# Patient Record
Sex: Male | Born: 1940
Health system: Southern US, Community
[De-identification: ages and names within clinical notes are randomized; demographics above are authoritative.]

## PROBLEM LIST (undated history)

## (undated) DIAGNOSIS — E11621 Type 2 diabetes mellitus with foot ulcer: Secondary | ICD-10-CM

## (undated) DIAGNOSIS — D509 Iron deficiency anemia, unspecified: Secondary | ICD-10-CM

## (undated) DIAGNOSIS — L97509 Non-pressure chronic ulcer of other part of unspecified foot with unspecified severity: Secondary | ICD-10-CM

## (undated) DIAGNOSIS — I10 Essential (primary) hypertension: Secondary | ICD-10-CM

## (undated) DIAGNOSIS — E119 Type 2 diabetes mellitus without complications: Secondary | ICD-10-CM

## (undated) DIAGNOSIS — K635 Polyp of colon: Secondary | ICD-10-CM

## (undated) DIAGNOSIS — D519 Vitamin B12 deficiency anemia, unspecified: Secondary | ICD-10-CM

## (undated) DIAGNOSIS — E785 Hyperlipidemia, unspecified: Secondary | ICD-10-CM

## (undated) DIAGNOSIS — K5790 Diverticulosis of intestine, part unspecified, without perforation or abscess without bleeding: Secondary | ICD-10-CM

## (undated) HISTORY — DX: Vitamin B12 deficiency anemia, unspecified: D51.9

## (undated) HISTORY — DX: Hyperlipidemia, unspecified: E78.5

## (undated) HISTORY — DX: Polyp of colon: K63.5

## (undated) HISTORY — DX: Type 2 diabetes mellitus with foot ulcer: L97.509

## (undated) HISTORY — DX: Essential (primary) hypertension: I10

## (undated) HISTORY — PX: COLONOSCOPY: SHX174

## (undated) HISTORY — DX: Iron deficiency anemia, unspecified: D50.9

## (undated) HISTORY — PX: TONSILLECTOMY: SUR1361

## (undated) HISTORY — DX: Diverticulosis of intestine, part unspecified, without perforation or abscess without bleeding: K57.90

## (undated) HISTORY — DX: Type 2 diabetes mellitus with foot ulcer: E11.621

## (undated) HISTORY — DX: Type 2 diabetes mellitus without complications: E11.9

---

## 2011-12-09 DIAGNOSIS — D485 Neoplasm of uncertain behavior of skin: Secondary | ICD-10-CM | POA: Diagnosis not present

## 2012-02-20 DIAGNOSIS — E785 Hyperlipidemia, unspecified: Secondary | ICD-10-CM | POA: Diagnosis not present

## 2012-02-20 DIAGNOSIS — I10 Essential (primary) hypertension: Secondary | ICD-10-CM | POA: Diagnosis not present

## 2012-02-20 DIAGNOSIS — Z6829 Body mass index (BMI) 29.0-29.9, adult: Secondary | ICD-10-CM | POA: Diagnosis not present

## 2012-02-20 DIAGNOSIS — Z79899 Other long term (current) drug therapy: Secondary | ICD-10-CM | POA: Diagnosis not present

## 2012-02-20 DIAGNOSIS — N529 Male erectile dysfunction, unspecified: Secondary | ICD-10-CM | POA: Diagnosis not present

## 2012-05-23 DIAGNOSIS — I10 Essential (primary) hypertension: Secondary | ICD-10-CM | POA: Diagnosis not present

## 2012-05-23 DIAGNOSIS — E785 Hyperlipidemia, unspecified: Secondary | ICD-10-CM | POA: Diagnosis not present

## 2012-05-23 DIAGNOSIS — Z6829 Body mass index (BMI) 29.0-29.9, adult: Secondary | ICD-10-CM | POA: Diagnosis not present

## 2012-05-24 DIAGNOSIS — L570 Actinic keratosis: Secondary | ICD-10-CM | POA: Diagnosis not present

## 2012-08-16 DIAGNOSIS — Z23 Encounter for immunization: Secondary | ICD-10-CM | POA: Diagnosis not present

## 2012-09-11 DIAGNOSIS — I10 Essential (primary) hypertension: Secondary | ICD-10-CM | POA: Diagnosis not present

## 2012-09-11 DIAGNOSIS — E785 Hyperlipidemia, unspecified: Secondary | ICD-10-CM | POA: Diagnosis not present

## 2012-09-11 DIAGNOSIS — Z6829 Body mass index (BMI) 29.0-29.9, adult: Secondary | ICD-10-CM | POA: Diagnosis not present

## 2012-09-18 DIAGNOSIS — Z6829 Body mass index (BMI) 29.0-29.9, adult: Secondary | ICD-10-CM | POA: Diagnosis not present

## 2013-01-07 DIAGNOSIS — Z9181 History of falling: Secondary | ICD-10-CM | POA: Diagnosis not present

## 2013-01-07 DIAGNOSIS — E785 Hyperlipidemia, unspecified: Secondary | ICD-10-CM | POA: Diagnosis not present

## 2013-01-07 DIAGNOSIS — I10 Essential (primary) hypertension: Secondary | ICD-10-CM | POA: Diagnosis not present

## 2013-01-07 DIAGNOSIS — Z1331 Encounter for screening for depression: Secondary | ICD-10-CM | POA: Diagnosis not present

## 2013-02-19 DIAGNOSIS — D235 Other benign neoplasm of skin of trunk: Secondary | ICD-10-CM | POA: Diagnosis not present

## 2013-02-19 DIAGNOSIS — L821 Other seborrheic keratosis: Secondary | ICD-10-CM | POA: Diagnosis not present

## 2013-02-19 DIAGNOSIS — L570 Actinic keratosis: Secondary | ICD-10-CM | POA: Diagnosis not present

## 2013-03-25 DIAGNOSIS — Z86010 Personal history of colonic polyps: Secondary | ICD-10-CM | POA: Diagnosis not present

## 2013-04-25 DIAGNOSIS — Z79899 Other long term (current) drug therapy: Secondary | ICD-10-CM | POA: Diagnosis not present

## 2013-04-25 DIAGNOSIS — I10 Essential (primary) hypertension: Secondary | ICD-10-CM | POA: Diagnosis not present

## 2013-04-25 DIAGNOSIS — E7800 Pure hypercholesterolemia, unspecified: Secondary | ICD-10-CM | POA: Diagnosis not present

## 2013-04-25 DIAGNOSIS — Z86010 Personal history of colonic polyps: Secondary | ICD-10-CM | POA: Diagnosis not present

## 2013-04-25 DIAGNOSIS — E119 Type 2 diabetes mellitus without complications: Secondary | ICD-10-CM | POA: Diagnosis not present

## 2013-04-25 DIAGNOSIS — Z7982 Long term (current) use of aspirin: Secondary | ICD-10-CM | POA: Diagnosis not present

## 2013-04-25 DIAGNOSIS — Z1211 Encounter for screening for malignant neoplasm of colon: Secondary | ICD-10-CM | POA: Diagnosis not present

## 2013-04-30 DIAGNOSIS — E785 Hyperlipidemia, unspecified: Secondary | ICD-10-CM | POA: Diagnosis not present

## 2013-04-30 DIAGNOSIS — Z1331 Encounter for screening for depression: Secondary | ICD-10-CM | POA: Diagnosis not present

## 2013-04-30 DIAGNOSIS — I10 Essential (primary) hypertension: Secondary | ICD-10-CM | POA: Diagnosis not present

## 2013-04-30 DIAGNOSIS — Z9181 History of falling: Secondary | ICD-10-CM | POA: Diagnosis not present

## 2013-08-08 DIAGNOSIS — I10 Essential (primary) hypertension: Secondary | ICD-10-CM | POA: Diagnosis not present

## 2013-08-08 DIAGNOSIS — Z6828 Body mass index (BMI) 28.0-28.9, adult: Secondary | ICD-10-CM | POA: Diagnosis not present

## 2013-08-08 DIAGNOSIS — E785 Hyperlipidemia, unspecified: Secondary | ICD-10-CM | POA: Diagnosis not present

## 2013-09-11 DIAGNOSIS — Z23 Encounter for immunization: Secondary | ICD-10-CM | POA: Diagnosis not present

## 2013-11-11 DIAGNOSIS — Z125 Encounter for screening for malignant neoplasm of prostate: Secondary | ICD-10-CM | POA: Diagnosis not present

## 2013-11-11 DIAGNOSIS — E785 Hyperlipidemia, unspecified: Secondary | ICD-10-CM | POA: Diagnosis not present

## 2013-11-11 DIAGNOSIS — I10 Essential (primary) hypertension: Secondary | ICD-10-CM | POA: Diagnosis not present

## 2014-02-10 DIAGNOSIS — IMO0001 Reserved for inherently not codable concepts without codable children: Secondary | ICD-10-CM | POA: Diagnosis not present

## 2014-02-10 DIAGNOSIS — I10 Essential (primary) hypertension: Secondary | ICD-10-CM | POA: Diagnosis not present

## 2014-02-10 DIAGNOSIS — E785 Hyperlipidemia, unspecified: Secondary | ICD-10-CM | POA: Diagnosis not present

## 2014-02-13 DIAGNOSIS — IMO0001 Reserved for inherently not codable concepts without codable children: Secondary | ICD-10-CM | POA: Diagnosis not present

## 2014-02-13 DIAGNOSIS — E785 Hyperlipidemia, unspecified: Secondary | ICD-10-CM | POA: Diagnosis not present

## 2014-02-13 DIAGNOSIS — I10 Essential (primary) hypertension: Secondary | ICD-10-CM | POA: Diagnosis not present

## 2014-02-13 DIAGNOSIS — Z6828 Body mass index (BMI) 28.0-28.9, adult: Secondary | ICD-10-CM | POA: Diagnosis not present

## 2014-02-27 DIAGNOSIS — I10 Essential (primary) hypertension: Secondary | ICD-10-CM | POA: Diagnosis not present

## 2014-02-27 DIAGNOSIS — IMO0001 Reserved for inherently not codable concepts without codable children: Secondary | ICD-10-CM | POA: Diagnosis not present

## 2014-02-27 DIAGNOSIS — Z6828 Body mass index (BMI) 28.0-28.9, adult: Secondary | ICD-10-CM | POA: Diagnosis not present

## 2014-02-27 DIAGNOSIS — E785 Hyperlipidemia, unspecified: Secondary | ICD-10-CM | POA: Diagnosis not present

## 2014-02-28 DIAGNOSIS — L821 Other seborrheic keratosis: Secondary | ICD-10-CM | POA: Diagnosis not present

## 2014-02-28 DIAGNOSIS — L570 Actinic keratosis: Secondary | ICD-10-CM | POA: Diagnosis not present

## 2014-05-21 DIAGNOSIS — E1149 Type 2 diabetes mellitus with other diabetic neurological complication: Secondary | ICD-10-CM | POA: Diagnosis not present

## 2014-05-21 DIAGNOSIS — E785 Hyperlipidemia, unspecified: Secondary | ICD-10-CM | POA: Diagnosis not present

## 2014-05-26 DIAGNOSIS — Z6828 Body mass index (BMI) 28.0-28.9, adult: Secondary | ICD-10-CM | POA: Diagnosis not present

## 2014-05-26 DIAGNOSIS — E785 Hyperlipidemia, unspecified: Secondary | ICD-10-CM | POA: Diagnosis not present

## 2014-05-26 DIAGNOSIS — Z9181 History of falling: Secondary | ICD-10-CM | POA: Diagnosis not present

## 2014-05-26 DIAGNOSIS — I10 Essential (primary) hypertension: Secondary | ICD-10-CM | POA: Diagnosis not present

## 2014-05-26 DIAGNOSIS — E1149 Type 2 diabetes mellitus with other diabetic neurological complication: Secondary | ICD-10-CM | POA: Diagnosis not present

## 2014-05-26 DIAGNOSIS — Z1331 Encounter for screening for depression: Secondary | ICD-10-CM | POA: Diagnosis not present

## 2014-08-28 DIAGNOSIS — E1165 Type 2 diabetes mellitus with hyperglycemia: Secondary | ICD-10-CM | POA: Diagnosis not present

## 2014-08-28 DIAGNOSIS — E119 Type 2 diabetes mellitus without complications: Secondary | ICD-10-CM | POA: Diagnosis not present

## 2014-08-28 DIAGNOSIS — E785 Hyperlipidemia, unspecified: Secondary | ICD-10-CM | POA: Diagnosis not present

## 2014-09-01 DIAGNOSIS — Z23 Encounter for immunization: Secondary | ICD-10-CM | POA: Diagnosis not present

## 2014-09-01 DIAGNOSIS — E1140 Type 2 diabetes mellitus with diabetic neuropathy, unspecified: Secondary | ICD-10-CM | POA: Diagnosis not present

## 2014-09-01 DIAGNOSIS — E785 Hyperlipidemia, unspecified: Secondary | ICD-10-CM | POA: Diagnosis not present

## 2014-09-01 DIAGNOSIS — Z6827 Body mass index (BMI) 27.0-27.9, adult: Secondary | ICD-10-CM | POA: Diagnosis not present

## 2014-09-01 DIAGNOSIS — I10 Essential (primary) hypertension: Secondary | ICD-10-CM | POA: Diagnosis not present

## 2014-12-09 DIAGNOSIS — E1140 Type 2 diabetes mellitus with diabetic neuropathy, unspecified: Secondary | ICD-10-CM | POA: Diagnosis not present

## 2014-12-09 DIAGNOSIS — Z125 Encounter for screening for malignant neoplasm of prostate: Secondary | ICD-10-CM | POA: Diagnosis not present

## 2014-12-09 DIAGNOSIS — E785 Hyperlipidemia, unspecified: Secondary | ICD-10-CM | POA: Diagnosis not present

## 2014-12-11 DIAGNOSIS — Z23 Encounter for immunization: Secondary | ICD-10-CM | POA: Diagnosis not present

## 2014-12-11 DIAGNOSIS — E785 Hyperlipidemia, unspecified: Secondary | ICD-10-CM | POA: Diagnosis not present

## 2014-12-11 DIAGNOSIS — Z760 Encounter for issue of repeat prescription: Secondary | ICD-10-CM | POA: Diagnosis not present

## 2014-12-11 DIAGNOSIS — E1140 Type 2 diabetes mellitus with diabetic neuropathy, unspecified: Secondary | ICD-10-CM | POA: Diagnosis not present

## 2014-12-11 DIAGNOSIS — Z6828 Body mass index (BMI) 28.0-28.9, adult: Secondary | ICD-10-CM | POA: Diagnosis not present

## 2014-12-11 DIAGNOSIS — I10 Essential (primary) hypertension: Secondary | ICD-10-CM | POA: Diagnosis not present

## 2015-03-03 DIAGNOSIS — L570 Actinic keratosis: Secondary | ICD-10-CM | POA: Diagnosis not present

## 2015-03-16 DIAGNOSIS — E785 Hyperlipidemia, unspecified: Secondary | ICD-10-CM | POA: Diagnosis not present

## 2015-03-16 DIAGNOSIS — E1140 Type 2 diabetes mellitus with diabetic neuropathy, unspecified: Secondary | ICD-10-CM | POA: Diagnosis not present

## 2015-03-18 DIAGNOSIS — E785 Hyperlipidemia, unspecified: Secondary | ICD-10-CM | POA: Diagnosis not present

## 2015-03-18 DIAGNOSIS — Z6828 Body mass index (BMI) 28.0-28.9, adult: Secondary | ICD-10-CM | POA: Diagnosis not present

## 2015-03-18 DIAGNOSIS — E1140 Type 2 diabetes mellitus with diabetic neuropathy, unspecified: Secondary | ICD-10-CM | POA: Diagnosis not present

## 2015-03-18 DIAGNOSIS — I10 Essential (primary) hypertension: Secondary | ICD-10-CM | POA: Diagnosis not present

## 2015-03-18 DIAGNOSIS — Z1389 Encounter for screening for other disorder: Secondary | ICD-10-CM | POA: Diagnosis not present

## 2015-06-23 DIAGNOSIS — E785 Hyperlipidemia, unspecified: Secondary | ICD-10-CM | POA: Diagnosis not present

## 2015-06-23 DIAGNOSIS — E1140 Type 2 diabetes mellitus with diabetic neuropathy, unspecified: Secondary | ICD-10-CM | POA: Diagnosis not present

## 2015-06-24 DIAGNOSIS — Z1389 Encounter for screening for other disorder: Secondary | ICD-10-CM | POA: Diagnosis not present

## 2015-06-24 DIAGNOSIS — E1140 Type 2 diabetes mellitus with diabetic neuropathy, unspecified: Secondary | ICD-10-CM | POA: Diagnosis not present

## 2015-06-24 DIAGNOSIS — I10 Essential (primary) hypertension: Secondary | ICD-10-CM | POA: Diagnosis not present

## 2015-06-24 DIAGNOSIS — Z9181 History of falling: Secondary | ICD-10-CM | POA: Diagnosis not present

## 2015-06-24 DIAGNOSIS — Z6828 Body mass index (BMI) 28.0-28.9, adult: Secondary | ICD-10-CM | POA: Diagnosis not present

## 2015-06-24 DIAGNOSIS — E785 Hyperlipidemia, unspecified: Secondary | ICD-10-CM | POA: Diagnosis not present

## 2015-09-28 DIAGNOSIS — E785 Hyperlipidemia, unspecified: Secondary | ICD-10-CM | POA: Diagnosis not present

## 2015-09-28 DIAGNOSIS — E1140 Type 2 diabetes mellitus with diabetic neuropathy, unspecified: Secondary | ICD-10-CM | POA: Diagnosis not present

## 2015-09-30 DIAGNOSIS — E785 Hyperlipidemia, unspecified: Secondary | ICD-10-CM | POA: Diagnosis not present

## 2015-09-30 DIAGNOSIS — E1140 Type 2 diabetes mellitus with diabetic neuropathy, unspecified: Secondary | ICD-10-CM | POA: Diagnosis not present

## 2015-09-30 DIAGNOSIS — Z6828 Body mass index (BMI) 28.0-28.9, adult: Secondary | ICD-10-CM | POA: Diagnosis not present

## 2015-09-30 DIAGNOSIS — Z23 Encounter for immunization: Secondary | ICD-10-CM | POA: Diagnosis not present

## 2015-09-30 DIAGNOSIS — I10 Essential (primary) hypertension: Secondary | ICD-10-CM | POA: Diagnosis not present

## 2016-01-05 DIAGNOSIS — E785 Hyperlipidemia, unspecified: Secondary | ICD-10-CM | POA: Diagnosis not present

## 2016-01-05 DIAGNOSIS — E1140 Type 2 diabetes mellitus with diabetic neuropathy, unspecified: Secondary | ICD-10-CM | POA: Diagnosis not present

## 2016-01-05 DIAGNOSIS — Z125 Encounter for screening for malignant neoplasm of prostate: Secondary | ICD-10-CM | POA: Diagnosis not present

## 2016-01-07 DIAGNOSIS — Z6828 Body mass index (BMI) 28.0-28.9, adult: Secondary | ICD-10-CM | POA: Diagnosis not present

## 2016-01-07 DIAGNOSIS — E663 Overweight: Secondary | ICD-10-CM | POA: Diagnosis not present

## 2016-01-07 DIAGNOSIS — I10 Essential (primary) hypertension: Secondary | ICD-10-CM | POA: Diagnosis not present

## 2016-01-07 DIAGNOSIS — E1140 Type 2 diabetes mellitus with diabetic neuropathy, unspecified: Secondary | ICD-10-CM | POA: Diagnosis not present

## 2016-01-07 DIAGNOSIS — E785 Hyperlipidemia, unspecified: Secondary | ICD-10-CM | POA: Diagnosis not present

## 2016-04-12 DIAGNOSIS — L570 Actinic keratosis: Secondary | ICD-10-CM | POA: Diagnosis not present

## 2016-04-12 DIAGNOSIS — E785 Hyperlipidemia, unspecified: Secondary | ICD-10-CM | POA: Diagnosis not present

## 2016-04-12 DIAGNOSIS — E1140 Type 2 diabetes mellitus with diabetic neuropathy, unspecified: Secondary | ICD-10-CM | POA: Diagnosis not present

## 2016-04-12 DIAGNOSIS — L821 Other seborrheic keratosis: Secondary | ICD-10-CM | POA: Diagnosis not present

## 2016-04-14 DIAGNOSIS — E785 Hyperlipidemia, unspecified: Secondary | ICD-10-CM | POA: Diagnosis not present

## 2016-04-14 DIAGNOSIS — E1140 Type 2 diabetes mellitus with diabetic neuropathy, unspecified: Secondary | ICD-10-CM | POA: Diagnosis not present

## 2016-04-14 DIAGNOSIS — I10 Essential (primary) hypertension: Secondary | ICD-10-CM | POA: Diagnosis not present

## 2016-04-14 DIAGNOSIS — Z6828 Body mass index (BMI) 28.0-28.9, adult: Secondary | ICD-10-CM | POA: Diagnosis not present

## 2016-07-19 DIAGNOSIS — E785 Hyperlipidemia, unspecified: Secondary | ICD-10-CM | POA: Diagnosis not present

## 2016-07-19 DIAGNOSIS — E1140 Type 2 diabetes mellitus with diabetic neuropathy, unspecified: Secondary | ICD-10-CM | POA: Diagnosis not present

## 2016-07-21 DIAGNOSIS — I10 Essential (primary) hypertension: Secondary | ICD-10-CM | POA: Diagnosis not present

## 2016-07-21 DIAGNOSIS — E785 Hyperlipidemia, unspecified: Secondary | ICD-10-CM | POA: Diagnosis not present

## 2016-07-21 DIAGNOSIS — Z9181 History of falling: Secondary | ICD-10-CM | POA: Diagnosis not present

## 2016-07-21 DIAGNOSIS — E1140 Type 2 diabetes mellitus with diabetic neuropathy, unspecified: Secondary | ICD-10-CM | POA: Diagnosis not present

## 2016-07-21 DIAGNOSIS — Z1389 Encounter for screening for other disorder: Secondary | ICD-10-CM | POA: Diagnosis not present

## 2016-07-25 DIAGNOSIS — Z23 Encounter for immunization: Secondary | ICD-10-CM | POA: Diagnosis not present

## 2016-10-24 DIAGNOSIS — E785 Hyperlipidemia, unspecified: Secondary | ICD-10-CM | POA: Diagnosis not present

## 2016-10-24 DIAGNOSIS — E1140 Type 2 diabetes mellitus with diabetic neuropathy, unspecified: Secondary | ICD-10-CM | POA: Diagnosis not present

## 2016-10-27 DIAGNOSIS — I10 Essential (primary) hypertension: Secondary | ICD-10-CM | POA: Diagnosis not present

## 2016-10-27 DIAGNOSIS — E1140 Type 2 diabetes mellitus with diabetic neuropathy, unspecified: Secondary | ICD-10-CM | POA: Diagnosis not present

## 2016-10-27 DIAGNOSIS — E785 Hyperlipidemia, unspecified: Secondary | ICD-10-CM | POA: Diagnosis not present

## 2016-12-12 DIAGNOSIS — Z125 Encounter for screening for malignant neoplasm of prostate: Secondary | ICD-10-CM | POA: Diagnosis not present

## 2016-12-12 DIAGNOSIS — Z136 Encounter for screening for cardiovascular disorders: Secondary | ICD-10-CM | POA: Diagnosis not present

## 2016-12-12 DIAGNOSIS — Z0000 Encounter for general adult medical examination without abnormal findings: Secondary | ICD-10-CM | POA: Diagnosis not present

## 2016-12-12 DIAGNOSIS — Z1211 Encounter for screening for malignant neoplasm of colon: Secondary | ICD-10-CM | POA: Diagnosis not present

## 2016-12-12 DIAGNOSIS — Z1389 Encounter for screening for other disorder: Secondary | ICD-10-CM | POA: Diagnosis not present

## 2016-12-12 DIAGNOSIS — Z9181 History of falling: Secondary | ICD-10-CM | POA: Diagnosis not present

## 2017-01-30 DIAGNOSIS — Z125 Encounter for screening for malignant neoplasm of prostate: Secondary | ICD-10-CM | POA: Diagnosis not present

## 2017-01-30 DIAGNOSIS — E1140 Type 2 diabetes mellitus with diabetic neuropathy, unspecified: Secondary | ICD-10-CM | POA: Diagnosis not present

## 2017-01-30 DIAGNOSIS — E785 Hyperlipidemia, unspecified: Secondary | ICD-10-CM | POA: Diagnosis not present

## 2017-02-02 DIAGNOSIS — I10 Essential (primary) hypertension: Secondary | ICD-10-CM | POA: Diagnosis not present

## 2017-02-02 DIAGNOSIS — Z6828 Body mass index (BMI) 28.0-28.9, adult: Secondary | ICD-10-CM | POA: Diagnosis not present

## 2017-02-02 DIAGNOSIS — E1140 Type 2 diabetes mellitus with diabetic neuropathy, unspecified: Secondary | ICD-10-CM | POA: Diagnosis not present

## 2017-02-02 DIAGNOSIS — E663 Overweight: Secondary | ICD-10-CM | POA: Diagnosis not present

## 2017-02-02 DIAGNOSIS — E785 Hyperlipidemia, unspecified: Secondary | ICD-10-CM | POA: Diagnosis not present

## 2017-04-12 DIAGNOSIS — L821 Other seborrheic keratosis: Secondary | ICD-10-CM | POA: Diagnosis not present

## 2017-04-12 DIAGNOSIS — L570 Actinic keratosis: Secondary | ICD-10-CM | POA: Diagnosis not present

## 2017-05-09 DIAGNOSIS — E1140 Type 2 diabetes mellitus with diabetic neuropathy, unspecified: Secondary | ICD-10-CM | POA: Diagnosis not present

## 2017-05-09 DIAGNOSIS — E785 Hyperlipidemia, unspecified: Secondary | ICD-10-CM | POA: Diagnosis not present

## 2017-05-11 DIAGNOSIS — E785 Hyperlipidemia, unspecified: Secondary | ICD-10-CM | POA: Diagnosis not present

## 2017-05-11 DIAGNOSIS — I10 Essential (primary) hypertension: Secondary | ICD-10-CM | POA: Diagnosis not present

## 2017-05-11 DIAGNOSIS — E1140 Type 2 diabetes mellitus with diabetic neuropathy, unspecified: Secondary | ICD-10-CM | POA: Diagnosis not present

## 2017-05-11 DIAGNOSIS — Z6828 Body mass index (BMI) 28.0-28.9, adult: Secondary | ICD-10-CM | POA: Diagnosis not present

## 2017-08-11 DIAGNOSIS — E1140 Type 2 diabetes mellitus with diabetic neuropathy, unspecified: Secondary | ICD-10-CM | POA: Diagnosis not present

## 2017-08-11 DIAGNOSIS — E785 Hyperlipidemia, unspecified: Secondary | ICD-10-CM | POA: Diagnosis not present

## 2017-08-15 DIAGNOSIS — Z9181 History of falling: Secondary | ICD-10-CM | POA: Diagnosis not present

## 2017-08-15 DIAGNOSIS — Z6827 Body mass index (BMI) 27.0-27.9, adult: Secondary | ICD-10-CM | POA: Diagnosis not present

## 2017-08-15 DIAGNOSIS — E785 Hyperlipidemia, unspecified: Secondary | ICD-10-CM | POA: Diagnosis not present

## 2017-08-15 DIAGNOSIS — E1140 Type 2 diabetes mellitus with diabetic neuropathy, unspecified: Secondary | ICD-10-CM | POA: Diagnosis not present

## 2017-08-15 DIAGNOSIS — I10 Essential (primary) hypertension: Secondary | ICD-10-CM | POA: Diagnosis not present

## 2017-08-17 DIAGNOSIS — Z23 Encounter for immunization: Secondary | ICD-10-CM | POA: Diagnosis not present

## 2017-11-15 DIAGNOSIS — E785 Hyperlipidemia, unspecified: Secondary | ICD-10-CM | POA: Diagnosis not present

## 2017-11-15 DIAGNOSIS — E1140 Type 2 diabetes mellitus with diabetic neuropathy, unspecified: Secondary | ICD-10-CM | POA: Diagnosis not present

## 2017-11-17 DIAGNOSIS — E1140 Type 2 diabetes mellitus with diabetic neuropathy, unspecified: Secondary | ICD-10-CM | POA: Diagnosis not present

## 2017-11-17 DIAGNOSIS — Z6828 Body mass index (BMI) 28.0-28.9, adult: Secondary | ICD-10-CM | POA: Diagnosis not present

## 2017-11-17 DIAGNOSIS — E785 Hyperlipidemia, unspecified: Secondary | ICD-10-CM | POA: Diagnosis not present

## 2017-11-17 DIAGNOSIS — I10 Essential (primary) hypertension: Secondary | ICD-10-CM | POA: Diagnosis not present

## 2018-02-14 DIAGNOSIS — E1140 Type 2 diabetes mellitus with diabetic neuropathy, unspecified: Secondary | ICD-10-CM | POA: Diagnosis not present

## 2018-02-14 DIAGNOSIS — E785 Hyperlipidemia, unspecified: Secondary | ICD-10-CM | POA: Diagnosis not present

## 2018-02-14 DIAGNOSIS — Z125 Encounter for screening for malignant neoplasm of prostate: Secondary | ICD-10-CM | POA: Diagnosis not present

## 2018-02-16 DIAGNOSIS — Z9181 History of falling: Secondary | ICD-10-CM | POA: Diagnosis not present

## 2018-02-16 DIAGNOSIS — E785 Hyperlipidemia, unspecified: Secondary | ICD-10-CM | POA: Diagnosis not present

## 2018-02-16 DIAGNOSIS — Z1331 Encounter for screening for depression: Secondary | ICD-10-CM | POA: Diagnosis not present

## 2018-02-16 DIAGNOSIS — E1140 Type 2 diabetes mellitus with diabetic neuropathy, unspecified: Secondary | ICD-10-CM | POA: Diagnosis not present

## 2018-02-16 DIAGNOSIS — Z1389 Encounter for screening for other disorder: Secondary | ICD-10-CM | POA: Diagnosis not present

## 2018-02-16 DIAGNOSIS — I10 Essential (primary) hypertension: Secondary | ICD-10-CM | POA: Diagnosis not present

## 2018-04-10 DIAGNOSIS — L570 Actinic keratosis: Secondary | ICD-10-CM | POA: Diagnosis not present

## 2018-05-09 DIAGNOSIS — Z86010 Personal history of colonic polyps: Secondary | ICD-10-CM | POA: Diagnosis not present

## 2018-05-09 DIAGNOSIS — E119 Type 2 diabetes mellitus without complications: Secondary | ICD-10-CM | POA: Diagnosis not present

## 2018-05-09 DIAGNOSIS — Z01818 Encounter for other preprocedural examination: Secondary | ICD-10-CM | POA: Diagnosis not present

## 2018-05-22 DIAGNOSIS — E1140 Type 2 diabetes mellitus with diabetic neuropathy, unspecified: Secondary | ICD-10-CM | POA: Diagnosis not present

## 2018-05-22 DIAGNOSIS — E785 Hyperlipidemia, unspecified: Secondary | ICD-10-CM | POA: Diagnosis not present

## 2018-05-24 DIAGNOSIS — E1140 Type 2 diabetes mellitus with diabetic neuropathy, unspecified: Secondary | ICD-10-CM | POA: Diagnosis not present

## 2018-05-24 DIAGNOSIS — I10 Essential (primary) hypertension: Secondary | ICD-10-CM | POA: Diagnosis not present

## 2018-05-24 DIAGNOSIS — E785 Hyperlipidemia, unspecified: Secondary | ICD-10-CM | POA: Diagnosis not present

## 2018-05-31 DIAGNOSIS — Z794 Long term (current) use of insulin: Secondary | ICD-10-CM | POA: Diagnosis not present

## 2018-05-31 DIAGNOSIS — K648 Other hemorrhoids: Secondary | ICD-10-CM | POA: Diagnosis not present

## 2018-05-31 DIAGNOSIS — Z85828 Personal history of other malignant neoplasm of skin: Secondary | ICD-10-CM | POA: Diagnosis not present

## 2018-05-31 DIAGNOSIS — E7800 Pure hypercholesterolemia, unspecified: Secondary | ICD-10-CM | POA: Diagnosis not present

## 2018-05-31 DIAGNOSIS — Z09 Encounter for follow-up examination after completed treatment for conditions other than malignant neoplasm: Secondary | ICD-10-CM | POA: Diagnosis not present

## 2018-05-31 DIAGNOSIS — N4289 Other specified disorders of prostate: Secondary | ICD-10-CM | POA: Diagnosis not present

## 2018-05-31 DIAGNOSIS — Z79899 Other long term (current) drug therapy: Secondary | ICD-10-CM | POA: Diagnosis not present

## 2018-05-31 DIAGNOSIS — Z1211 Encounter for screening for malignant neoplasm of colon: Secondary | ICD-10-CM | POA: Diagnosis not present

## 2018-05-31 DIAGNOSIS — K5730 Diverticulosis of large intestine without perforation or abscess without bleeding: Secondary | ICD-10-CM | POA: Diagnosis not present

## 2018-05-31 DIAGNOSIS — I10 Essential (primary) hypertension: Secondary | ICD-10-CM | POA: Diagnosis not present

## 2018-05-31 DIAGNOSIS — E119 Type 2 diabetes mellitus without complications: Secondary | ICD-10-CM | POA: Diagnosis not present

## 2018-05-31 DIAGNOSIS — K644 Residual hemorrhoidal skin tags: Secondary | ICD-10-CM | POA: Diagnosis not present

## 2018-05-31 DIAGNOSIS — Z7982 Long term (current) use of aspirin: Secondary | ICD-10-CM | POA: Diagnosis not present

## 2018-05-31 DIAGNOSIS — Z86010 Personal history of colonic polyps: Secondary | ICD-10-CM | POA: Diagnosis not present

## 2018-08-28 DIAGNOSIS — E785 Hyperlipidemia, unspecified: Secondary | ICD-10-CM | POA: Diagnosis not present

## 2018-08-28 DIAGNOSIS — E1140 Type 2 diabetes mellitus with diabetic neuropathy, unspecified: Secondary | ICD-10-CM | POA: Diagnosis not present

## 2018-08-30 DIAGNOSIS — I10 Essential (primary) hypertension: Secondary | ICD-10-CM | POA: Diagnosis not present

## 2018-08-30 DIAGNOSIS — E785 Hyperlipidemia, unspecified: Secondary | ICD-10-CM | POA: Diagnosis not present

## 2018-08-30 DIAGNOSIS — Z23 Encounter for immunization: Secondary | ICD-10-CM | POA: Diagnosis not present

## 2018-08-30 DIAGNOSIS — E1140 Type 2 diabetes mellitus with diabetic neuropathy, unspecified: Secondary | ICD-10-CM | POA: Diagnosis not present

## 2018-10-09 DIAGNOSIS — J208 Acute bronchitis due to other specified organisms: Secondary | ICD-10-CM | POA: Diagnosis not present

## 2018-10-16 DIAGNOSIS — R634 Abnormal weight loss: Secondary | ICD-10-CM | POA: Diagnosis not present

## 2018-10-16 DIAGNOSIS — R05 Cough: Secondary | ICD-10-CM | POA: Diagnosis not present

## 2018-10-16 DIAGNOSIS — J208 Acute bronchitis due to other specified organisms: Secondary | ICD-10-CM | POA: Diagnosis not present

## 2018-10-16 DIAGNOSIS — R0602 Shortness of breath: Secondary | ICD-10-CM | POA: Diagnosis not present

## 2018-11-28 DIAGNOSIS — E785 Hyperlipidemia, unspecified: Secondary | ICD-10-CM | POA: Diagnosis not present

## 2018-11-28 DIAGNOSIS — E1140 Type 2 diabetes mellitus with diabetic neuropathy, unspecified: Secondary | ICD-10-CM | POA: Diagnosis not present

## 2018-11-30 DIAGNOSIS — Z6828 Body mass index (BMI) 28.0-28.9, adult: Secondary | ICD-10-CM | POA: Diagnosis not present

## 2018-11-30 DIAGNOSIS — I10 Essential (primary) hypertension: Secondary | ICD-10-CM | POA: Diagnosis not present

## 2018-11-30 DIAGNOSIS — E1140 Type 2 diabetes mellitus with diabetic neuropathy, unspecified: Secondary | ICD-10-CM | POA: Diagnosis not present

## 2018-11-30 DIAGNOSIS — E785 Hyperlipidemia, unspecified: Secondary | ICD-10-CM | POA: Diagnosis not present

## 2019-04-11 DIAGNOSIS — L821 Other seborrheic keratosis: Secondary | ICD-10-CM | POA: Diagnosis not present

## 2019-04-11 DIAGNOSIS — L570 Actinic keratosis: Secondary | ICD-10-CM | POA: Diagnosis not present

## 2019-04-11 DIAGNOSIS — D225 Melanocytic nevi of trunk: Secondary | ICD-10-CM | POA: Diagnosis not present

## 2019-04-29 ENCOUNTER — Ambulatory Visit (INDEPENDENT_AMBULATORY_CARE_PROVIDER_SITE_OTHER): Payer: Medicare Other | Admitting: Podiatry

## 2019-04-29 ENCOUNTER — Encounter: Payer: Self-pay | Admitting: Podiatry

## 2019-04-29 ENCOUNTER — Ambulatory Visit (INDEPENDENT_AMBULATORY_CARE_PROVIDER_SITE_OTHER): Payer: Medicare Other

## 2019-04-29 ENCOUNTER — Other Ambulatory Visit: Payer: Self-pay | Admitting: Podiatry

## 2019-04-29 ENCOUNTER — Other Ambulatory Visit: Payer: Self-pay

## 2019-04-29 VITALS — Temp 98.1°F

## 2019-04-29 DIAGNOSIS — Z7982 Long term (current) use of aspirin: Secondary | ICD-10-CM | POA: Diagnosis not present

## 2019-04-29 DIAGNOSIS — L97529 Non-pressure chronic ulcer of other part of left foot with unspecified severity: Secondary | ICD-10-CM | POA: Diagnosis present

## 2019-04-29 DIAGNOSIS — E11628 Type 2 diabetes mellitus with other skin complications: Secondary | ICD-10-CM

## 2019-04-29 DIAGNOSIS — Z794 Long term (current) use of insulin: Secondary | ICD-10-CM | POA: Diagnosis not present

## 2019-04-29 DIAGNOSIS — L02612 Cutaneous abscess of left foot: Secondary | ICD-10-CM | POA: Diagnosis not present

## 2019-04-29 DIAGNOSIS — E782 Mixed hyperlipidemia: Secondary | ICD-10-CM | POA: Diagnosis present

## 2019-04-29 DIAGNOSIS — L03032 Cellulitis of left toe: Secondary | ICD-10-CM | POA: Diagnosis not present

## 2019-04-29 DIAGNOSIS — B964 Proteus (mirabilis) (morganii) as the cause of diseases classified elsewhere: Secondary | ICD-10-CM | POA: Diagnosis present

## 2019-04-29 DIAGNOSIS — M868X7 Other osteomyelitis, ankle and foot: Secondary | ICD-10-CM | POA: Diagnosis not present

## 2019-04-29 DIAGNOSIS — L97522 Non-pressure chronic ulcer of other part of left foot with fat layer exposed: Secondary | ICD-10-CM

## 2019-04-29 DIAGNOSIS — L03116 Cellulitis of left lower limb: Secondary | ICD-10-CM | POA: Diagnosis present

## 2019-04-29 DIAGNOSIS — E119 Type 2 diabetes mellitus without complications: Secondary | ICD-10-CM | POA: Diagnosis not present

## 2019-04-29 DIAGNOSIS — I159 Secondary hypertension, unspecified: Secondary | ICD-10-CM

## 2019-04-29 DIAGNOSIS — E1151 Type 2 diabetes mellitus with diabetic peripheral angiopathy without gangrene: Secondary | ICD-10-CM | POA: Diagnosis not present

## 2019-04-29 DIAGNOSIS — I10 Essential (primary) hypertension: Secondary | ICD-10-CM | POA: Diagnosis not present

## 2019-04-29 DIAGNOSIS — E11621 Type 2 diabetes mellitus with foot ulcer: Secondary | ICD-10-CM | POA: Diagnosis not present

## 2019-04-29 DIAGNOSIS — E1142 Type 2 diabetes mellitus with diabetic polyneuropathy: Secondary | ICD-10-CM | POA: Diagnosis not present

## 2019-04-29 DIAGNOSIS — B9561 Methicillin susceptible Staphylococcus aureus infection as the cause of diseases classified elsewhere: Secondary | ICD-10-CM | POA: Diagnosis present

## 2019-04-29 DIAGNOSIS — E1169 Type 2 diabetes mellitus with other specified complication: Secondary | ICD-10-CM | POA: Diagnosis not present

## 2019-04-29 DIAGNOSIS — Z79899 Other long term (current) drug therapy: Secondary | ICD-10-CM | POA: Diagnosis not present

## 2019-04-29 DIAGNOSIS — Z7984 Long term (current) use of oral hypoglycemic drugs: Secondary | ICD-10-CM | POA: Diagnosis not present

## 2019-04-29 DIAGNOSIS — L84 Corns and callosities: Secondary | ICD-10-CM | POA: Diagnosis not present

## 2019-04-29 DIAGNOSIS — M869 Osteomyelitis, unspecified: Secondary | ICD-10-CM | POA: Diagnosis not present

## 2019-04-29 DIAGNOSIS — L089 Local infection of the skin and subcutaneous tissue, unspecified: Secondary | ICD-10-CM

## 2019-04-29 DIAGNOSIS — B950 Streptococcus, group A, as the cause of diseases classified elsewhere: Secondary | ICD-10-CM | POA: Diagnosis present

## 2019-04-29 DIAGNOSIS — K219 Gastro-esophageal reflux disease without esophagitis: Secondary | ICD-10-CM | POA: Diagnosis present

## 2019-04-29 DIAGNOSIS — E785 Hyperlipidemia, unspecified: Secondary | ICD-10-CM | POA: Diagnosis not present

## 2019-04-29 DIAGNOSIS — S91302A Unspecified open wound, left foot, initial encounter: Secondary | ICD-10-CM | POA: Diagnosis not present

## 2019-04-29 DIAGNOSIS — B9689 Other specified bacterial agents as the cause of diseases classified elsewhere: Secondary | ICD-10-CM | POA: Diagnosis not present

## 2019-04-29 NOTE — Progress Notes (Signed)
Quiet  Subjective:  Patient ID: James Norton, male    DOB: [birth year],  MRN: [id number]  Chief Complaint  Patient presents with   Foot Ulcer    Diabetic with callus left sub met 1 that looks to have ulcerated   Toe Pain    red swollen drainage     78 y.o. male presents for evaluation of redness to his left foot.  States he has a callus in this area is been present for quite a while however it started to get red last week and he grew concerned.  Does not have much pain has a lot of numbness to his feet.  States that there has been a little bit of drainage coming from the foot.  Review of Systems: Negative except as noted in the HPI. Denies N/V/F/Ch.  No past medical history on file.  Current Outpatient Medications:    atorvastatin (LIPITOR) 80 MG tablet, , Disp: , Rfl:    LEVEMIR FLEXTOUCH 100 UNIT/ML Pen, , Disp: , Rfl:    lisinopril (ZESTRIL) 10 MG tablet, , Disp: , Rfl:    metFORMIN (GLUCOPHAGE-XR) 500 MG 24 hr tablet, , Disp: , Rfl:   Social History   Tobacco Use  Smoking Status Never Smoker  Smokeless Tobacco Never Used    No Known Allergies Objective:   Vitals:   04/29/19 1504  Temp: 98.1 F (36.7 C)  BP 200/94 Pulse 84  There is no height or weight on file to calculate BMI. Constitutional Well developed. Well nourished.  Vascular Dorsalis pedis pulses palpable bilaterally. Posterior tibial pulses palpable bilaterally. Capillary refill normal to all digits.  No cyanosis or clubbing noted. Pedal hair growth normal.  Neurologic Normal speech. Oriented to person, place, and time. Protective sensation absent  Dermatologic  2 x 2 centimeter ulceration noted about the plantar to the first MPJ with overlying hyperkeratosis with deep probing upon debridement.  The wound probed 5 cm from the plantar aspect of first metatarsal to the dorsal aspect of the foot.  Purulence was expressible through the wound.  Erythema extending to the dorsal first second third  toes.  No ascending lymphangitis.  No soft tissue crepitus; however fluctuance was appreciated.  Orthopedic: No pain to palpation either foot.   Radiographs: Taken and reviewed no osseous erosions noted no subcutaneous emphysema. No acute fractures or dislocations. Assessment:   1. Cellulitis and abscess of toe of left foot   2. Pre-ulcerative calluses   3. Type 2 diabetes mellitus with diabetic foot infection ([place])   4. Ulcer of left foot, with fat layer exposed ([place])   5. Secondary hypertension    Plan:  Patient was evaluated and treated and all questions answered.  Ulcer Left 1st MPJ with Cellulitis, Abscess -Debridement as below. -Dressed with iodoform packing, DSD. -CAM boot dispensed. -Culture taken. -Attempted to direct admit patient to the hospital however he had elevated blood pressure and thus the hospitalist felt he would better benefit from presents to the ED.  Discussed starting patient on empiric IV antibiotics and obtaining an MRI.  Pending results of MRI he will likely benefit from further incision and drainage of the foot.  We will follow patient when he presents to the hospital.  Procedure: incision and drainage of abscess, complex Anesthesia: Lidocaine 1% plain; 78m Instrumentation: 15 blade Technique: Following sterile skin prep with Betadine, two separate fluctuant areas were incised with a 15 blade.  The areas were explored with a hemostat.  Purulence was expressed from each.  The wound located about the first MPJ plantarly was debrided with a 15 blade.  The wound probed from the plantar first metatarsal to the dorsal aspect of the foot. The open areas were then packed with iodoform packing intracellular sterile dressing was applied. Dressing: Dry, sterile, compression dressing. Disposition: Patient tolerated procedure well. Patient to return in 1 week for follow-up.  No follow-ups on file.

## 2019-04-30 ENCOUNTER — Encounter: Payer: Self-pay | Admitting: Podiatry

## 2019-04-30 DIAGNOSIS — L03116 Cellulitis of left lower limb: Secondary | ICD-10-CM | POA: Diagnosis not present

## 2019-05-01 LAB — WOUND CULTURE

## 2019-05-02 ENCOUNTER — Other Ambulatory Visit: Payer: Self-pay | Admitting: Podiatry

## 2019-05-02 DIAGNOSIS — L02612 Cutaneous abscess of left foot: Secondary | ICD-10-CM

## 2019-05-02 DIAGNOSIS — L84 Corns and callosities: Secondary | ICD-10-CM

## 2019-05-02 DIAGNOSIS — L03032 Cellulitis of left toe: Secondary | ICD-10-CM

## 2019-05-02 LAB — WOUND CULTURE

## 2019-05-03 DIAGNOSIS — M79673 Pain in unspecified foot: Secondary | ICD-10-CM

## 2019-05-05 DIAGNOSIS — M79673 Pain in unspecified foot: Secondary | ICD-10-CM

## 2019-05-06 DIAGNOSIS — Z7982 Long term (current) use of aspirin: Secondary | ICD-10-CM | POA: Diagnosis not present

## 2019-05-06 DIAGNOSIS — Z79899 Other long term (current) drug therapy: Secondary | ICD-10-CM | POA: Diagnosis not present

## 2019-05-06 DIAGNOSIS — M868X7 Other osteomyelitis, ankle and foot: Secondary | ICD-10-CM | POA: Diagnosis not present

## 2019-05-06 DIAGNOSIS — E11621 Type 2 diabetes mellitus with foot ulcer: Secondary | ICD-10-CM | POA: Diagnosis not present

## 2019-05-06 DIAGNOSIS — L02612 Cutaneous abscess of left foot: Secondary | ICD-10-CM | POA: Diagnosis not present

## 2019-05-06 DIAGNOSIS — Z7984 Long term (current) use of oral hypoglycemic drugs: Secondary | ICD-10-CM | POA: Diagnosis not present

## 2019-05-06 DIAGNOSIS — E782 Mixed hyperlipidemia: Secondary | ICD-10-CM | POA: Diagnosis not present

## 2019-05-06 DIAGNOSIS — Z794 Long term (current) use of insulin: Secondary | ICD-10-CM | POA: Diagnosis not present

## 2019-05-06 DIAGNOSIS — I10 Essential (primary) hypertension: Secondary | ICD-10-CM | POA: Diagnosis not present

## 2019-05-06 DIAGNOSIS — B9561 Methicillin susceptible Staphylococcus aureus infection as the cause of diseases classified elsewhere: Secondary | ICD-10-CM | POA: Diagnosis not present

## 2019-05-06 DIAGNOSIS — L97529 Non-pressure chronic ulcer of other part of left foot with unspecified severity: Secondary | ICD-10-CM | POA: Diagnosis not present

## 2019-05-06 DIAGNOSIS — L03116 Cellulitis of left lower limb: Secondary | ICD-10-CM | POA: Diagnosis not present

## 2019-05-07 ENCOUNTER — Encounter: Payer: Self-pay | Admitting: Podiatry

## 2019-05-07 ENCOUNTER — Ambulatory Visit (INDEPENDENT_AMBULATORY_CARE_PROVIDER_SITE_OTHER): Payer: Medicare Other | Admitting: Podiatry

## 2019-05-07 ENCOUNTER — Other Ambulatory Visit: Payer: Self-pay

## 2019-05-07 ENCOUNTER — Other Ambulatory Visit: Payer: Self-pay | Admitting: Podiatry

## 2019-05-07 VITALS — BP 140/85 | HR 87 | Temp 98.7°F | Resp 16

## 2019-05-07 DIAGNOSIS — Z9889 Other specified postprocedural states: Secondary | ICD-10-CM

## 2019-05-07 DIAGNOSIS — M79672 Pain in left foot: Secondary | ICD-10-CM

## 2019-05-07 DIAGNOSIS — L02612 Cutaneous abscess of left foot: Secondary | ICD-10-CM

## 2019-05-07 DIAGNOSIS — L03032 Cellulitis of left toe: Secondary | ICD-10-CM

## 2019-05-07 NOTE — Progress Notes (Signed)
Quiet  Subjective:  Patient ID: James Norton., male    DOB: Jan 28, 1941,  MRN: [id number]  Chief Complaint  Patient presents with  . Routine Post Op    F/U from hospital Pt.s tates," it's been doing alright. The only pain I have is form neuropathy (very little), but pain from sx none." Tx: abx, cam boot, elevation, dressinag and packing from [facility] -pt denie sN/V/F?Ch     78 y.o. male presents for f/u after Incision and drainage 05/01/2019. Doing ok pain controlled. Taking abx as prescribed. On augmentin and levaquin per cultures.  Review of Systems: Negative except as noted in the HPI. Denies N/V/F/Ch.  No past medical history on file.  Current Outpatient Medications:  .  amLODipine (NORVASC) 5 MG tablet, , Disp: , Rfl:  .  amoxicillin-clavulanate (AUGMENTIN) 875-125 MG tablet, , Disp: , Rfl:  .  atorvastatin (LIPITOR) 80 MG tablet, , Disp: , Rfl:  .  LEVEMIR FLEXTOUCH 100 UNIT/ML Pen, , Disp: , Rfl:  .  levofloxacin (LEVAQUIN) 750 MG tablet, , Disp: , Rfl:  .  lisinopril (ZESTRIL) 10 MG tablet, , Disp: , Rfl:  .  lisinopril (ZESTRIL) 20 MG tablet, , Disp: , Rfl:  .  metFORMIN (GLUCOPHAGE-XR) 500 MG 24 hr tablet, , Disp: , Rfl:   Social History   Tobacco Use  Smoking Status Never Smoker  Smokeless Tobacco Never Used    No Known Allergies Objective:   Vitals:   05/07/19 0933  BP: 140/85  Pulse: 87  Resp: 16  Temp: 98.7 F (37.1 C)  BP 200/94 Pulse 84  There is no height or weight on file to calculate BMI. Constitutional Well developed. Well nourished.  Vascular Dorsalis pedis pulses palpable bilaterally. Posterior tibial pulses palpable bilaterally. Capillary refill normal to all digits.  No cyanosis or clubbing noted. Pedal hair growth normal.  Neurologic Normal speech. Oriented to person, place, and time. Protective sensation absent  Dermatologic Incision at L 1st interspace coapted with intact suture and staple. Open areas dorsal and plantar with serous  drainage no purulence no erythema no excessive warmth no signs of acute infeciton.  Orthopedic: No pain to palpation either foot.   Radiographs: Taken and reviewed no osseous erosions noted no subcutaneous emphysema. No acute fractures or dislocations. Assessment:   1. Cellulitis and abscess of toe of left foot   2. Post-operative state    Plan:  Patient was evaluated and treated and all questions answered.  S/p L 1st Interspace I&D -Healing well. No signs of acute infection -Continue Abx. Will plan to refill at next appt. -Continue packing with [facility]. Iodosorb. Patient's wife was able to demonstrate competency doing so as they were told [place] could only come once weekly but will see if they can come twice weekly. Pack both dorsal and plantar with 1/4 in iodorform packing daily. Patient's wife to do all other days.  No follow-ups on file.

## 2019-05-09 DIAGNOSIS — L03116 Cellulitis of left lower limb: Secondary | ICD-10-CM | POA: Diagnosis not present

## 2019-05-09 DIAGNOSIS — M868X7 Other osteomyelitis, ankle and foot: Secondary | ICD-10-CM | POA: Diagnosis not present

## 2019-05-09 DIAGNOSIS — E11621 Type 2 diabetes mellitus with foot ulcer: Secondary | ICD-10-CM | POA: Diagnosis not present

## 2019-05-09 DIAGNOSIS — Z125 Encounter for screening for malignant neoplasm of prostate: Secondary | ICD-10-CM | POA: Diagnosis not present

## 2019-05-09 DIAGNOSIS — I10 Essential (primary) hypertension: Secondary | ICD-10-CM | POA: Diagnosis not present

## 2019-05-09 DIAGNOSIS — E785 Hyperlipidemia, unspecified: Secondary | ICD-10-CM | POA: Diagnosis not present

## 2019-05-09 DIAGNOSIS — L97529 Non-pressure chronic ulcer of other part of left foot with unspecified severity: Secondary | ICD-10-CM | POA: Diagnosis not present

## 2019-05-09 DIAGNOSIS — E1140 Type 2 diabetes mellitus with diabetic neuropathy, unspecified: Secondary | ICD-10-CM | POA: Diagnosis not present

## 2019-05-09 DIAGNOSIS — L02612 Cutaneous abscess of left foot: Secondary | ICD-10-CM | POA: Diagnosis not present

## 2019-05-10 DIAGNOSIS — E785 Hyperlipidemia, unspecified: Secondary | ICD-10-CM | POA: Diagnosis not present

## 2019-05-10 DIAGNOSIS — L03116 Cellulitis of left lower limb: Secondary | ICD-10-CM | POA: Diagnosis not present

## 2019-05-10 DIAGNOSIS — E1140 Type 2 diabetes mellitus with diabetic neuropathy, unspecified: Secondary | ICD-10-CM | POA: Diagnosis not present

## 2019-05-10 DIAGNOSIS — D519 Vitamin B12 deficiency anemia, unspecified: Secondary | ICD-10-CM | POA: Diagnosis not present

## 2019-05-10 DIAGNOSIS — Z9181 History of falling: Secondary | ICD-10-CM | POA: Diagnosis not present

## 2019-05-10 DIAGNOSIS — Z1331 Encounter for screening for depression: Secondary | ICD-10-CM | POA: Diagnosis not present

## 2019-05-10 DIAGNOSIS — D509 Iron deficiency anemia, unspecified: Secondary | ICD-10-CM | POA: Diagnosis not present

## 2019-05-10 DIAGNOSIS — E1165 Type 2 diabetes mellitus with hyperglycemia: Secondary | ICD-10-CM | POA: Diagnosis not present

## 2019-05-13 ENCOUNTER — Other Ambulatory Visit: Payer: Self-pay

## 2019-05-13 ENCOUNTER — Ambulatory Visit (INDEPENDENT_AMBULATORY_CARE_PROVIDER_SITE_OTHER): Payer: Medicare Other

## 2019-05-13 ENCOUNTER — Other Ambulatory Visit: Payer: Self-pay | Admitting: Podiatry

## 2019-05-13 ENCOUNTER — Encounter: Payer: Self-pay | Admitting: Podiatry

## 2019-05-13 ENCOUNTER — Ambulatory Visit (INDEPENDENT_AMBULATORY_CARE_PROVIDER_SITE_OTHER): Payer: Medicare Other | Admitting: Podiatry

## 2019-05-13 VITALS — Temp 98.7°F | Resp 16

## 2019-05-13 DIAGNOSIS — L03119 Cellulitis of unspecified part of limb: Secondary | ICD-10-CM

## 2019-05-13 DIAGNOSIS — L02612 Cutaneous abscess of left foot: Secondary | ICD-10-CM

## 2019-05-13 DIAGNOSIS — D519 Vitamin B12 deficiency anemia, unspecified: Secondary | ICD-10-CM | POA: Diagnosis not present

## 2019-05-13 DIAGNOSIS — Z9889 Other specified postprocedural states: Secondary | ICD-10-CM

## 2019-05-13 DIAGNOSIS — L03032 Cellulitis of left toe: Secondary | ICD-10-CM | POA: Diagnosis not present

## 2019-05-13 MED ORDER — AMOXICILLIN-POT CLAVULANATE 875-125 MG PO TABS: 1.0000 | ORAL_TABLET | Freq: Two times a day (BID) | ORAL | 0 refills | Status: DC

## 2019-05-13 MED ORDER — LEVOFLOXACIN 750 MG PO TABS: 750.0000 mg | ORAL_TABLET | Freq: Every day | ORAL | 0 refills | Status: DC

## 2019-05-13 NOTE — Progress Notes (Signed)
Quiet  Subjective:  Patient ID: James Wohl., male    DOB: March 04, 1941,  MRN: [id number]  Chief Complaint  Patient presents with  . Wound Check    F/U Lt wound check Pt. states," it's been doing fine, they said it looked good." -pt states he really doesn't have any pain -pt states draiange and redness are better -pt denies swelling Tx: abx, packing and dressing change  . Diabetes    FBS: 68 A1C: 8    78 y.o. male presents for post-op follow up. History as above. Doing well. Nursing and wife packing wound every other day.  Review of Systems: Negative except as noted in the HPI. Denies N/V/F/Ch.  No past medical history on file.  Current Outpatient Medications:  .  amLODipine (NORVASC) 5 MG tablet, , Disp: , Rfl:  .  amoxicillin-clavulanate (AUGMENTIN) 875-125 MG tablet, Take 1 tablet by mouth 2 (two) times daily., Disp: 14 tablet, Rfl: 0 .  atorvastatin (LIPITOR) 80 MG tablet, , Disp: , Rfl:  .  LEVEMIR FLEXTOUCH 100 UNIT/ML Pen, , Disp: , Rfl:  .  levofloxacin (LEVAQUIN) 750 MG tablet, Take 1 tablet (750 mg total) by mouth daily., Disp: 7 tablet, Rfl: 0 .  lisinopril (ZESTRIL) 10 MG tablet, , Disp: , Rfl:  .  lisinopril (ZESTRIL) 20 MG tablet, , Disp: , Rfl:  .  metFORMIN (GLUCOPHAGE-XR) 500 MG 24 hr tablet, , Disp: , Rfl:   Social History   Tobacco Use  Smoking Status Never Smoker  Smokeless Tobacco Never Used    No Known Allergies Objective:   Vitals:   05/13/19 0945  Resp: 16  Temp: 98.7 F (37.1 C)  BP 200/94 Pulse 84  There is no height or weight on file to calculate BMI. Constitutional Well developed. Well nourished.  Vascular Dorsalis pedis pulses palpable bilaterally. Posterior tibial pulses palpable bilaterally. Capillary refill normal to all digits.  No cyanosis or clubbing noted. Pedal hair growth normal.  Neurologic Normal speech. Oriented to person, place, and time. Protective sensation absent  Dermatologic Incision at L 1st interspace  healing well. Slight residual erythema but without excessive warmth. No purulence. Serous drainage only   Orthopedic: No pain to palpation either foot.   Radiographs: Taken and reviewed possible erosion of medial proximal phalanx base. Assessment:   1. Cellulitis and abscess of toe of left foot   2. Post-operative state    Plan:  Patient was evaluated and treated and all questions answered.  S/p L 1st Interspace I&D -Healing well. No signs of acute infection -Continue Abx. Refilled for an additional week -Advised to pack wound daily -New culture today. -Continued slight erythema. XR taken today to eval for possible underlying osteo. There does appear to be a possible slight irregularity of hte medial aspect of the 2nd proximal phalanx. Will monitor. Check new XR nxt visit. -Will check on blood work patient had done. Will look into getting CBC, CRP, ESR if not done already  Return in about 1 week (around 05/20/2019) for Post-op, Wound Care, XRs. Rpeat XR next visit.

## 2019-05-13 NOTE — Addendum Note (Signed)
Addended by: Hardie Pulley on: 05/13/2019 05:27 PM   Modules accepted: Orders

## 2019-05-14 ENCOUNTER — Other Ambulatory Visit: Payer: Self-pay | Admitting: Podiatry

## 2019-05-14 DIAGNOSIS — L02612 Cutaneous abscess of left foot: Secondary | ICD-10-CM

## 2019-05-14 DIAGNOSIS — L03032 Cellulitis of left toe: Secondary | ICD-10-CM

## 2019-05-15 DIAGNOSIS — L02612 Cutaneous abscess of left foot: Secondary | ICD-10-CM | POA: Diagnosis not present

## 2019-05-15 DIAGNOSIS — L97529 Non-pressure chronic ulcer of other part of left foot with unspecified severity: Secondary | ICD-10-CM | POA: Diagnosis not present

## 2019-05-15 DIAGNOSIS — L03116 Cellulitis of left lower limb: Secondary | ICD-10-CM | POA: Diagnosis not present

## 2019-05-15 DIAGNOSIS — M868X7 Other osteomyelitis, ankle and foot: Secondary | ICD-10-CM | POA: Diagnosis not present

## 2019-05-15 DIAGNOSIS — I10 Essential (primary) hypertension: Secondary | ICD-10-CM | POA: Diagnosis not present

## 2019-05-15 DIAGNOSIS — E11621 Type 2 diabetes mellitus with foot ulcer: Secondary | ICD-10-CM | POA: Diagnosis not present

## 2019-05-16 LAB — WOUND CULTURE: Organism ID, Bacteria: NONE SEEN

## 2019-05-20 ENCOUNTER — Other Ambulatory Visit: Payer: Self-pay

## 2019-05-20 ENCOUNTER — Encounter: Payer: Self-pay | Admitting: Podiatry

## 2019-05-20 ENCOUNTER — Ambulatory Visit (INDEPENDENT_AMBULATORY_CARE_PROVIDER_SITE_OTHER): Payer: Medicare Other | Admitting: Podiatry

## 2019-05-20 ENCOUNTER — Telehealth: Payer: Self-pay | Admitting: *Deleted

## 2019-05-20 ENCOUNTER — Other Ambulatory Visit: Payer: Self-pay | Admitting: Podiatry

## 2019-05-20 ENCOUNTER — Ambulatory Visit (INDEPENDENT_AMBULATORY_CARE_PROVIDER_SITE_OTHER): Payer: Medicare Other

## 2019-05-20 VITALS — Temp 99.9°F | Resp 16

## 2019-05-20 DIAGNOSIS — L02612 Cutaneous abscess of left foot: Secondary | ICD-10-CM

## 2019-05-20 DIAGNOSIS — D519 Vitamin B12 deficiency anemia, unspecified: Secondary | ICD-10-CM | POA: Diagnosis not present

## 2019-05-20 DIAGNOSIS — Z9889 Other specified postprocedural states: Secondary | ICD-10-CM

## 2019-05-20 DIAGNOSIS — L03032 Cellulitis of left toe: Secondary | ICD-10-CM

## 2019-05-20 MED ORDER — AMOXICILLIN-POT CLAVULANATE 875-125 MG PO TABS: 1.0000 | ORAL_TABLET | Freq: Two times a day (BID) | ORAL | 0 refills | Status: DC

## 2019-05-20 MED ORDER — LEVOFLOXACIN 750 MG PO TABS: 750.0000 mg | ORAL_TABLET | Freq: Every day | ORAL | 0 refills | Status: DC

## 2019-05-20 NOTE — Progress Notes (Signed)
Quiet  Subjective:  Patient ID: James Walthall., male    DOB: [birth year],  MRN: [id number]  Chief Complaint  Patient presents with  . Wound Check    F/U Lt wound check Pt. states," it's been doing alright, I can tell it looks better, no pain." Tx: abx, boot, packing/dressing -w/ less drainage and redness -pt denies swelling    78 y.o. male presents for post-op follow up. History as above. Doing well. Hx as above. Thinks the wound is doing better.  Review of Systems: Negative except as noted in the HPI. Denies N/V/F/Ch.  No past medical history on file.  Current Outpatient Medications:  .  amLODipine (NORVASC) 5 MG tablet, , Disp: , Rfl:  .  amoxicillin-clavulanate (AUGMENTIN) 875-125 MG tablet, Take 1 tablet by mouth 2 (two) times daily., Disp: 14 tablet, Rfl: 0 .  atorvastatin (LIPITOR) 80 MG tablet, , Disp: , Rfl:  .  LEVEMIR FLEXTOUCH 100 UNIT/ML Pen, , Disp: , Rfl:  .  levofloxacin (LEVAQUIN) 750 MG tablet, Take 1 tablet (750 mg total) by mouth daily., Disp: 7 tablet, Rfl: 0 .  lisinopril (ZESTRIL) 10 MG tablet, , Disp: , Rfl:  .  lisinopril (ZESTRIL) 20 MG tablet, , Disp: , Rfl:  .  metFORMIN (GLUCOPHAGE-XR) 500 MG 24 hr tablet, , Disp: , Rfl:   Social History   Tobacco Use  Smoking Status Never Smoker  Smokeless Tobacco Never Used    No Known Allergies Objective:   Vitals:   05/20/19 0926  Resp: 16  Temp: 99.9 F (37.7 C)  BP 200/94 Pulse 84  There is no height or weight on file to calculate BMI. Constitutional Well developed. Well nourished.  Vascular Dorsalis pedis pulses palpable bilaterally. Posterior tibial pulses palpable bilaterally. Capillary refill normal to all digits.  No cyanosis or clubbing noted. Pedal hair growth normal.  Neurologic Normal speech. Oriented to person, place, and time. Protective sensation absent  Dermatologic Incision at L 1st interspace healing well. No active erythema. Serous drainage. Dorsal wound probes about 0.5 cm.  Plantar probes 1.5 cm.   Orthopedic: No pain to palpation either foot.   Radiographs: Taken and reviewed no interval changes compared to prior. No definite erosions. Assessment:   1. Post-operative state    Plan:  Patient was evaluated and treated and all questions answered.  S/p L 1st Interspace I&D -Healing well. Wound appears amenable to closure dorsally. Will plan for operative debridement and partial delayed closure. To be performed tomorrow if able.  -Continue Abx. Refilled for one final week. Will obtain new intra-op cultures at time of surgery. -Culture reviewed, no growth.  No follow-ups on file.

## 2019-05-20 NOTE — Patient Instructions (Signed)
Pre-Operative Instructions  Congratulations, you have decided to take an important step towards improving your quality of life.  You can be assured that the doctors and staff at [facility] will be with you every step of the way.  Here are some important things you should know:  1. Plan to be at the surgery center/hospital at least 1 (one) hour prior to your scheduled time, unless otherwise directed by the surgical center/hospital staff.  You must have a responsible adult accompany you, remain during the surgery and drive you home.  Make sure you have directions to the surgical center/hospital to ensure you arrive on time. 2. If you are having surgery at [facility] or [facility] hospitals, you will need a copy of your medical history and physical form from your family physician within one month prior to the date of surgery. We will give you a form for your primary physician to complete.  3. We make every effort to accommodate the date you request for surgery.  However, there are times where surgery dates or times have to be moved.  We will contact you as soon as possible if a change in schedule is required.   4. No aspirin/ibuprofen for one week before surgery.  If you are on aspirin, any non-steroidal anti-inflammatory medications (Mobic, Aleve, Ibuprofen) should not be taken seven (7) days prior to your surgery.  You make take Tylenol for pain prior to surgery.  5. Medications - If you are taking daily heart and blood pressure medications, seizure, reflux, allergy, asthma, anxiety, pain or diabetes medications, make sure you notify the surgery center/hospital before the day of surgery so they can tell you which medications you should take or avoid the day of surgery. 6. No food or drink after midnight the night before surgery unless directed otherwise by surgical center/hospital staff. 7. No alcoholic beverages 24-hours prior to surgery.  No smoking 24-hours prior or 24-hours after  surgery. 8. Wear loose pants or shorts. They should be loose enough to fit over bandages, boots, and casts. 9. Don't wear slip-on shoes. Sneakers are preferred. 10. Bring your boot with you to the surgery center/hospital.  Also bring crutches or a walker if your physician has prescribed it for you.  If you do not have this equipment, it will be provided for you after surgery. 11. If you have not been contacted by the surgery center/hospital by the day before your surgery, call to confirm the date and time of your surgery. 12. Leave-time from work may vary depending on the type of surgery you have.  Appropriate arrangements should be made prior to surgery with your employer. 13. Prescriptions will be provided immediately following surgery by your doctor.  Fill these as soon as possible after surgery and take the medication as directed. Pain medications will not be refilled on weekends and must be approved by the doctor. 14. Remove nail polish on the operative foot and avoid getting pedicures prior to surgery. 15. Wash the night before surgery.  The night before surgery wash the foot and leg well with water and the antibacterial soap provided. Be sure to pay special attention to beneath the toenails and in between the toes.  Wash for at least three (3) minutes. Rinse thoroughly with water and dry well with a towel.  Perform this wash unless told not to do so by your physician.  Enclosed: 1 Ice pack (please put in freezer the night before surgery)   1 Hibiclens skin cleaner     Pre-op instructions  If you have any questions regarding the instructions, please do not hesitate to call our office.  [place]: [address], [place], [place] [address] -- [phone]  [place]: [address] [place], [place] [address] -- [phone]  [place]: [address]  [place], [place] [address] -- [phone]  [place]: [address], [address], [place], [place] [address] -- [phone]  Website:  [email or web address] 

## 2019-05-20 NOTE — Telephone Encounter (Signed)
"  I'm supposed to call you about having surgery tomorrow at [facility].  Call me back on this number."  I am returning your call.  How can I help you?  "I was told to call you about setting up my surgery but everything has already been taken care of."  Yes sir, I got everything scheduled.  "Thank you so much."

## 2019-05-21 ENCOUNTER — Other Ambulatory Visit: Payer: Self-pay | Admitting: Podiatry

## 2019-05-21 DIAGNOSIS — E119 Type 2 diabetes mellitus without complications: Secondary | ICD-10-CM | POA: Diagnosis not present

## 2019-05-21 DIAGNOSIS — L02612 Cutaneous abscess of left foot: Secondary | ICD-10-CM

## 2019-05-21 DIAGNOSIS — E785 Hyperlipidemia, unspecified: Secondary | ICD-10-CM | POA: Diagnosis not present

## 2019-05-21 DIAGNOSIS — E11621 Type 2 diabetes mellitus with foot ulcer: Secondary | ICD-10-CM | POA: Diagnosis not present

## 2019-05-21 DIAGNOSIS — Z79899 Other long term (current) drug therapy: Secondary | ICD-10-CM | POA: Diagnosis not present

## 2019-05-21 DIAGNOSIS — L03032 Cellulitis of left toe: Secondary | ICD-10-CM

## 2019-05-21 DIAGNOSIS — Z481 Encounter for planned postprocedural wound closure: Secondary | ICD-10-CM | POA: Diagnosis not present

## 2019-05-21 DIAGNOSIS — Z7984 Long term (current) use of oral hypoglycemic drugs: Secondary | ICD-10-CM | POA: Diagnosis not present

## 2019-05-21 DIAGNOSIS — Z792 Long term (current) use of antibiotics: Secondary | ICD-10-CM | POA: Diagnosis not present

## 2019-05-21 DIAGNOSIS — L97519 Non-pressure chronic ulcer of other part of right foot with unspecified severity: Secondary | ICD-10-CM | POA: Diagnosis not present

## 2019-05-21 DIAGNOSIS — I10 Essential (primary) hypertension: Secondary | ICD-10-CM | POA: Diagnosis not present

## 2019-05-21 DIAGNOSIS — Z9889 Other specified postprocedural states: Secondary | ICD-10-CM

## 2019-05-22 DIAGNOSIS — I10 Essential (primary) hypertension: Secondary | ICD-10-CM | POA: Diagnosis not present

## 2019-05-22 DIAGNOSIS — E11621 Type 2 diabetes mellitus with foot ulcer: Secondary | ICD-10-CM | POA: Diagnosis not present

## 2019-05-22 DIAGNOSIS — L03116 Cellulitis of left lower limb: Secondary | ICD-10-CM | POA: Diagnosis not present

## 2019-05-22 DIAGNOSIS — M868X7 Other osteomyelitis, ankle and foot: Secondary | ICD-10-CM | POA: Diagnosis not present

## 2019-05-22 DIAGNOSIS — L02612 Cutaneous abscess of left foot: Secondary | ICD-10-CM | POA: Diagnosis not present

## 2019-05-22 DIAGNOSIS — L97529 Non-pressure chronic ulcer of other part of left foot with unspecified severity: Secondary | ICD-10-CM | POA: Diagnosis not present

## 2019-05-27 ENCOUNTER — Other Ambulatory Visit: Payer: Self-pay | Admitting: Podiatry

## 2019-05-27 ENCOUNTER — Encounter: Payer: Self-pay | Admitting: Podiatry

## 2019-05-27 ENCOUNTER — Ambulatory Visit (INDEPENDENT_AMBULATORY_CARE_PROVIDER_SITE_OTHER): Payer: Medicare Other | Admitting: Podiatry

## 2019-05-27 ENCOUNTER — Other Ambulatory Visit: Payer: Self-pay

## 2019-05-27 VITALS — Temp 98.4°F | Resp 16

## 2019-05-27 DIAGNOSIS — D519 Vitamin B12 deficiency anemia, unspecified: Secondary | ICD-10-CM | POA: Diagnosis not present

## 2019-05-27 DIAGNOSIS — L02612 Cutaneous abscess of left foot: Secondary | ICD-10-CM

## 2019-05-27 DIAGNOSIS — Z9889 Other specified postprocedural states: Secondary | ICD-10-CM

## 2019-05-27 DIAGNOSIS — L03032 Cellulitis of left toe: Secondary | ICD-10-CM

## 2019-05-27 MED ORDER — AMOXICILLIN-POT CLAVULANATE 875-125 MG PO TABS: 1.0000 | ORAL_TABLET | Freq: Two times a day (BID) | ORAL | 0 refills | Status: DC

## 2019-05-27 NOTE — Progress Notes (Signed)
Quiet  Subjective:  Patient ID: James Norton., male    DOB: 1941/10/27,  MRN: [id number]  Chief Complaint  Patient presents with  . Routine Post Op    POV 1 wound closure Pt. states," it's doingalright, wife and nurse says it's looking better each time." -pt denies pain/V/F/?Ch Tx: boot and abx -w/ little bit of bloody draiange/and less draiange     78 y.o. male presents for post-op follow up. Underwent repeat debridement and wound closure last week. Doing well thinks the wound is doing better.  Review of Systems: Negative except as noted in the HPI. Denies N/V/F/Ch.  No past medical history on file.  Current Outpatient Medications:  .  amLODipine (NORVASC) 5 MG tablet, , Disp: , Rfl:  .  amoxicillin-clavulanate (AUGMENTIN) 875-125 MG tablet, Take 1 tablet by mouth 2 (two) times daily., Disp: 14 tablet, Rfl: 0 .  atorvastatin (LIPITOR) 80 MG tablet, , Disp: , Rfl:  .  LEVEMIR FLEXTOUCH 100 UNIT/ML Pen, , Disp: , Rfl:  .  lisinopril (ZESTRIL) 10 MG tablet, , Disp: , Rfl:  .  lisinopril (ZESTRIL) 20 MG tablet, , Disp: , Rfl:  .  metFORMIN (GLUCOPHAGE-XR) 500 MG 24 hr tablet, , Disp: , Rfl:   Social History   Tobacco Use  Smoking Status Never Smoker  Smokeless Tobacco Never Used    No Known Allergies Objective:   Vitals:   05/27/19 1023  Resp: 16  Temp: 98.4 F (36.9 C)  BP 200/94 Pulse 84  There is no height or weight on file to calculate BMI. Constitutional Well developed. Well nourished.  Vascular Dorsalis pedis pulses palpable bilaterally. Posterior tibial pulses palpable bilaterally. Capillary refill normal to all digits.  No cyanosis or clubbing noted. Pedal hair growth normal.  Neurologic Normal speech. Oriented to person, place, and time. Protective sensation absent  Dermatologic Incision at L 1st interspace healing well with intact suture and staple material, open area dorsally and plantarly. Serous drainage no warmth erythema signs of acute infection.   Orthopedic: No pain to palpation either foot.   Radiographs: None Assessment:   1. Post-operative state   2. Cellulitis and abscess of toe of left foot    Plan:  Patient was evaluated and treated and all questions answered.  S/p L 1st Interspace debridement and closure -Operative culture reviewe.d Few colonies of mixed skin fluora, will continue abx for additional week. -Wound repacked. Continue packing with [facility]. -F/u 1 week for recheck.  No follow-ups on file.

## 2019-05-30 DIAGNOSIS — I10 Essential (primary) hypertension: Secondary | ICD-10-CM | POA: Diagnosis not present

## 2019-05-30 DIAGNOSIS — L97529 Non-pressure chronic ulcer of other part of left foot with unspecified severity: Secondary | ICD-10-CM | POA: Diagnosis not present

## 2019-05-30 DIAGNOSIS — M868X7 Other osteomyelitis, ankle and foot: Secondary | ICD-10-CM | POA: Diagnosis not present

## 2019-05-30 DIAGNOSIS — L03116 Cellulitis of left lower limb: Secondary | ICD-10-CM | POA: Diagnosis not present

## 2019-05-30 DIAGNOSIS — E11621 Type 2 diabetes mellitus with foot ulcer: Secondary | ICD-10-CM | POA: Diagnosis not present

## 2019-05-30 DIAGNOSIS — L02612 Cutaneous abscess of left foot: Secondary | ICD-10-CM | POA: Diagnosis not present

## 2019-06-03 ENCOUNTER — Encounter: Payer: Medicare Other | Admitting: Podiatry

## 2019-06-03 DIAGNOSIS — D519 Vitamin B12 deficiency anemia, unspecified: Secondary | ICD-10-CM | POA: Diagnosis not present

## 2019-06-05 ENCOUNTER — Encounter: Payer: Self-pay | Admitting: Sports Medicine

## 2019-06-05 ENCOUNTER — Other Ambulatory Visit: Payer: Self-pay

## 2019-06-05 ENCOUNTER — Ambulatory Visit (INDEPENDENT_AMBULATORY_CARE_PROVIDER_SITE_OTHER): Payer: Self-pay | Admitting: Sports Medicine

## 2019-06-05 VITALS — Temp 98.4°F | Resp 16

## 2019-06-05 DIAGNOSIS — Z7984 Long term (current) use of oral hypoglycemic drugs: Secondary | ICD-10-CM | POA: Diagnosis not present

## 2019-06-05 DIAGNOSIS — E11628 Type 2 diabetes mellitus with other skin complications: Secondary | ICD-10-CM

## 2019-06-05 DIAGNOSIS — I10 Essential (primary) hypertension: Secondary | ICD-10-CM | POA: Diagnosis not present

## 2019-06-05 DIAGNOSIS — Z7982 Long term (current) use of aspirin: Secondary | ICD-10-CM | POA: Diagnosis not present

## 2019-06-05 DIAGNOSIS — Z79899 Other long term (current) drug therapy: Secondary | ICD-10-CM | POA: Diagnosis not present

## 2019-06-05 DIAGNOSIS — E782 Mixed hyperlipidemia: Secondary | ICD-10-CM | POA: Diagnosis not present

## 2019-06-05 DIAGNOSIS — L03032 Cellulitis of left toe: Secondary | ICD-10-CM

## 2019-06-05 DIAGNOSIS — Z794 Long term (current) use of insulin: Secondary | ICD-10-CM | POA: Diagnosis not present

## 2019-06-05 DIAGNOSIS — B9561 Methicillin susceptible Staphylococcus aureus infection as the cause of diseases classified elsewhere: Secondary | ICD-10-CM | POA: Diagnosis not present

## 2019-06-05 DIAGNOSIS — L03116 Cellulitis of left lower limb: Secondary | ICD-10-CM | POA: Diagnosis not present

## 2019-06-05 DIAGNOSIS — Z792 Long term (current) use of antibiotics: Secondary | ICD-10-CM | POA: Diagnosis not present

## 2019-06-05 DIAGNOSIS — L02612 Cutaneous abscess of left foot: Secondary | ICD-10-CM

## 2019-06-05 DIAGNOSIS — M868X7 Other osteomyelitis, ankle and foot: Secondary | ICD-10-CM | POA: Diagnosis not present

## 2019-06-05 DIAGNOSIS — E11621 Type 2 diabetes mellitus with foot ulcer: Secondary | ICD-10-CM | POA: Diagnosis not present

## 2019-06-05 DIAGNOSIS — Z9889 Other specified postprocedural states: Secondary | ICD-10-CM

## 2019-06-05 DIAGNOSIS — L97529 Non-pressure chronic ulcer of other part of left foot with unspecified severity: Secondary | ICD-10-CM | POA: Diagnosis not present

## 2019-06-05 DIAGNOSIS — L089 Local infection of the skin and subcutaneous tissue, unspecified: Secondary | ICD-10-CM

## 2019-06-05 NOTE — Progress Notes (Signed)
Subjective: James Norton. is a 78 y.o. male patient seen today in office for POV #3 (DOS 05/21/2019), S/P I&D with debridement and closure performed by Dr. March Rummage.  Patient denies pain at surgical site, admits that the home nurse is coming to help with packing the area and the areas are healing very well.  Denies calf pain, denies headache, chest pain, shortness of breath, nausea, vomiting, fever, or chills.  Patient reports that he will be finished with his antibiotic.  No other issues noted.   There are no active problems to display for this patient.   Current Outpatient Medications on File Prior to Visit  Medication Sig Dispense Refill  . amLODipine (NORVASC) 5 MG tablet     . amoxicillin-clavulanate (AUGMENTIN) 875-125 MG tablet Take 1 tablet by mouth 2 (two) times daily. 14 tablet 0  . atorvastatin (LIPITOR) 80 MG tablet     . LEVEMIR FLEXTOUCH 100 UNIT/ML Pen     . lisinopril (ZESTRIL) 10 MG tablet     . lisinopril (ZESTRIL) 20 MG tablet     . metFORMIN (GLUCOPHAGE-XR) 500 MG 24 hr tablet      No current facility-administered medications on file prior to visit.     No Known Allergies  Objective: There were no vitals filed for this visit.  General: No acute distress, AAOx3  Left foot: Staples intact with no gapping or dehiscence at surgical site, at the dorsal proximal and plantar proximal aspect of the I&D site the wound is healing by secondary intention with the wound base is granular all open wounds at the ends of the incision site are less than 0.5 cm and tunnels less than 1 cm deep with a granular base appears to be improving and slowly filling in at the first interspace with no redness warmth drainage, pedal pulses are palpable no pain to palpation to left foot or calf.  Assessment and Plan:  Problem List Items Addressed This Visit    None    Visit Diagnoses    Post-operative state    -  Primary   Cellulitis and abscess of toe of left foot       Type 2 diabetes  mellitus with diabetic foot infection ([place])           -Patient seen and evaluated -Applied packing and dry sterile dressing to surgical site left foot secured with ACE wrap and stockinet  -Advised patient to to keep dressing clean dry and intact and allow nursing to assist with dressing changes weekly -Continue with oral antibiotic until completed -Advised patient to continue with limiting activity and cam boot -Will plan for wound check with Dr. March Rummage at next office visit. In the meantime, patient to call office if any issues or problems arise.   Landis Martins, DPM

## 2019-06-06 DIAGNOSIS — I10 Essential (primary) hypertension: Secondary | ICD-10-CM | POA: Diagnosis not present

## 2019-06-06 DIAGNOSIS — L03116 Cellulitis of left lower limb: Secondary | ICD-10-CM | POA: Diagnosis not present

## 2019-06-06 DIAGNOSIS — M868X7 Other osteomyelitis, ankle and foot: Secondary | ICD-10-CM | POA: Diagnosis not present

## 2019-06-06 DIAGNOSIS — L02612 Cutaneous abscess of left foot: Secondary | ICD-10-CM | POA: Diagnosis not present

## 2019-06-06 DIAGNOSIS — E11621 Type 2 diabetes mellitus with foot ulcer: Secondary | ICD-10-CM | POA: Diagnosis not present

## 2019-06-06 DIAGNOSIS — L97529 Non-pressure chronic ulcer of other part of left foot with unspecified severity: Secondary | ICD-10-CM | POA: Diagnosis not present

## 2019-06-10 ENCOUNTER — Encounter: Payer: Self-pay | Admitting: Podiatry

## 2019-06-10 ENCOUNTER — Ambulatory Visit (INDEPENDENT_AMBULATORY_CARE_PROVIDER_SITE_OTHER): Payer: Medicare Other | Admitting: Podiatry

## 2019-06-10 ENCOUNTER — Other Ambulatory Visit: Payer: Self-pay

## 2019-06-10 VITALS — Temp 98.4°F | Resp 16

## 2019-06-10 DIAGNOSIS — Z9889 Other specified postprocedural states: Secondary | ICD-10-CM

## 2019-06-10 NOTE — Progress Notes (Signed)
Quiet  Subjective:  Patient ID: James Norton., male    DOB: September 05, 1941,  MRN: [id number]  Chief Complaint  Patient presents with  . Routine Post Op    DOS 05/21/2019 WOUND DEBRIDEMENT W/ WOUND CLOSURE -Pt. states," it's getting better, hole is smaller.": -pt denies pain/swelling -w/ less drainage and redness Tx: packing/dressing change and cam boot - pt completed abx  . Diabetes    FBS: 98 A1C: 7.9    78 y.o. male presents for post-op follow up. Hx as above. Doing well thinks the wounds are getting smaller.  Review of Systems: Negative except as noted in the HPI. Denies N/V/F/Ch.  No past medical history on file.  Current Outpatient Medications:  .  amLODipine (NORVASC) 5 MG tablet, , Disp: , Rfl:  .  amoxicillin-clavulanate (AUGMENTIN) 875-125 MG tablet, Take 1 tablet by mouth 2 (two) times daily., Disp: 14 tablet, Rfl: 0 .  atorvastatin (LIPITOR) 80 MG tablet, , Disp: , Rfl:  .  LEVEMIR FLEXTOUCH 100 UNIT/ML Pen, , Disp: , Rfl:  .  lisinopril (ZESTRIL) 10 MG tablet, , Disp: , Rfl:  .  lisinopril (ZESTRIL) 20 MG tablet, , Disp: , Rfl:  .  metFORMIN (GLUCOPHAGE-XR) 500 MG 24 hr tablet, , Disp: , Rfl:   Social History   Tobacco Use  Smoking Status Never Smoker  Smokeless Tobacco Never Used    No Known Allergies Objective:   Vitals:   06/10/19 0830  Resp: 16  Temp: 98.4 F (36.9 C)  BP 200/94 Pulse 84  There is no height or weight on file to calculate BMI. Constitutional Well developed. Well nourished.  Vascular Dorsalis pedis pulses palpable bilaterally. Posterior tibial pulses palpable bilaterally. Capillary refill normal to all digits.  No cyanosis or clubbing noted. Pedal hair growth normal.  Neurologic Normal speech. Oriented to person, place, and time. Protective sensation absent  Dermatologic Incision at L 1st interspace healing well with minimal depth dorsally but tunneling 6 o clock at the plantar wound. No warmth erythema signs of acute infection   Orthopedic: No pain to palpation either foot.   Radiographs: None Assessment:   1. Post-operative state    Plan:  Patient was evaluated and treated and all questions answered.  S/p L 1st Interspace debridement and closure -Wound thoroughly cleansed and flushed. Packed today. Plantar wound debrided of excess hyperkeratotic rim. Covered under global. -Continue wound packing daily.  -No further abx required.  Return in about 2 weeks (around 06/24/2019) for Wound Care, Right.

## 2019-06-12 ENCOUNTER — Ambulatory Visit (INDEPENDENT_AMBULATORY_CARE_PROVIDER_SITE_OTHER): Payer: Medicare Other | Admitting: Sports Medicine

## 2019-06-12 ENCOUNTER — Encounter: Payer: Self-pay | Admitting: Sports Medicine

## 2019-06-12 ENCOUNTER — Other Ambulatory Visit: Payer: Self-pay

## 2019-06-12 VITALS — Temp 99.1°F | Resp 16

## 2019-06-12 DIAGNOSIS — L089 Local infection of the skin and subcutaneous tissue, unspecified: Secondary | ICD-10-CM

## 2019-06-12 DIAGNOSIS — L02612 Cutaneous abscess of left foot: Secondary | ICD-10-CM

## 2019-06-12 DIAGNOSIS — Z9889 Other specified postprocedural states: Secondary | ICD-10-CM

## 2019-06-12 DIAGNOSIS — L97522 Non-pressure chronic ulcer of other part of left foot with fat layer exposed: Secondary | ICD-10-CM

## 2019-06-12 DIAGNOSIS — L03032 Cellulitis of left toe: Secondary | ICD-10-CM

## 2019-06-12 DIAGNOSIS — E11628 Type 2 diabetes mellitus with other skin complications: Secondary | ICD-10-CM

## 2019-06-12 MED ORDER — AMOXICILLIN-POT CLAVULANATE 875-125 MG PO TABS: 1.0000 | ORAL_TABLET | Freq: Two times a day (BID) | ORAL | 0 refills | Status: DC

## 2019-06-12 NOTE — Progress Notes (Signed)
Subjective: James Norton. is a 78 y.o. male patient seen today in office for POV #5 (DOS 05/21/2019), S/P I&D with debridement and closure performed by Dr. March Rummage.  Patient denies pain at surgical site, admits that the home nurse is coming to help with packing the area and the areas are healing very well but on today when he took the dressing off noticed that his foot was red and decided to come in to have it checked.  Denies calf pain, denies headache, chest pain, shortness of breath, nausea, vomiting, fever, or chills.  Patient reports that he will be finished with his antibiotic and is unsure if he needs more.  No other issues noted.   There are no active problems to display for this patient.   Current Outpatient Medications on File Prior to Visit  Medication Sig Dispense Refill  . amLODipine (NORVASC) 5 MG tablet     . atorvastatin (LIPITOR) 80 MG tablet     . LEVEMIR FLEXTOUCH 100 UNIT/ML Pen     . lisinopril (ZESTRIL) 10 MG tablet     . lisinopril (ZESTRIL) 20 MG tablet     . metFORMIN (GLUCOPHAGE-XR) 500 MG 24 hr tablet      No current facility-administered medications on file prior to visit.     No Known Allergies  Objective: There were no vitals filed for this visit.  General: No acute distress, AAOx3  Left foot: Staples intact with no gapping or dehiscence at surgical site, at the dorsal proximal and plantar proximal aspect of the I&D site the wound is healing by secondary intention with the wound base is granular all open wounds at the ends of the incision site are less than 0.5 cm and tunnels less than 0.5 cm deep with a granular base appears to be improving and slowly filling in at the first interspace with blanchable redness warmth drainage, pedal pulses are palpable no pain to palpation to left foot or calf.  Assessment and Plan:  Problem List Items Addressed This Visit    None    Visit Diagnoses    Post-operative state    -  Primary   Cellulitis and abscess of toe  of left foot       Type 2 diabetes mellitus with diabetic foot infection ([place])       Ulcer of left foot, with fat layer exposed ([place])          -Patient seen and evaluated -Applied packing and dry sterile dressing to surgical site left foot secured with ACE wrap and stockinet  -Advised patient to to keep dressing clean dry and intact and allow nursing to assist with dressing changes weekly -Refill Augmentin to take as instructed and advised patient that likely blanchable erythema can be from him ambulating too much and keeping his foot or leg down it is very important to continue with rest and elevation to assist with edema and erythema control -Advised patient to continue with limiting activity and cam boot -Will plan for wound check with Dr. March Rummage at next office visit. In the meantime, patient to call office if any issues or problems arise.   Landis Martins, DPM

## 2019-06-13 DIAGNOSIS — L03116 Cellulitis of left lower limb: Secondary | ICD-10-CM | POA: Diagnosis not present

## 2019-06-13 DIAGNOSIS — M868X7 Other osteomyelitis, ankle and foot: Secondary | ICD-10-CM | POA: Diagnosis not present

## 2019-06-13 DIAGNOSIS — L02612 Cutaneous abscess of left foot: Secondary | ICD-10-CM | POA: Diagnosis not present

## 2019-06-13 DIAGNOSIS — I10 Essential (primary) hypertension: Secondary | ICD-10-CM | POA: Diagnosis not present

## 2019-06-13 DIAGNOSIS — L97529 Non-pressure chronic ulcer of other part of left foot with unspecified severity: Secondary | ICD-10-CM | POA: Diagnosis not present

## 2019-06-13 DIAGNOSIS — E11621 Type 2 diabetes mellitus with foot ulcer: Secondary | ICD-10-CM | POA: Diagnosis not present

## 2019-06-20 DIAGNOSIS — I10 Essential (primary) hypertension: Secondary | ICD-10-CM | POA: Diagnosis not present

## 2019-06-20 DIAGNOSIS — L97529 Non-pressure chronic ulcer of other part of left foot with unspecified severity: Secondary | ICD-10-CM | POA: Diagnosis not present

## 2019-06-20 DIAGNOSIS — E11621 Type 2 diabetes mellitus with foot ulcer: Secondary | ICD-10-CM | POA: Diagnosis not present

## 2019-06-20 DIAGNOSIS — L03116 Cellulitis of left lower limb: Secondary | ICD-10-CM | POA: Diagnosis not present

## 2019-06-20 DIAGNOSIS — L02612 Cutaneous abscess of left foot: Secondary | ICD-10-CM | POA: Diagnosis not present

## 2019-06-20 DIAGNOSIS — M868X7 Other osteomyelitis, ankle and foot: Secondary | ICD-10-CM | POA: Diagnosis not present

## 2019-06-24 ENCOUNTER — Telehealth: Payer: Self-pay | Admitting: *Deleted

## 2019-06-24 ENCOUNTER — Ambulatory Visit (INDEPENDENT_AMBULATORY_CARE_PROVIDER_SITE_OTHER): Payer: Medicare Other | Admitting: Podiatry

## 2019-06-24 ENCOUNTER — Other Ambulatory Visit: Payer: Self-pay

## 2019-06-24 ENCOUNTER — Encounter: Payer: Self-pay | Admitting: Podiatry

## 2019-06-24 VITALS — Temp 98.4°F | Resp 16

## 2019-06-24 DIAGNOSIS — I159 Secondary hypertension, unspecified: Secondary | ICD-10-CM

## 2019-06-24 DIAGNOSIS — E1142 Type 2 diabetes mellitus with diabetic polyneuropathy: Secondary | ICD-10-CM

## 2019-06-24 DIAGNOSIS — L97522 Non-pressure chronic ulcer of other part of left foot with fat layer exposed: Secondary | ICD-10-CM | POA: Diagnosis not present

## 2019-06-24 NOTE — Patient Instructions (Signed)
Pre-Operative Instructions  Congratulations, you have decided to take an important step towards improving your quality of life.  You can be assured that the doctors and staff at [facility] will be with you every step of the way.  Here are some important things you should know:  1. Plan to be at the surgery center/hospital at least 1 (one) hour prior to your scheduled time, unless otherwise directed by the surgical center/hospital staff.  You must have a responsible adult accompany you, remain during the surgery and drive you home.  Make sure you have directions to the surgical center/hospital to ensure you arrive on time. 2. If you are having surgery at [facility] or [facility] hospitals, you will need a copy of your medical history and physical form from your family physician within one month prior to the date of surgery. We will give you a form for your primary physician to complete.  3. We make every effort to accommodate the date you request for surgery.  However, there are times where surgery dates or times have to be moved.  We will contact you as soon as possible if a change in schedule is required.   4. No aspirin/ibuprofen for one week before surgery.  If you are on aspirin, any non-steroidal anti-inflammatory medications (Mobic, Aleve, Ibuprofen) should not be taken seven (7) days prior to your surgery.  You make take Tylenol for pain prior to surgery.  5. Medications - If you are taking daily heart and blood pressure medications, seizure, reflux, allergy, asthma, anxiety, pain or diabetes medications, make sure you notify the surgery center/hospital before the day of surgery so they can tell you which medications you should take or avoid the day of surgery. 6. No food or drink after midnight the night before surgery unless directed otherwise by surgical center/hospital staff. 7. No alcoholic beverages 24-hours prior to surgery.  No smoking 24-hours prior or 24-hours after  surgery. 8. Wear loose pants or shorts. They should be loose enough to fit over bandages, boots, and casts. 9. Don't wear slip-on shoes. Sneakers are preferred. 10. Bring your boot with you to the surgery center/hospital.  Also bring crutches or a walker if your physician has prescribed it for you.  If you do not have this equipment, it will be provided for you after surgery. 11. If you have not been contacted by the surgery center/hospital by the day before your surgery, call to confirm the date and time of your surgery. 12. Leave-time from work may vary depending on the type of surgery you have.  Appropriate arrangements should be made prior to surgery with your employer. 13. Prescriptions will be provided immediately following surgery by your doctor.  Fill these as soon as possible after surgery and take the medication as directed. Pain medications will not be refilled on weekends and must be approved by the doctor. 14. Remove nail polish on the operative foot and avoid getting pedicures prior to surgery. 15. Wash the night before surgery.  The night before surgery wash the foot and leg well with water and the antibacterial soap provided. Be sure to pay special attention to beneath the toenails and in between the toes.  Wash for at least three (3) minutes. Rinse thoroughly with water and dry well with a towel.  Perform this wash unless told not to do so by your physician.  Enclosed: 1 Ice pack (please put in freezer the night before surgery)   1 Hibiclens skin cleaner     Pre-op instructions  If you have any questions regarding the instructions, please do not hesitate to call our office.  [place]: [address], [place], [place] [address] -- [phone]  [place]: [address] [place], [place] [address] -- [phone]  [place]: [address]  [place], [place] [address] -- [phone]  [place]: [address], [address], [place],  [address] -- [phone]  Website:  [email or web address] 

## 2019-06-24 NOTE — Progress Notes (Signed)
Quiet  Subjective:  Patient ID: James Palos., male    DOB: Dec 08, 1940,  MRN: [id number]  Chief Complaint  Patient presents with  . Routine Post Op    PV #6 Pt. states," been doing really good. Wound on the tope completely closed up; 1-2/10 neuropathy pain." Tx: boot, elevation and dressing change   . Diabetes    FBS: 99 A1C: unkown    78 y.o. male presents for post-op follow up. Hx as above.   Review of Systems: Negative except as noted in the HPI. Denies N/V/F/Ch.  No past medical history on file.  Current Outpatient Medications:  .  amLODipine (NORVASC) 5 MG tablet, , Disp: , Rfl:  .  amoxicillin-clavulanate (AUGMENTIN) 875-125 MG tablet, Take 1 tablet by mouth 2 (two) times daily., Disp: 14 tablet, Rfl: 0 .  atorvastatin (LIPITOR) 80 MG tablet, , Disp: , Rfl:  .  LEVEMIR FLEXTOUCH 100 UNIT/ML Pen, , Disp: , Rfl:  .  lisinopril (ZESTRIL) 10 MG tablet, , Disp: , Rfl:  .  lisinopril (ZESTRIL) 20 MG tablet, , Disp: , Rfl:  .  metFORMIN (GLUCOPHAGE-XR) 500 MG 24 hr tablet, , Disp: , Rfl:   Social History   Tobacco Use  Smoking Status Never Smoker  Smokeless Tobacco Never Used    No Known Allergies Objective:   Vitals:   06/24/19 0852  Resp: 16  Temp: 98.4 F (36.9 C)  BP 200/94 Pulse 84  There is no height or weight on file to calculate BMI. Constitutional Well developed. Well nourished.  Vascular Dorsalis pedis pulses palpable bilaterally. Posterior tibial pulses palpable bilaterally. Capillary refill normal to all digits.  No cyanosis or clubbing noted. Pedal hair growth normal.  Neurologic Normal speech. Oriented to person, place, and time. Protective sensation absent  Dermatologic Incision at L 1st interspace well healed with only small area of residual granulation tissue.  Plantar 1st MPJ ulcer with tunneling. Wound measures 1x0.5x1   Orthopedic: No pain to palpation either foot.   Radiographs: None Assessment:   1. Ulcer of left foot, with  fat layer exposed ([place])   2. DM type 2 with diabetic peripheral neuropathy ([place])   3. Secondary hypertension    Plan:  Patient was evaluated and treated and all questions answered.  S/p L 1st Interspace debridement and closure -Healing well, sutures and staples removed, one small area of granulation tissue closed with silver nitrate.  Plantar L 1st Metatarsal Wound -Wound improving, still with some tunneling. Wound edges debrided, repacked today with iodoform packing. Patient to continue. -Discussed with patient that the wound appears amenable to closure. Will plan for operative debridement and closure. He will need a complex closure due to the slight amount of tunneling. Consent forms reviewed and signed. Will plan for procedure next week. -Will get PCP clearance prior to procedure.  No follow-ups on file.

## 2019-06-24 NOTE — Telephone Encounter (Signed)
"  I am supposed to call you about you making an appointment for me for Dr. March Rummage to do my surgery the first part of next week.  When you get this message call me back on this cell number.  That's what you done before.  I'll talk to you later on in the day."

## 2019-06-25 NOTE — Telephone Encounter (Signed)
"  I am calling again.  I never got an appointment with Dr. March Rummage.  Let me know."

## 2019-06-26 NOTE — Telephone Encounter (Signed)
I am returning your call.  Can we do your surgery on July 02, 2019?  "Yes, the 18th will be fine."  Have you had your history and physical form completed by your primary care doctor?  "Yes, I picked it back up from my doctor this morning and took it over to your office.  What time will I need to be there?"  You will get a call from pre-admissions, they will give you your arrival time.  Your surgery will start at 12N but you will probably need to be there about two hours prior to that time.

## 2019-06-27 DIAGNOSIS — L03116 Cellulitis of left lower limb: Secondary | ICD-10-CM | POA: Diagnosis not present

## 2019-06-27 DIAGNOSIS — I10 Essential (primary) hypertension: Secondary | ICD-10-CM | POA: Diagnosis not present

## 2019-06-27 DIAGNOSIS — M868X7 Other osteomyelitis, ankle and foot: Secondary | ICD-10-CM | POA: Diagnosis not present

## 2019-06-27 DIAGNOSIS — L97529 Non-pressure chronic ulcer of other part of left foot with unspecified severity: Secondary | ICD-10-CM | POA: Diagnosis not present

## 2019-06-27 DIAGNOSIS — E11621 Type 2 diabetes mellitus with foot ulcer: Secondary | ICD-10-CM | POA: Diagnosis not present

## 2019-06-27 DIAGNOSIS — L02612 Cutaneous abscess of left foot: Secondary | ICD-10-CM | POA: Diagnosis not present

## 2019-07-02 ENCOUNTER — Other Ambulatory Visit: Payer: Self-pay | Admitting: Podiatry

## 2019-07-02 DIAGNOSIS — I10 Essential (primary) hypertension: Secondary | ICD-10-CM | POA: Diagnosis not present

## 2019-07-02 DIAGNOSIS — L97522 Non-pressure chronic ulcer of other part of left foot with fat layer exposed: Secondary | ICD-10-CM | POA: Diagnosis not present

## 2019-07-02 DIAGNOSIS — E11621 Type 2 diabetes mellitus with foot ulcer: Secondary | ICD-10-CM | POA: Diagnosis not present

## 2019-07-02 DIAGNOSIS — L97526 Non-pressure chronic ulcer of other part of left foot with bone involvement without evidence of necrosis: Secondary | ICD-10-CM | POA: Diagnosis not present

## 2019-07-02 DIAGNOSIS — Z8719 Personal history of other diseases of the digestive system: Secondary | ICD-10-CM | POA: Diagnosis not present

## 2019-07-02 DIAGNOSIS — E1142 Type 2 diabetes mellitus with diabetic polyneuropathy: Secondary | ICD-10-CM | POA: Diagnosis not present

## 2019-07-02 DIAGNOSIS — E785 Hyperlipidemia, unspecified: Secondary | ICD-10-CM | POA: Diagnosis not present

## 2019-07-02 MED ORDER — CEPHALEXIN 500 MG PO CAPS: 500.0000 mg | ORAL_CAPSULE | Freq: Two times a day (BID) | ORAL | 0 refills | Status: DC

## 2019-07-02 NOTE — Progress Notes (Signed)
Rx keflex sent to pharmacy for outpatient surgery

## 2019-07-02 NOTE — Progress Notes (Signed)
Patient's boot was in state of disrepair and was no longer safe for him to use. A new boot was dispensed to the patient to provide for safe off-loading of the ulceration and for post-operative use. The boot is medically necessary.  Evelina Bucy 07/02/19

## 2019-07-04 ENCOUNTER — Telehealth: Payer: Self-pay

## 2019-07-04 DIAGNOSIS — L03116 Cellulitis of left lower limb: Secondary | ICD-10-CM | POA: Diagnosis not present

## 2019-07-04 DIAGNOSIS — E11621 Type 2 diabetes mellitus with foot ulcer: Secondary | ICD-10-CM | POA: Diagnosis not present

## 2019-07-04 DIAGNOSIS — L02612 Cutaneous abscess of left foot: Secondary | ICD-10-CM | POA: Diagnosis not present

## 2019-07-04 DIAGNOSIS — L97529 Non-pressure chronic ulcer of other part of left foot with unspecified severity: Secondary | ICD-10-CM | POA: Diagnosis not present

## 2019-07-04 DIAGNOSIS — I10 Essential (primary) hypertension: Secondary | ICD-10-CM | POA: Diagnosis not present

## 2019-07-04 DIAGNOSIS — D519 Vitamin B12 deficiency anemia, unspecified: Secondary | ICD-10-CM | POA: Diagnosis not present

## 2019-07-04 DIAGNOSIS — M868X7 Other osteomyelitis, ankle and foot: Secondary | ICD-10-CM | POA: Diagnosis not present

## 2019-07-04 NOTE — Telephone Encounter (Signed)
Called Sharyn Lull back and advised her of the Dr.'s order.

## 2019-07-04 NOTE — Telephone Encounter (Signed)
Change bandage this week and apply betadine wet to dry

## 2019-07-04 NOTE — Telephone Encounter (Signed)
Sharyn Lull from [facility] called needing orders for Mr. Corvin. Please advise

## 2019-07-05 DIAGNOSIS — Z48817 Encounter for surgical aftercare following surgery on the skin and subcutaneous tissue: Secondary | ICD-10-CM | POA: Diagnosis not present

## 2019-07-05 DIAGNOSIS — Z7984 Long term (current) use of oral hypoglycemic drugs: Secondary | ICD-10-CM | POA: Diagnosis not present

## 2019-07-05 DIAGNOSIS — Z79899 Other long term (current) drug therapy: Secondary | ICD-10-CM | POA: Diagnosis not present

## 2019-07-05 DIAGNOSIS — E782 Mixed hyperlipidemia: Secondary | ICD-10-CM | POA: Diagnosis not present

## 2019-07-05 DIAGNOSIS — E11621 Type 2 diabetes mellitus with foot ulcer: Secondary | ICD-10-CM | POA: Diagnosis not present

## 2019-07-05 DIAGNOSIS — Z794 Long term (current) use of insulin: Secondary | ICD-10-CM | POA: Diagnosis not present

## 2019-07-05 DIAGNOSIS — Z792 Long term (current) use of antibiotics: Secondary | ICD-10-CM | POA: Diagnosis not present

## 2019-07-05 DIAGNOSIS — L02612 Cutaneous abscess of left foot: Secondary | ICD-10-CM | POA: Diagnosis not present

## 2019-07-05 DIAGNOSIS — Z7982 Long term (current) use of aspirin: Secondary | ICD-10-CM | POA: Diagnosis not present

## 2019-07-05 DIAGNOSIS — I10 Essential (primary) hypertension: Secondary | ICD-10-CM | POA: Diagnosis not present

## 2019-07-05 DIAGNOSIS — M868X7 Other osteomyelitis, ankle and foot: Secondary | ICD-10-CM | POA: Diagnosis not present

## 2019-07-05 DIAGNOSIS — L97529 Non-pressure chronic ulcer of other part of left foot with unspecified severity: Secondary | ICD-10-CM | POA: Diagnosis not present

## 2019-07-05 DIAGNOSIS — B9561 Methicillin susceptible Staphylococcus aureus infection as the cause of diseases classified elsewhere: Secondary | ICD-10-CM | POA: Diagnosis not present

## 2019-07-05 DIAGNOSIS — L03116 Cellulitis of left lower limb: Secondary | ICD-10-CM | POA: Diagnosis not present

## 2019-07-09 ENCOUNTER — Encounter: Payer: Self-pay | Admitting: Podiatry

## 2019-07-09 ENCOUNTER — Ambulatory Visit (INDEPENDENT_AMBULATORY_CARE_PROVIDER_SITE_OTHER): Payer: Medicare Other | Admitting: Podiatry

## 2019-07-09 ENCOUNTER — Other Ambulatory Visit: Payer: Self-pay

## 2019-07-09 VITALS — Temp 100.1°F | Resp 16

## 2019-07-09 DIAGNOSIS — Z9889 Other specified postprocedural states: Secondary | ICD-10-CM

## 2019-07-11 DIAGNOSIS — Z48817 Encounter for surgical aftercare following surgery on the skin and subcutaneous tissue: Secondary | ICD-10-CM | POA: Diagnosis not present

## 2019-07-11 DIAGNOSIS — M868X7 Other osteomyelitis, ankle and foot: Secondary | ICD-10-CM | POA: Diagnosis not present

## 2019-07-11 DIAGNOSIS — E11621 Type 2 diabetes mellitus with foot ulcer: Secondary | ICD-10-CM | POA: Diagnosis not present

## 2019-07-11 DIAGNOSIS — L97529 Non-pressure chronic ulcer of other part of left foot with unspecified severity: Secondary | ICD-10-CM | POA: Diagnosis not present

## 2019-07-11 DIAGNOSIS — L02612 Cutaneous abscess of left foot: Secondary | ICD-10-CM | POA: Diagnosis not present

## 2019-07-11 DIAGNOSIS — L03116 Cellulitis of left lower limb: Secondary | ICD-10-CM | POA: Diagnosis not present

## 2019-07-15 ENCOUNTER — Other Ambulatory Visit: Payer: Self-pay

## 2019-07-15 ENCOUNTER — Encounter: Payer: Self-pay | Admitting: Podiatry

## 2019-07-15 ENCOUNTER — Ambulatory Visit (INDEPENDENT_AMBULATORY_CARE_PROVIDER_SITE_OTHER): Payer: Medicare Other | Admitting: Podiatry

## 2019-07-15 VITALS — Temp 98.6°F | Resp 16

## 2019-07-15 DIAGNOSIS — Z9889 Other specified postprocedural states: Secondary | ICD-10-CM

## 2019-07-15 MED ORDER — SILVER SULFADIAZINE 1 % EX CREA: TOPICAL_CREAM | CUTANEOUS | 0 refills | Status: DC

## 2019-07-15 NOTE — Progress Notes (Signed)
  Subjective:  Patient ID: James Solo., male    DOB: 02/17/1941,  MRN: [id number]  Chief Complaint  Patient presents with  . Routine Post Op    POV#2 DOS 07/02/2019 REPAIR COMPLEX WOUND LT -pt states everything is healing up well and he has noticed less drainage w/ no swelling  Tx: boot, IBU   . Diabetes    FBS: 26    78 y.o. male returns for post-op check. Hx as above.  Review of Systems: Negative except as noted in the HPI. Denies N/V/F/Ch.  No past medical history on file.  Current Outpatient Medications:  .  amLODipine (NORVASC) 5 MG tablet, , Disp: , Rfl:  .  atorvastatin (LIPITOR) 80 MG tablet, , Disp: , Rfl:  .  cephALEXin (KEFLEX) 500 MG capsule, Take 1 capsule (500 mg total) by mouth 2 (two) times daily., Disp: 14 capsule, Rfl: 0 .  LEVEMIR FLEXTOUCH 100 UNIT/ML Pen, , Disp: , Rfl:  .  lisinopril (ZESTRIL) 10 MG tablet, , Disp: , Rfl:  .  lisinopril (ZESTRIL) 20 MG tablet, , Disp: , Rfl:  .  metFORMIN (GLUCOPHAGE-XR) 500 MG 24 hr tablet, , Disp: , Rfl:  .  silver sulfADIAZINE (SILVADENE) 1 % cream, Apply pea-sized amount to wound daily., Disp: 50 g, Rfl: 0  Social History   Tobacco Use  Smoking Status Never Smoker  Smokeless Tobacco Never Used    No Known Allergies Objective:   Vitals:   07/15/19 1340  Resp: 16  Temp: 98.6 F (37 C)   There is no height or weight on file to calculate BMI. Constitutional Well developed. Well nourished.  Vascular Foot warm and well perfused. Capillary refill normal to all digits.   Neurologic Normal speech. Oriented to person, place, and time. Epicritic sensation to light touch grossly present bilaterally.  Dermatologic Skin healing well plantarly. Sutures intact, skin well coapted. No signs of acute infection. Skin healed dorsally.  Orthopedic: Tenderness to palpation noted about the surgical site.   Radiographs: None Assessment:   1. Post-operative state    Plan:  Patient was evaluated and treated and all  questions answered.  S/p foot surgery right -Progressing as expected post-operatively. -XR: None -WB Status: WBAT in surgical shoe -Sutures: intact. -Medications: none -Orders for [facility] to remove sutures on 9/9. Apply abx ointment thereafter -F/u in 3 weeks for recheck. -Foot redressed.  No follow-ups on file.

## 2019-07-18 DIAGNOSIS — L97529 Non-pressure chronic ulcer of other part of left foot with unspecified severity: Secondary | ICD-10-CM | POA: Diagnosis not present

## 2019-07-18 DIAGNOSIS — E11621 Type 2 diabetes mellitus with foot ulcer: Secondary | ICD-10-CM | POA: Diagnosis not present

## 2019-07-18 DIAGNOSIS — Z48817 Encounter for surgical aftercare following surgery on the skin and subcutaneous tissue: Secondary | ICD-10-CM | POA: Diagnosis not present

## 2019-07-18 DIAGNOSIS — L02612 Cutaneous abscess of left foot: Secondary | ICD-10-CM | POA: Diagnosis not present

## 2019-07-18 DIAGNOSIS — L03116 Cellulitis of left lower limb: Secondary | ICD-10-CM | POA: Diagnosis not present

## 2019-07-18 DIAGNOSIS — M868X7 Other osteomyelitis, ankle and foot: Secondary | ICD-10-CM | POA: Diagnosis not present

## 2019-07-25 DIAGNOSIS — E11621 Type 2 diabetes mellitus with foot ulcer: Secondary | ICD-10-CM | POA: Diagnosis not present

## 2019-07-25 DIAGNOSIS — Z48817 Encounter for surgical aftercare following surgery on the skin and subcutaneous tissue: Secondary | ICD-10-CM | POA: Diagnosis not present

## 2019-07-25 DIAGNOSIS — M868X7 Other osteomyelitis, ankle and foot: Secondary | ICD-10-CM | POA: Diagnosis not present

## 2019-07-25 DIAGNOSIS — L03116 Cellulitis of left lower limb: Secondary | ICD-10-CM | POA: Diagnosis not present

## 2019-07-25 DIAGNOSIS — L97529 Non-pressure chronic ulcer of other part of left foot with unspecified severity: Secondary | ICD-10-CM | POA: Diagnosis not present

## 2019-07-25 DIAGNOSIS — L02612 Cutaneous abscess of left foot: Secondary | ICD-10-CM | POA: Diagnosis not present

## 2019-07-31 DIAGNOSIS — M868X7 Other osteomyelitis, ankle and foot: Secondary | ICD-10-CM | POA: Diagnosis not present

## 2019-07-31 DIAGNOSIS — Z48817 Encounter for surgical aftercare following surgery on the skin and subcutaneous tissue: Secondary | ICD-10-CM | POA: Diagnosis not present

## 2019-07-31 DIAGNOSIS — L97529 Non-pressure chronic ulcer of other part of left foot with unspecified severity: Secondary | ICD-10-CM | POA: Diagnosis not present

## 2019-07-31 DIAGNOSIS — L03116 Cellulitis of left lower limb: Secondary | ICD-10-CM | POA: Diagnosis not present

## 2019-07-31 DIAGNOSIS — L02612 Cutaneous abscess of left foot: Secondary | ICD-10-CM | POA: Diagnosis not present

## 2019-07-31 DIAGNOSIS — E11621 Type 2 diabetes mellitus with foot ulcer: Secondary | ICD-10-CM | POA: Diagnosis not present

## 2019-08-04 DIAGNOSIS — L02612 Cutaneous abscess of left foot: Secondary | ICD-10-CM | POA: Diagnosis not present

## 2019-08-04 DIAGNOSIS — B9561 Methicillin susceptible Staphylococcus aureus infection as the cause of diseases classified elsewhere: Secondary | ICD-10-CM | POA: Diagnosis not present

## 2019-08-04 DIAGNOSIS — M868X7 Other osteomyelitis, ankle and foot: Secondary | ICD-10-CM | POA: Diagnosis not present

## 2019-08-04 DIAGNOSIS — I10 Essential (primary) hypertension: Secondary | ICD-10-CM | POA: Diagnosis not present

## 2019-08-04 DIAGNOSIS — Z48817 Encounter for surgical aftercare following surgery on the skin and subcutaneous tissue: Secondary | ICD-10-CM | POA: Diagnosis not present

## 2019-08-04 DIAGNOSIS — Z7984 Long term (current) use of oral hypoglycemic drugs: Secondary | ICD-10-CM | POA: Diagnosis not present

## 2019-08-04 DIAGNOSIS — E782 Mixed hyperlipidemia: Secondary | ICD-10-CM | POA: Diagnosis not present

## 2019-08-04 DIAGNOSIS — E11621 Type 2 diabetes mellitus with foot ulcer: Secondary | ICD-10-CM | POA: Diagnosis not present

## 2019-08-04 DIAGNOSIS — L97529 Non-pressure chronic ulcer of other part of left foot with unspecified severity: Secondary | ICD-10-CM | POA: Diagnosis not present

## 2019-08-04 DIAGNOSIS — Z794 Long term (current) use of insulin: Secondary | ICD-10-CM | POA: Diagnosis not present

## 2019-08-04 DIAGNOSIS — L03116 Cellulitis of left lower limb: Secondary | ICD-10-CM | POA: Diagnosis not present

## 2019-08-04 DIAGNOSIS — Z792 Long term (current) use of antibiotics: Secondary | ICD-10-CM | POA: Diagnosis not present

## 2019-08-04 DIAGNOSIS — Z79899 Other long term (current) drug therapy: Secondary | ICD-10-CM | POA: Diagnosis not present

## 2019-08-04 DIAGNOSIS — Z7982 Long term (current) use of aspirin: Secondary | ICD-10-CM | POA: Diagnosis not present

## 2019-08-05 DIAGNOSIS — D519 Vitamin B12 deficiency anemia, unspecified: Secondary | ICD-10-CM | POA: Diagnosis not present

## 2019-08-05 DIAGNOSIS — E785 Hyperlipidemia, unspecified: Secondary | ICD-10-CM | POA: Diagnosis not present

## 2019-08-05 DIAGNOSIS — D509 Iron deficiency anemia, unspecified: Secondary | ICD-10-CM | POA: Diagnosis not present

## 2019-08-05 DIAGNOSIS — E1140 Type 2 diabetes mellitus with diabetic neuropathy, unspecified: Secondary | ICD-10-CM | POA: Diagnosis not present

## 2019-08-06 ENCOUNTER — Other Ambulatory Visit: Payer: Self-pay

## 2019-08-06 ENCOUNTER — Ambulatory Visit (INDEPENDENT_AMBULATORY_CARE_PROVIDER_SITE_OTHER): Payer: Self-pay | Admitting: Podiatry

## 2019-08-06 DIAGNOSIS — Z9889 Other specified postprocedural states: Secondary | ICD-10-CM

## 2019-08-06 NOTE — Progress Notes (Signed)
  Subjective:  Patient ID: James Norton., male    DOB: 04-Aug-1941,  MRN: [id number]  Chief Complaint  Patient presents with  . Wound Check    F?U Lt wound check Pt. states," been doing good, no problem at all, getting around pretty good." Tx: sx shoe and abx cream -pt denies pain or any significant draiange/swellign -pt denies N/V/F/Ch   . Diabetes    FBS: 83    78 y.o. male returns for post-op check. Hx as above.   Review of Systems: Negative except as noted in the HPI. Denies N/V/F/Ch.  No past medical history on file.  Current Outpatient Medications:  .  amLODipine (NORVASC) 5 MG tablet, , Disp: , Rfl:  .  atorvastatin (LIPITOR) 80 MG tablet, , Disp: , Rfl:  .  cephALEXin (KEFLEX) 500 MG capsule, Take 1 capsule (500 mg total) by mouth 2 (two) times daily., Disp: 14 capsule, Rfl: 0 .  LEVEMIR FLEXTOUCH 100 UNIT/ML Pen, , Disp: , Rfl:  .  lisinopril (ZESTRIL) 10 MG tablet, , Disp: , Rfl:  .  lisinopril (ZESTRIL) 20 MG tablet, , Disp: , Rfl:  .  metFORMIN (GLUCOPHAGE-XR) 500 MG 24 hr tablet, , Disp: , Rfl:  .  silver sulfADIAZINE (SILVADENE) 1 % cream, Apply pea-sized amount to wound daily., Disp: 50 g, Rfl: 0  Social History   Tobacco Use  Smoking Status Never Smoker  Smokeless Tobacco Never Used    No Known Allergies Objective:   There were no vitals filed for this visit. There is no height or weight on file to calculate BMI. Constitutional Well developed. Well nourished.  Vascular Foot warm and well perfused. Capillary refill normal to all digits.   Neurologic Normal speech. Oriented to person, place, and time. Epicritic sensation to light touch grossly present bilaterally.  Dermatologic Skin healing well plantarly with only small residual area of incomplete healing. No warmth erythema signs of infection. Skin healed dorsally.  Orthopedic: Tenderness to palpation noted about the surgical site.   Radiographs: None Assessment:   1. Post-operative state     Plan:  Patient was evaluated and treated and all questions answered.  S/p foot surgery right -Progressing as expected post-operatively. -XR: None -WB Status: WBAT in surgical shoe -Sutures: out. -Continue abx ointment -Transition out of sx shoe in 1 week into normal shoegear -Offload wound with padding  Return in about 2 weeks (around 08/20/2019) for Wound Care.

## 2019-08-08 ENCOUNTER — Telehealth: Payer: Self-pay

## 2019-08-08 DIAGNOSIS — Z48817 Encounter for surgical aftercare following surgery on the skin and subcutaneous tissue: Secondary | ICD-10-CM | POA: Diagnosis not present

## 2019-08-08 DIAGNOSIS — L97529 Non-pressure chronic ulcer of other part of left foot with unspecified severity: Secondary | ICD-10-CM | POA: Diagnosis not present

## 2019-08-08 DIAGNOSIS — L02612 Cutaneous abscess of left foot: Secondary | ICD-10-CM | POA: Diagnosis not present

## 2019-08-08 DIAGNOSIS — E11621 Type 2 diabetes mellitus with foot ulcer: Secondary | ICD-10-CM | POA: Diagnosis not present

## 2019-08-08 DIAGNOSIS — M868X7 Other osteomyelitis, ankle and foot: Secondary | ICD-10-CM | POA: Diagnosis not present

## 2019-08-08 DIAGNOSIS — L03116 Cellulitis of left lower limb: Secondary | ICD-10-CM | POA: Diagnosis not present

## 2019-08-08 NOTE — Telephone Encounter (Signed)
Sharyn Lull from [facility] called stating that today she was going to discharge Mr. James Norton, but she noticed a small water blister on his foot which she applied silver cream. Sharyn Lull stated that she needs to keep checking on the pt for a couple more weeks.

## 2019-08-09 DIAGNOSIS — E1165 Type 2 diabetes mellitus with hyperglycemia: Secondary | ICD-10-CM | POA: Diagnosis not present

## 2019-08-09 DIAGNOSIS — Z139 Encounter for screening, unspecified: Secondary | ICD-10-CM | POA: Diagnosis not present

## 2019-08-09 DIAGNOSIS — I10 Essential (primary) hypertension: Secondary | ICD-10-CM | POA: Diagnosis not present

## 2019-08-09 DIAGNOSIS — E785 Hyperlipidemia, unspecified: Secondary | ICD-10-CM | POA: Diagnosis not present

## 2019-08-09 DIAGNOSIS — E1140 Type 2 diabetes mellitus with diabetic neuropathy, unspecified: Secondary | ICD-10-CM | POA: Diagnosis not present

## 2019-08-09 DIAGNOSIS — Z23 Encounter for immunization: Secondary | ICD-10-CM | POA: Diagnosis not present

## 2019-08-09 DIAGNOSIS — D509 Iron deficiency anemia, unspecified: Secondary | ICD-10-CM | POA: Diagnosis not present

## 2019-08-09 NOTE — Telephone Encounter (Signed)
Ok

## 2019-08-11 NOTE — Progress Notes (Signed)
Quiet  Subjective:  Patient ID: James Luginbill., male    DOB: [birth year],  MRN: [id number]  Chief Complaint  Patient presents with  . Routine Post Op    POV#1 DOS 07/02/2019 REPAIR COMPLEX WOUND LT -Pt. states," doing fine." -pt denies pain/problems Tx: cam boot, keflex ( 1 more tab left) -p denies N/V/F/Ch    DOS 07/02/2019 REPAIR COMPLEX WOUND LT  78 y.o. male presents for post-op follow up. Hx as above. Doing well post-operatively. No new issues.   Review of Systems: Negative except as noted in the HPI. Denies N/V/F/Ch.  No past medical history on file.  Current Outpatient Medications:  .  amLODipine (NORVASC) 5 MG tablet, , Disp: , Rfl:  .  atorvastatin (LIPITOR) 80 MG tablet, , Disp: , Rfl:  .  cephALEXin (KEFLEX) 500 MG capsule, Take 1 capsule (500 mg total) by mouth 2 (two) times daily., Disp: 14 capsule, Rfl: 0 .  LEVEMIR FLEXTOUCH 100 UNIT/ML Pen, , Disp: , Rfl:  .  lisinopril (ZESTRIL) 10 MG tablet, , Disp: , Rfl:  .  lisinopril (ZESTRIL) 20 MG tablet, , Disp: , Rfl:  .  metFORMIN (GLUCOPHAGE-XR) 500 MG 24 hr tablet, , Disp: , Rfl:  .  silver sulfADIAZINE (SILVADENE) 1 % cream, Apply pea-sized amount to wound daily., Disp: 50 g, Rfl: 0  Social History   Tobacco Use  Smoking Status Never Smoker  Smokeless Tobacco Never Used    No Known Allergies Objective:   Vitals:   07/09/19 1439  Resp: 16  Temp: 100.1 F (37.8 C)  BP 200/94 Pulse 84  There is no height or weight on file to calculate BMI. Constitutional Well developed. Well nourished.  Vascular Dorsalis pedis pulses palpable bilaterally. Posterior tibial pulses palpable bilaterally. Capillary refill normal to all digits.  No cyanosis or clubbing noted. Pedal hair growth normal.  Neurologic Normal speech. Oriented to person, place, and time. Protective sensation absent  Dermatologic Interspace/dorsal incision healed  Plantar wound well coapted with intact suture no warmth erythema signs of  infection  Orthopedic: No pain to palpation either foot.   Radiographs: None Assessment:   1. Post-operative state    Plan:  Patient was evaluated and treated and all questions answered.  S/p L 1st Interspace debridement and closure -Healed  Plantar L 1st Metatarsal Wound -Healing well sutures intact redressed with Betadine and wet-to-dry dressing.  Follow-up in 1 week for recheck  No follow-ups on file.

## 2019-08-20 ENCOUNTER — Other Ambulatory Visit: Payer: Self-pay

## 2019-08-20 ENCOUNTER — Ambulatory Visit (INDEPENDENT_AMBULATORY_CARE_PROVIDER_SITE_OTHER): Payer: Self-pay | Admitting: Podiatry

## 2019-08-20 DIAGNOSIS — Z9889 Other specified postprocedural states: Secondary | ICD-10-CM

## 2019-09-05 DIAGNOSIS — D519 Vitamin B12 deficiency anemia, unspecified: Secondary | ICD-10-CM | POA: Diagnosis not present

## 2019-09-10 ENCOUNTER — Other Ambulatory Visit: Payer: Self-pay

## 2019-09-10 ENCOUNTER — Ambulatory Visit (INDEPENDENT_AMBULATORY_CARE_PROVIDER_SITE_OTHER): Payer: Medicare Other | Admitting: Podiatry

## 2019-09-10 DIAGNOSIS — L97522 Non-pressure chronic ulcer of other part of left foot with fat layer exposed: Secondary | ICD-10-CM | POA: Diagnosis not present

## 2019-09-10 NOTE — Progress Notes (Signed)
  Subjective:  Patient ID: James Solo., male    DOB: 1941/09/30,  MRN: [id number]  Chief Complaint  Patient presents with  . Wound Check    F/U Rt wound check Pt. states," it's been doing fine, waiting for it to close up completely. Still one little spot open." -pt denies pain/redness/swelling/drainage/N/V/F?CH Tx: bandaid and abx ointment   . Diabetes    FBS: 18    78 y.o. male returns for post-op check. Hx as above.   Review of Systems: Negative except as noted in the HPI. Denies N/V/F/Ch.  No past medical history on file.  Current Outpatient Medications:  .  amLODipine (NORVASC) 5 MG tablet, , Disp: , Rfl:  .  atorvastatin (LIPITOR) 80 MG tablet, , Disp: , Rfl:  .  LEVEMIR FLEXTOUCH 100 UNIT/ML Pen, , Disp: , Rfl:  .  lisinopril (ZESTRIL) 10 MG tablet, , Disp: , Rfl:  .  lisinopril (ZESTRIL) 20 MG tablet, , Disp: , Rfl:  .  metFORMIN (GLUCOPHAGE-XR) 500 MG 24 hr tablet, , Disp: , Rfl:  .  silver sulfADIAZINE (SILVADENE) 1 % cream, Apply pea-sized amount to wound daily., Disp: 50 g, Rfl: 0  Social History   Tobacco Use  Smoking Status Never Smoker  Smokeless Tobacco Never Used    No Known Allergies Objective:   There were no vitals filed for this visit. There is no height or weight on file to calculate BMI. Constitutional Well developed. Well nourished.  Vascular Foot warm and well perfused. Capillary refill normal to all digits.   Neurologic Normal speech. Oriented to person, place, and time. Epicritic sensation to light touch grossly present bilaterally.  Dermatologic Skin healed at surgical site Submet 1 pressure ulcer 1x0.5 hyperkeratotic border granular base no warmth no erythema no signs of acute infection Skin healed dorsally.  Orthopedic:  No tenderness to palpation noted about the surgical site.   Radiographs: None Assessment:   1. Ulcer of left foot, with fat layer exposed ([place])    Plan:  Patient was evaluated and treated and all questions  answered.  Right foot ulcer -At this point the original wound appears healed.  He has an adjacent submet 1 ulceration.  This was debrided as below; we will fabricate diabetic shoes to offload the area.  Follow-up in 1 month for recheck  Procedure: Excisional Debridement of Wound Rationale: Removal of non-viable soft tissue from the wound to promote healing.  Anesthesia: none Pre-Debridement Wound Measurements: 1 cm x 0.5 cm x 0.2 cm  Post-Debridement Wound Measurements: 1 cm x 0.8 cm x 0.2 cm  Type of Debridement: Sharp Excisional Tissue Removed: Non-viable soft tissue Depth of Debridement: subcutaneous tissue. Technique: Sharp excisional debridement to bleeding, viable wound base.  Dressing: Dry, sterile, compression dressing. Disposition: Patient tolerated procedure well. Patient to return in 1 week for follow-up.     No follow-ups on file.

## 2019-09-25 ENCOUNTER — Other Ambulatory Visit: Payer: Self-pay

## 2019-09-25 ENCOUNTER — Ambulatory Visit: Payer: Medicare Other | Admitting: Orthotics

## 2019-09-25 DIAGNOSIS — L97522 Non-pressure chronic ulcer of other part of left foot with fat layer exposed: Secondary | ICD-10-CM

## 2019-09-25 NOTE — Progress Notes (Signed)
Docs Only... Scanned Saved/order saved Richy until Dr. Delena Bali approves.. NO shoes only inserts

## 2019-10-07 ENCOUNTER — Telehealth: Payer: Self-pay

## 2019-10-07 ENCOUNTER — Other Ambulatory Visit: Payer: Self-pay | Admitting: Sports Medicine

## 2019-10-07 DIAGNOSIS — D519 Vitamin B12 deficiency anemia, unspecified: Secondary | ICD-10-CM | POA: Diagnosis not present

## 2019-10-07 MED ORDER — SULFAMETHOXAZOLE-TRIMETHOPRIM 400-80 MG PO TABS: 1.0000 | ORAL_TABLET | Freq: Two times a day (BID) | ORAL | 0 refills | Status: DC

## 2019-10-07 NOTE — Telephone Encounter (Signed)
I will call in antibiotic if worsens go to ER. Thanks Dr. Cannon Kettle

## 2019-10-07 NOTE — Telephone Encounter (Signed)
Called Pt and informed him of his antibiotic sent to his pharmacy. Pt stated understanding

## 2019-10-07 NOTE — Progress Notes (Signed)
Sent bactrim due to increase redness and drainage and advised patient if worsens to go to ER or to contact office -Dr. Chauncey Cruel

## 2019-10-07 NOTE — Telephone Encounter (Signed)
Dr. Eleanora Neighbor Pt came in this morning stating to be seen ASAP due to a new ulcer that's been draining. Pt denies any redness/swelling but is concern about the drainage. Pt would like to know if he can be seen this Wednesday or called in an antibiotic. Please advice

## 2019-10-14 NOTE — Progress Notes (Signed)
  Subjective:  Patient ID: James Solo., male    DOB: 22-Feb-1941,  MRN: [id number]  Chief Complaint  Patient presents with  . Wound Check    F?U Lt wound check pt. states his wound has been doing great, but he has a new spot that his padding was rubbing. Pt states the spot looks better today Tx: abx ointmetn and bandaid -pt denies redness/swelling/draiange    78 y.o. male returns for post-op check. Hx as above.   Review of Systems: Negative except as noted in the HPI. Denies N/V/F/Ch.  No past medical history on file.  Current Outpatient Medications:  .  amLODipine (NORVASC) 5 MG tablet, , Disp: , Rfl:  .  atorvastatin (LIPITOR) 80 MG tablet, , Disp: , Rfl:  .  LEVEMIR FLEXTOUCH 100 UNIT/ML Pen, , Disp: , Rfl:  .  lisinopril (ZESTRIL) 10 MG tablet, , Disp: , Rfl:  .  lisinopril (ZESTRIL) 20 MG tablet, , Disp: , Rfl:  .  metFORMIN (GLUCOPHAGE-XR) 500 MG 24 hr tablet, , Disp: , Rfl:  .  silver sulfADIAZINE (SILVADENE) 1 % cream, Apply pea-sized amount to wound daily., Disp: 50 g, Rfl: 0 .  sulfamethoxazole-trimethoprim (BACTRIM) 400-80 MG tablet, Take 1 tablet by mouth 2 (two) times daily., Disp: 28 tablet, Rfl: 0  Social History   Tobacco Use  Smoking Status Never Smoker  Smokeless Tobacco Never Used    No Known Allergies Objective:   There were no vitals filed for this visit. There is no height or weight on file to calculate BMI. Constitutional Well developed. Well nourished.  Vascular Foot warm and well perfused. Capillary refill normal to all digits.   Neurologic Normal speech. Oriented to person, place, and time. Epicritic sensation to light touch grossly present bilaterally.  Dermatologic Skin well healed plantarly. New adjacent abrasion. No warmth erythema signs ofinfection.  Orthopedic: No tenderness to palpation noted about the surgical site.   Radiographs: None Assessment:   1. Post-operative state    Plan:  Patient was evaluated and treated and all  questions answered.  S/p foot surgery right -Progressing as expected post-operatively. -XR: None -WB Status: WBAT in normal shoe. -Continue abx ointment until further healed. -Sutures: out.  No follow-ups on file.

## 2019-10-15 ENCOUNTER — Ambulatory Visit (INDEPENDENT_AMBULATORY_CARE_PROVIDER_SITE_OTHER): Payer: Medicare Other | Admitting: Podiatry

## 2019-10-15 ENCOUNTER — Other Ambulatory Visit: Payer: Self-pay

## 2019-10-15 DIAGNOSIS — L97522 Non-pressure chronic ulcer of other part of left foot with fat layer exposed: Secondary | ICD-10-CM

## 2019-10-27 NOTE — Progress Notes (Signed)
  Subjective:  Patient ID: James Solo., male    DOB: 1941/07/26,  MRN: [id number]  Chief Complaint  Patient presents with  . Wound Check    F/U Lt ulcer check Pt. states," been doing alright. The original wound is healed, the place underneath is getting there." Tx: bandaid and abx ointment -pt dneies redness/swelling -little spot of drainage    78 y.o. male returns for post-op check. Hx as above.   Review of Systems: Negative except as noted in the HPI. Denies N/V/F/Ch.  No past medical history on file.  Current Outpatient Medications:  .  amLODipine (NORVASC) 5 MG tablet, , Disp: , Rfl:  .  atorvastatin (LIPITOR) 80 MG tablet, , Disp: , Rfl:  .  LEVEMIR FLEXTOUCH 100 UNIT/ML Pen, , Disp: , Rfl:  .  lisinopril (ZESTRIL) 10 MG tablet, , Disp: , Rfl:  .  lisinopril (ZESTRIL) 20 MG tablet, , Disp: , Rfl:  .  metFORMIN (GLUCOPHAGE-XR) 500 MG 24 hr tablet, , Disp: , Rfl:  .  silver sulfADIAZINE (SILVADENE) 1 % cream, Apply pea-sized amount to wound daily., Disp: 50 g, Rfl: 0 .  sulfamethoxazole-trimethoprim (BACTRIM) 400-80 MG tablet, Take 1 tablet by mouth 2 (two) times daily., Disp: 28 tablet, Rfl: 0  Social History   Tobacco Use  Smoking Status Never Smoker  Smokeless Tobacco Never Used    No Known Allergies Objective:   There were no vitals filed for this visit. There is no height or weight on file to calculate BMI. Constitutional Well developed. Well nourished.  Vascular Foot warm and well perfused. Capillary refill normal to all digits.   Neurologic Normal speech. Oriented to person, place, and time. Epicritic sensation to light touch grossly present bilaterally.  Dermatologic Skin healed at surgical site Submet 1 pressure ulcer 1x0.5 hyperkeratotic border granular base no warmth no erythema no signs of acute infection Skin healed dorsally.  Orthopedic:  No tenderness to palpation noted about the surgical site.   Radiographs: None Assessment:   1. Ulcer of  left foot, with fat layer exposed ([place])    Plan:  Patient was evaluated and treated and all questions answered.  Right foot ulcer - ulcer debrided, secondary to pressure -Continue to offload the ulceration  Procedure: Selective Debridement of Wound Rationale: Removal of devitalized tissue from the wound to promote healing.  Pre-Debridement Wound Measurements: 1 cm x 0.5 cm x 0.2 cm  Post-Debridement Wound Measurements: same as pre-debridement. Type of Debridement: sharp selective Tissue Removed: Devitalized soft-tissue Dressing: Dry, sterile, compression dressing. Disposition: Patient tolerated procedure well. Patient to return in 1 week for follow-up.      Return in about 4 weeks (around 11/12/2019).

## 2019-11-12 ENCOUNTER — Ambulatory Visit (INDEPENDENT_AMBULATORY_CARE_PROVIDER_SITE_OTHER): Payer: Medicare Other | Admitting: Podiatry

## 2019-11-12 ENCOUNTER — Other Ambulatory Visit: Payer: Self-pay

## 2019-11-12 DIAGNOSIS — L97522 Non-pressure chronic ulcer of other part of left foot with fat layer exposed: Secondary | ICD-10-CM | POA: Diagnosis not present

## 2019-11-12 NOTE — Progress Notes (Signed)
  Subjective:  Patient ID: James Solo., male    DOB: 12/14/1940,  MRN: [id number]  Chief Complaint  Patient presents with  . Foot Ulcer    F/U Lt ulcer Pt. states," it's improved it just needs a little trimming." Tx: abx ointment and bandaid -pt denies pain/redness/swellign/drianage   . Diabetes    FBS: 85 A1C: 6.8    78 y.o. male presents for wound care. Hx as above.   Review of Systems: Negative except as noted in the HPI. Denies N/V/F/Ch.  No past medical history on file.  Current Outpatient Medications:  .  amLODipine (NORVASC) 5 MG tablet, , Disp: , Rfl:  .  atorvastatin (LIPITOR) 80 MG tablet, , Disp: , Rfl:  .  LEVEMIR FLEXTOUCH 100 UNIT/ML Pen, , Disp: , Rfl:  .  lisinopril (ZESTRIL) 10 MG tablet, , Disp: , Rfl:  .  lisinopril (ZESTRIL) 20 MG tablet, , Disp: , Rfl:  .  metFORMIN (GLUCOPHAGE-XR) 500 MG 24 hr tablet, , Disp: , Rfl:  .  silver sulfADIAZINE (SILVADENE) 1 % cream, Apply pea-sized amount to wound daily., Disp: 50 g, Rfl: 0 .  sulfamethoxazole-trimethoprim (BACTRIM) 400-80 MG tablet, Take 1 tablet by mouth 2 (two) times daily., Disp: 28 tablet, Rfl: 0  Social History   Tobacco Use  Smoking Status Never Smoker  Smokeless Tobacco Never Used    No Known Allergies Objective:  There were no vitals filed for this visit. There is no height or weight on file to calculate BMI. Constitutional Well developed. Well nourished.  Vascular Dorsalis pedis pulses palpable bilaterally. Posterior tibial pulses palpable bilaterally. Capillary refill normal to all digits.  No cyanosis or clubbing noted. Pedal hair growth normal.  Neurologic Normal speech. Oriented to person, place, and time. Protective sensation absent  Dermatologic Wound Location: left submet 1 Wound Base: Granular/Healthy Peri-wound: Calloused Exudate: None: wound tissue dry Wound Measurements: -3x1.5 post-debridement   Orthopedic: No pain to palpation either foot.   Radiographs:  none Assessment:   1. Ulcer of left foot, with fat layer exposed ([place])    Plan:  Patient was evaluated and treated and all questions answered.  Ulcer Left foot -Debridement as below. -Dressed with silvadene, DSD. -Transition back to surgical shoe.  Procedure: Excisional Debridement of Wound Rationale: Removal of non-viable soft tissue from the wound to promote healing.  Anesthesia: none Pre-Debridement Wound Measurements: 1 cm x 2 cm x 0.2 cm  Post-Debridement Wound Measurements: 3 cm x 2 cm x 0.2 cm  Type of Debridement: Sharp Excisional Tissue Removed: Non-viable soft tissue Depth of Debridement: subcutaneous tissue. Technique: Sharp excisional debridement to bleeding, viable wound base.  Dressing: Dry, sterile, compression dressing.  Return in about 2 weeks (around 11/26/2019) for Wound Care, Left.

## 2019-11-26 ENCOUNTER — Other Ambulatory Visit: Payer: Self-pay

## 2019-11-26 ENCOUNTER — Ambulatory Visit (INDEPENDENT_AMBULATORY_CARE_PROVIDER_SITE_OTHER): Payer: Medicare Other | Admitting: Podiatry

## 2019-11-26 DIAGNOSIS — L97521 Non-pressure chronic ulcer of other part of left foot limited to breakdown of skin: Secondary | ICD-10-CM | POA: Diagnosis not present

## 2019-11-26 NOTE — Progress Notes (Signed)
  Subjective:  Patient ID: James Norton., male    DOB: Dec 05, 1940,  MRN: [id number]  Chief Complaint  Patient presents with  . Foot Ulcer    F/U Lt ulcer Pt. states," it's been doidng alright, improving a little bit, opening is getting smaller." -pt denies pain/redness/swellgin/draiange Tx: silvadene, iodosorb and cotton swab   . Diabetes    FBS: 70 A1C: 7.8    79 y.o. male presents for wound care. Hx confirmed with patient. Hx as above. Objective:  Physical Exam: Wound Location: left submet 1 Wound Measurement: 1x2 post-debridement Wound Base: Granular/Healthy Peri-wound: Calloused Exudate: Scant/small amount Serosanguinous exudate wound without warmth, erythema, signs of acute infection     Radiographs:  X-ray of the left foot: not indicated  Assessment:  1. Ulcerated, foot, left, limited to breakdown of skin [facility])    Plan:  Patient was evaluated and treated and all questions answered.  Ulcer left submet 1 -Dressing applied consisting of silvadene, border foam dressing -Offload ulcer with surgical shoe. Discussed importance of wearing surgical shoe. -Wound cleansed and debrided -Order foam border dressing supplies.  Procedure: Excisional Debridement of Wound Rationale: Removal of non-viable soft tissue from the wound to promote healing.  Anesthesia: none Pre-Debridement Wound Measurements: 1 cm x 2 cm x 0.2 cm  Post-Debridement Wound Measurements: 1 cm x 2 cm x 0.2 cm  Type of Debridement: Sharp Excisional Tissue Removed: Non-viable soft tissue Depth of Debridement: subcutaneous tissue. Technique: Sharp excisional debridement to bleeding, viable wound base.  Dressing: Dry, sterile, compression dressing. Disposition: Patient tolerated procedure well. Patient to return in 1 week for follow-up.    Return in about 2 weeks (around 12/10/2019).  MDM

## 2019-12-09 DIAGNOSIS — Z9181 History of falling: Secondary | ICD-10-CM | POA: Diagnosis not present

## 2019-12-09 DIAGNOSIS — Z125 Encounter for screening for malignant neoplasm of prostate: Secondary | ICD-10-CM | POA: Diagnosis not present

## 2019-12-09 DIAGNOSIS — Z0000 Encounter for general adult medical examination without abnormal findings: Secondary | ICD-10-CM | POA: Diagnosis not present

## 2019-12-09 DIAGNOSIS — E785 Hyperlipidemia, unspecified: Secondary | ICD-10-CM | POA: Diagnosis not present

## 2019-12-09 DIAGNOSIS — Z1331 Encounter for screening for depression: Secondary | ICD-10-CM | POA: Diagnosis not present

## 2019-12-10 ENCOUNTER — Ambulatory Visit (INDEPENDENT_AMBULATORY_CARE_PROVIDER_SITE_OTHER): Payer: Medicare Other | Admitting: Podiatry

## 2019-12-10 ENCOUNTER — Other Ambulatory Visit: Payer: Self-pay

## 2019-12-10 DIAGNOSIS — L97521 Non-pressure chronic ulcer of other part of left foot limited to breakdown of skin: Secondary | ICD-10-CM | POA: Diagnosis not present

## 2019-12-10 NOTE — Progress Notes (Signed)
  Subjective:  Patient ID: James Solo., male    DOB: [birth year],  MRN: [id number]  No chief complaint on file.   79 y.o. male presents for wound care. States the wound is doing better, not as callused. Denies new issues. Wearing the surgical shoe. Objective:  Physical Exam: Wound Location: left submet 1 Wound Measurement: 1x2 pre-debridement Wound Base: Granular/Healthy Peri-wound: Calloused Exudate: Moderate amount Serosanguinous exudate wound without warmth, erythema, signs of acute infection and appearance is similar to last recheck    Assessment:   1. Ulcerated, foot, left, limited to breakdown of skin [facility])      Plan:  Patient was evaluated and treated and all questions answered.  Ulcer Left submet 1 -Dressing applied consisting of foam border dressing -Order HHC for assistance with dressing changes. -Offload ulcer with surgical shoe -Wound cleansed and debrided  Procedure: Excisional Debridement of Wound Rationale: Removal of non-viable soft tissue from the wound to promote healing.  Anesthesia: none Pre-Debridement Wound Measurements: 1 cm x 2 cm x 0.2 cm  Post-Debridement Wound Measurements: 1.2 cm x 2.2 cm x 0.2 cm  Type of Debridement: Sharp Excisional Tissue Removed: Non-viable soft tissue Depth of Debridement: subcutaneous tissue. Technique: Sharp excisional debridement to bleeding, viable wound base.  Dressing: Dry, sterile, compression dressing. Disposition: Patient tolerated procedure well. Patient to return in 1 week for follow-up.  Return in about 2 weeks (around 12/24/2019) for Wound Care, Left.

## 2019-12-13 DIAGNOSIS — D519 Vitamin B12 deficiency anemia, unspecified: Secondary | ICD-10-CM | POA: Diagnosis not present

## 2019-12-24 ENCOUNTER — Ambulatory Visit (INDEPENDENT_AMBULATORY_CARE_PROVIDER_SITE_OTHER): Payer: Medicare Other | Admitting: Podiatry

## 2019-12-24 ENCOUNTER — Other Ambulatory Visit: Payer: Self-pay

## 2019-12-24 DIAGNOSIS — L97521 Non-pressure chronic ulcer of other part of left foot limited to breakdown of skin: Secondary | ICD-10-CM | POA: Diagnosis not present

## 2019-12-24 DIAGNOSIS — E1142 Type 2 diabetes mellitus with diabetic polyneuropathy: Secondary | ICD-10-CM

## 2019-12-24 MED ORDER — DOXYCYCLINE HYCLATE 100 MG PO TABS: 100.0000 mg | ORAL_TABLET | Freq: Two times a day (BID) | ORAL | 0 refills | Status: DC

## 2019-12-24 NOTE — Progress Notes (Signed)
I put charges in for the diabetic inserts (3 pr).

## 2019-12-24 NOTE — Progress Notes (Signed)
We cannot put charges in until we dispense. So he picked up shoes and inserts today.I can put charges in.

## 2019-12-24 NOTE — Progress Notes (Signed)
  Subjective:  Patient ID: James Solo., male    DOB: November 14, 1941,  MRN: [id number]  Chief Complaint  Patient presents with  . Foot Ulcer    F/U Lt sub met 1 ulcer Pt. states," it looks better, the callus is smaller." -pt denies draiange and redness -w/ slight swelling (too much on feet yesterday) Tx: foam dressing and sx shoe -pt denies N/V/F?Ch   . shoes    Pt PUDS Pt. states shoes fit very well with the inserts and they feel comfortable     79 y.o. male presents for wound care. Hx confirmed with patient.  Objective:  Physical Exam: Wound Location: left submet 1 Wound Measurement: 1x2 post-debridement Wound Base: Granular/Healthy Peri-wound: Calloused Exudate: Scant/small amount Serosanguinous exudate wound without warmth, erythema, signs of acute infection  Dorsal 1st interspace blanchable erythema.  Assessment:   1. Ulcerated, foot, left, limited to breakdown of skin ([place])   2. DM type 2 with diabetic peripheral neuropathy ([place])    Plan:  Patient was evaluated and treated and all questions answered.  Ulcer left foot -Dressing applied consisting of prisma and border foam -Wound cleansed and debrided. -He does have some dorsal irritation with blanchable erythema and warmth. Will rx Doxycycline and monitor. -Diabetic inserts dispensed. Good fit, patient pleased with the comfort.   Procedure: Excisional Debridement of Wound Rationale: Removal of non-viable soft tissue from the wound to promote healing.  Anesthesia: none Pre-Debridement Wound Measurements: 1 cm x 1.5 cm x 0.2 cm  Post-Debridement Wound Measurements: 1 cm x 2 cm x 0.2 cm  Type of Debridement: Sharp Excisional Tissue Removed: Non-viable soft tissue Depth of Debridement: subcutaneous tissue. Technique: Sharp excisional debridement to bleeding, viable wound base.  Dressing: Dry, sterile, compression dressing. Disposition: Patient tolerated procedure well. Patient to return in 1 week for  follow-up.  Return in about 2 weeks (around 01/07/2020) for Wound Care.

## 2019-12-24 NOTE — Progress Notes (Signed)
Is he diabetic?

## 2020-01-07 ENCOUNTER — Other Ambulatory Visit: Payer: Self-pay

## 2020-01-07 ENCOUNTER — Ambulatory Visit (INDEPENDENT_AMBULATORY_CARE_PROVIDER_SITE_OTHER): Payer: Medicare Other | Admitting: Podiatry

## 2020-01-07 DIAGNOSIS — L909 Atrophic disorder of skin, unspecified: Secondary | ICD-10-CM | POA: Diagnosis not present

## 2020-01-07 DIAGNOSIS — L97521 Non-pressure chronic ulcer of other part of left foot limited to breakdown of skin: Secondary | ICD-10-CM | POA: Diagnosis not present

## 2020-01-07 DIAGNOSIS — M21962 Unspecified acquired deformity of left lower leg: Secondary | ICD-10-CM | POA: Diagnosis not present

## 2020-01-07 NOTE — Progress Notes (Signed)
  Subjective:  Patient ID: James Solo., male    DOB: December 16, 1940,  MRN: [id number]  Chief Complaint  Patient presents with  . Wound Check    F/U Lt wound check Pt. states," it's just sitting there, it looks the same, no improvement." -pt denies pain -w/ slight redness and swellgin with prolong walking -w/ spoty driaange Tx: bandaid and silvadene -pt completed abx   . Diabetes    FBS: 64   79 y.o. male presents for wound care. Hx confirmed with patient.  Thinks that the wound is doing a little bit better with the insert. Objective:  Physical Exam: Wound Location: left submet 1 Wound Measurement: 1.5x2 post-debridement Wound Base: Granular/Healthy Peri-wound: Calloused Exudate: Scant/small amount Serosanguinous exudate wound without warmth, erythema, signs of acute infection  Assessment:   1. Ulcerated, foot, left, limited to breakdown of skin ([place])   2. Fat pad atrophy of foot   3. Metatarsal deformity, left    Plan:  Patient was evaluated and treated and all questions answered.  Ulcer left foot -Discussed with patient that due to continued ulceration without significant progress he would benefit from wound debridement and closure with application of fat allograft in order to promote healing of the wound and offloading.  Patient verbalized understanding like to proceed. -Wound excisionally debrided as below -Consent forms reviewed and signed for the above procedure -Patient has failed all conservative therapy and wishes to proceed with surgical intervention. All risks, benefits, and alternatives discussed with patient. No guarantees given. Consent reviewed and signed by patient. -Planned procedures: Left submet 1 ulcer wound debridement closure injection of fat allograft  Procedure: Excisional Debridement of Wound Rationale: Removal of non-viable soft tissue from the wound to promote healing.  Anesthesia: none Pre-Debridement Wound Measurements: 1 cm x 1.5 cm x 0.2 cm    Post-Debridement Wound Measurements: 1.5 cm x 2 cm x 0.2 cm  Type of Debridement: Sharp Excisional Tissue Removed: Non-viable soft tissue Depth of Debridement: subcutaneous tissue. Technique: Sharp excisional debridement to bleeding, viable wound base.  Dressing: Dry, sterile, compression dressing. Disposition: Patient tolerated procedure well. Patient to return in 1 week for follow-up.  No follow-ups on file.

## 2020-01-07 NOTE — Patient Instructions (Signed)
Pre-Operative Instructions  Congratulations, you have decided to take an important step towards improving your quality of life.  You can be assured that the doctors and staff at [facility] will be with you every step of the way.  Here are some important things you should know:  1. Plan to be at the surgery center/hospital at least 1 (one) hour prior to your scheduled time, unless otherwise directed by the surgical center/hospital staff.  You must have a responsible adult accompany you, remain during the surgery and drive you home.  Make sure you have directions to the surgical center/hospital to ensure you arrive on time. 2. If you are having surgery at [facility] or [place] hospitals, you will need a copy of your medical history and physical form from your family physician within one month prior to the date of surgery. We will give you a form for your primary physician to complete.  3. We make every effort to accommodate the date you request for surgery.  However, there are times where surgery dates or times have to be moved.  We will contact you as soon as possible if a change in schedule is required.   4. No aspirin/ibuprofen for one week before surgery.  If you are on aspirin, any non-steroidal anti-inflammatory medications (Mobic, Aleve, Ibuprofen) should not be taken seven (7) days prior to your surgery.  You make take Tylenol for pain prior to surgery.  5. Medications - If you are taking daily heart and blood pressure medications, seizure, reflux, allergy, asthma, anxiety, pain or diabetes medications, make sure you notify the surgery center/hospital before the day of surgery so they can tell you which medications you should take or avoid the day of surgery. 6. No food or drink after midnight the night before surgery unless directed otherwise by surgical center/hospital staff. 7. No alcoholic beverages 24-hours prior to surgery.  No smoking 24-hours prior or 24-hours after  surgery. 8. Wear loose pants or shorts. They should be loose enough to fit over bandages, boots, and casts. 9. Don't wear slip-on shoes. Sneakers are preferred. 10. Bring your boot with you to the surgery center/hospital.  Also bring crutches or a walker if your physician has prescribed it for you.  If you do not have this equipment, it will be provided for you after surgery. 11. If you have not been contacted by the surgery center/hospital by the day before your surgery, call to confirm the date and time of your surgery. 12. Leave-time from work may vary depending on the type of surgery you have.  Appropriate arrangements should be made prior to surgery with your employer. 13. Prescriptions will be provided immediately following surgery by your doctor.  Fill these as soon as possible after surgery and take the medication as directed. Pain medications will not be refilled on weekends and must be approved by the doctor. 14. Remove nail polish on the operative foot and avoid getting pedicures prior to surgery. 15. Wash the night before surgery.  The night before surgery wash the foot and leg well with water and the antibacterial soap provided. Be sure to pay special attention to beneath the toenails and in between the toes.  Wash for at least three (3) minutes. Rinse thoroughly with water and dry well with a towel.  Perform this wash unless told not to do so by your physician.  Enclosed: 1 Ice pack (please put in freezer the night before surgery)   1 Hibiclens skin cleaner     Pre-op instructions  If you have any questions regarding the instructions, please do not hesitate to call our office.  [place]: [address], [place], [place] [address] -- [phone]  [place]: [address] [place], [place] [address] -- [phone]  [place]: [address], , [place] [address] -- [phone]   Website: [email or web address] 

## 2020-01-13 DIAGNOSIS — D519 Vitamin B12 deficiency anemia, unspecified: Secondary | ICD-10-CM | POA: Diagnosis not present

## 2020-01-16 DIAGNOSIS — Z20828 Contact with and (suspected) exposure to other viral communicable diseases: Secondary | ICD-10-CM | POA: Diagnosis not present

## 2020-01-20 ENCOUNTER — Other Ambulatory Visit: Payer: Self-pay | Admitting: Podiatry

## 2020-01-20 DIAGNOSIS — Z7984 Long term (current) use of oral hypoglycemic drugs: Secondary | ICD-10-CM | POA: Diagnosis not present

## 2020-01-20 DIAGNOSIS — M216X2 Other acquired deformities of left foot: Secondary | ICD-10-CM | POA: Diagnosis not present

## 2020-01-20 DIAGNOSIS — E11621 Type 2 diabetes mellitus with foot ulcer: Secondary | ICD-10-CM | POA: Diagnosis not present

## 2020-01-20 DIAGNOSIS — L97521 Non-pressure chronic ulcer of other part of left foot limited to breakdown of skin: Secondary | ICD-10-CM | POA: Diagnosis not present

## 2020-01-20 DIAGNOSIS — T8131XA Disruption of external operation (surgical) wound, not elsewhere classified, initial encounter: Secondary | ICD-10-CM | POA: Diagnosis not present

## 2020-01-20 DIAGNOSIS — L97528 Non-pressure chronic ulcer of other part of left foot with other specified severity: Secondary | ICD-10-CM | POA: Diagnosis not present

## 2020-01-20 DIAGNOSIS — E65 Localized adiposity: Secondary | ICD-10-CM | POA: Diagnosis not present

## 2020-01-20 DIAGNOSIS — Z79899 Other long term (current) drug therapy: Secondary | ICD-10-CM | POA: Diagnosis not present

## 2020-01-20 MED ORDER — CLINDAMYCIN HCL 150 MG PO CAPS: 150.0000 mg | ORAL_CAPSULE | Freq: Two times a day (BID) | ORAL | 0 refills | Status: DC

## 2020-01-20 NOTE — Progress Notes (Signed)
Rx sent to pharmacy for outpatient surgery. °

## 2020-01-21 ENCOUNTER — Encounter: Payer: Medicare Other | Admitting: Podiatry

## 2020-01-27 ENCOUNTER — Encounter: Payer: Medicare Other | Admitting: Podiatry

## 2020-01-27 ENCOUNTER — Ambulatory Visit (INDEPENDENT_AMBULATORY_CARE_PROVIDER_SITE_OTHER): Payer: Medicare Other | Admitting: Podiatry

## 2020-01-27 ENCOUNTER — Other Ambulatory Visit: Payer: Self-pay

## 2020-01-27 DIAGNOSIS — Z9889 Other specified postprocedural states: Secondary | ICD-10-CM

## 2020-01-27 DIAGNOSIS — L97521 Non-pressure chronic ulcer of other part of left foot limited to breakdown of skin: Secondary | ICD-10-CM

## 2020-01-27 NOTE — Progress Notes (Signed)
  Subjective:  Patient ID: James Solo., male    DOB: [birth year],  MRN: [id number]  No chief complaint on file.   DOS: 01/20/20 Procedure: Intermediate wound closure left, injection of fat allograft   79 y.o. male presents for post-op of the above surgery. History confirmed with patient. Doing well denies pain or post-op isuses  Objective:  Physical Exam: no tenderness at the surgical site, no edema noted and calf supple, nontender. Incision: healing well, no dehiscence, no significant erythema, slight clear drainage present    Assessment:   1. Ulcerated, foot, left, limited to breakdown of skin ([place])   2. Post-operative state     Plan:  Patient was evaluated and treated and all questions answered.  Post-operative State -Dressing applied consisting of betadine, sterile gauze, kerlix and ACE bandage -WBAT in CAM boot  -Do not get wet in the shower.   No follow-ups on file.

## 2020-01-30 ENCOUNTER — Telehealth: Payer: Self-pay

## 2020-01-30 NOTE — Telephone Encounter (Signed)
Can you have him send a picture. Based on the picture from Dr. March Rummage previous there was some white area around it then. Can we see if it has increased? I would pat some betadine on the area. He can also come in to have it evaluated.

## 2020-01-30 NOTE — Telephone Encounter (Signed)
Pt called concerned of a white spot on his sx/wound are. Pt states yesterday when he was applying a new bandage he noticed a white spot that covers half of the wound area. Pt denies redness/swelling/drainage or any maceration. Please advice

## 2020-01-31 ENCOUNTER — Telehealth: Payer: Self-pay

## 2020-01-31 NOTE — Telephone Encounter (Signed)
I wound keep a small amount of betaine on the area daily. If there is any drainage or redness he needs to come in to be seen.

## 2020-01-31 NOTE — Telephone Encounter (Signed)
Pt send a picture of the spot on his wound area

## 2020-01-31 NOTE — Telephone Encounter (Signed)
Called and spoke with pt, per Dr. Jacqualyn Posey pt to apply betadine daily and if any drainage/rednedd/swelling to give [place] a call. Pt stated understanding.

## 2020-01-31 NOTE — Telephone Encounter (Signed)
Called and spoke with pt. I advised the pt if he can send [place] a picture of his foot through my chart, pt states he does not have mychart.  I provided the pt my work email for him to send the picture through there.  Pt was advised to start applying betadine with a dry dressing on his wound per Dr. Jacqualyn Posey.

## 2020-02-04 ENCOUNTER — Ambulatory Visit (INDEPENDENT_AMBULATORY_CARE_PROVIDER_SITE_OTHER): Payer: Medicare Other | Admitting: Podiatry

## 2020-02-04 ENCOUNTER — Other Ambulatory Visit: Payer: Self-pay

## 2020-02-04 DIAGNOSIS — L97521 Non-pressure chronic ulcer of other part of left foot limited to breakdown of skin: Secondary | ICD-10-CM | POA: Diagnosis not present

## 2020-02-04 NOTE — Progress Notes (Signed)
  Subjective:  Patient ID: James Norton., male    DOB: 1941-06-19,  MRN: [id number]  Chief Complaint  Patient presents with  . Routine Post Op    POV #2 Pt. states," doing very good, healing up well." -pt denies N/V/F/CH -pt denies redness/swellging -w/. slight draiange (clear and bloody) Tx: boot, resting, icing and elevation   . Diabetes    FBS: 60    DOS: 01/20/20 Procedure: Intermediate wound closure left, injection of fat allograft   79 y.o. male presents for post-op of the above surgery. History confirmed with patient.    Objective:  Physical Exam: no tenderness at the surgical site, no edema noted and calf supple, nontender. Incision: healing well, no dehiscence, no significant erythema, maceration with loose skin   Underneath the macerated tissue was a viable wound bed without exposure to tendon, capsule, or bone. Measured 3x2  Assessment:   1. Ulcerated, foot, left, limited to breakdown of skin [facility])     Plan:  Patient was evaluated and treated and all questions answered.  Post-operative State -The wound was debrided, the wound base was healthy and viable. It would benefit from accelerated granulation to promote healing especially given recent graft application. -SNAP VAC applied. Patient to remove Friday.  Procedure: Excisional Debridement of Wound Rationale: Removal of non-viable soft tissue from the wound to promote healing.  Anesthesia: none Pre-Debridement Wound Measurements: coapted wound with redundant skin Post-Debridement Wound Measurements: 3 cm x 2 cm x 0.2 cm  Type of Debridement: Sharp Excisional Tissue Removed: Non-viable soft tissue Depth of Debridement: subcutaneous tissue. Technique: Sharp excisional debridement to bleeding, viable wound base.  Dressing: Dry, sterile, compression dressing. Disposition: Patient tolerated procedure well. Patient to return in 1 week for follow-up.  Procedure: Mechanical Wound VAC Application Location: submet  1 left Wound Measurement: 3 cm x 2 cm x 0.2 cm  Technique: Blue foam to wound base, followed by hydrocolloid 0-Ring, followed by hydrocolloid dressing. Plunger maximally depressed with with good seal noted. Disposition: Patient tolerated procedure well.

## 2020-02-07 DIAGNOSIS — E1142 Type 2 diabetes mellitus with diabetic polyneuropathy: Secondary | ICD-10-CM | POA: Diagnosis not present

## 2020-02-07 DIAGNOSIS — E785 Hyperlipidemia, unspecified: Secondary | ICD-10-CM | POA: Diagnosis not present

## 2020-02-07 DIAGNOSIS — D519 Vitamin B12 deficiency anemia, unspecified: Secondary | ICD-10-CM | POA: Diagnosis not present

## 2020-02-07 DIAGNOSIS — D509 Iron deficiency anemia, unspecified: Secondary | ICD-10-CM | POA: Diagnosis not present

## 2020-02-10 ENCOUNTER — Encounter: Payer: Medicare Other | Admitting: Podiatry

## 2020-02-10 DIAGNOSIS — E1165 Type 2 diabetes mellitus with hyperglycemia: Secondary | ICD-10-CM | POA: Diagnosis not present

## 2020-02-10 DIAGNOSIS — E785 Hyperlipidemia, unspecified: Secondary | ICD-10-CM | POA: Diagnosis not present

## 2020-02-10 DIAGNOSIS — I10 Essential (primary) hypertension: Secondary | ICD-10-CM | POA: Diagnosis not present

## 2020-02-10 DIAGNOSIS — E1142 Type 2 diabetes mellitus with diabetic polyneuropathy: Secondary | ICD-10-CM | POA: Diagnosis not present

## 2020-02-11 ENCOUNTER — Ambulatory Visit (INDEPENDENT_AMBULATORY_CARE_PROVIDER_SITE_OTHER): Payer: Medicare Other | Admitting: Podiatry

## 2020-02-11 ENCOUNTER — Other Ambulatory Visit: Payer: Self-pay

## 2020-02-11 DIAGNOSIS — L97521 Non-pressure chronic ulcer of other part of left foot limited to breakdown of skin: Secondary | ICD-10-CM | POA: Diagnosis not present

## 2020-02-11 NOTE — Progress Notes (Signed)
  Subjective:  Patient ID: James Solo., male    DOB: 09-21-1941,  MRN: [id number]  Chief Complaint  Patient presents with  . Wound Check    POV/wound check pt. stated." it starte dlooking a lot better." pt deneis redness/swellgin -w/ little drianage Tx: boot, betadine and dry dressing   . Diabetes    FBS: 67    DOS: 01/20/20 Procedure: Intermediate wound closure left, injection of fat allograft   79 y.o. male presents for post-op of the above surgery. History confirmed with patient.    Objective:  Wound measuring 2x1 left 1st MPJ with fully granular base surrounding hyperkeratosis no warmth erythema signs of acute infection.  Assessment:   1. Ulcerated, foot, left, limited to breakdown of skin [facility])     Plan:  Patient was evaluated and treated and all questions answered.  Post-operative State -The wound was again debrided. Improving. Dressed with silvadene and mepilex border dressing  Procedure: Selective Debridement of Wound Rationale: Removal of devitalized tissue from the wound to promote healing.  Pre-Debridement Wound Measurements: 2 cm x 1 cm x 0.1 cm  Post-Debridement Wound Measurements: same as pre-debridement. Type of Debridement: sharp selective Tissue Removed: Devitalized soft-tissue Dressing: Dry, sterile, compression dressing. Disposition: Patient tolerated procedure well. Patient to return in 1 week for follow-up.

## 2020-02-13 DIAGNOSIS — D519 Vitamin B12 deficiency anemia, unspecified: Secondary | ICD-10-CM | POA: Diagnosis not present

## 2020-02-17 ENCOUNTER — Ambulatory Visit (INDEPENDENT_AMBULATORY_CARE_PROVIDER_SITE_OTHER): Payer: Medicare Other | Admitting: Podiatry

## 2020-02-17 ENCOUNTER — Other Ambulatory Visit: Payer: Self-pay

## 2020-02-17 DIAGNOSIS — L97522 Non-pressure chronic ulcer of other part of left foot with fat layer exposed: Secondary | ICD-10-CM

## 2020-02-17 NOTE — Progress Notes (Signed)
  Subjective:  Patient ID: James Solo., male    DOB: 08/08/1941,  MRN: [id number]  Chief Complaint  Patient presents with  . Wound Check    F/U Lt wound check pt. states," feelin better, the wound feels good/better." -pt denies N/V/F?Ch -pt removed wound vac yesterday since it was leaking Tx: crutches, abx and elevation   . Diabetes    FBS: 120    79 y.o. male presents for wound care. Hx confirmed with patient.  Objective:  Physical Exam: Wound Location: left 1st MPJ Wound Measurement: 1x1 Wound Base: Granular/Healthy Peri-wound: Normal Exudate: Scant/small amount Serosanguinous exudate wound without warmth, erythema, signs of acute infection  Assessment:   1. Ulcer of left foot, with fat layer exposed ([place])    Plan:  Patient was evaluated and treated and all questions answered.  Ulcer left 1st MPJ -Offload ulcer with CAM boot -Wound cleansed and minimally debrided  Procedure: Mechanical Wound VAC Application Location: left 1st MPJ Wound Measurement: 1 cm x 1 cm x 0.2 cm  Technique: Blue foam to wound base, followed by hydrocolloid 0-Ring, followed by hydrocolloid dressing. Plunger maximally depressed with with good seal noted. Disposition: Patient tolerated procedure well.  Return in about 1 week (around 02/24/2020).  MDM

## 2020-02-24 ENCOUNTER — Encounter: Payer: Medicare Other | Admitting: Podiatry

## 2020-02-25 ENCOUNTER — Ambulatory Visit (INDEPENDENT_AMBULATORY_CARE_PROVIDER_SITE_OTHER): Payer: Medicare Other | Admitting: Podiatry

## 2020-02-25 ENCOUNTER — Other Ambulatory Visit: Payer: Self-pay

## 2020-02-25 DIAGNOSIS — L97522 Non-pressure chronic ulcer of other part of left foot with fat layer exposed: Secondary | ICD-10-CM

## 2020-02-26 ENCOUNTER — Ambulatory Visit: Payer: Medicare Other | Admitting: Orthotics

## 2020-02-26 DIAGNOSIS — E1142 Type 2 diabetes mellitus with diabetic polyneuropathy: Secondary | ICD-10-CM

## 2020-02-26 DIAGNOSIS — L97521 Non-pressure chronic ulcer of other part of left foot limited to breakdown of skin: Secondary | ICD-10-CM

## 2020-02-26 DIAGNOSIS — L97522 Non-pressure chronic ulcer of other part of left foot with fat layer exposed: Secondary | ICD-10-CM

## 2020-02-26 NOTE — Progress Notes (Signed)

## 2020-03-16 DIAGNOSIS — D519 Vitamin B12 deficiency anemia, unspecified: Secondary | ICD-10-CM | POA: Diagnosis not present

## 2020-03-24 ENCOUNTER — Ambulatory Visit (INDEPENDENT_AMBULATORY_CARE_PROVIDER_SITE_OTHER): Payer: Medicare Other | Admitting: Podiatry

## 2020-03-24 ENCOUNTER — Other Ambulatory Visit: Payer: Self-pay

## 2020-03-24 DIAGNOSIS — L97522 Non-pressure chronic ulcer of other part of left foot with fat layer exposed: Secondary | ICD-10-CM

## 2020-03-24 DIAGNOSIS — L928 Other granulomatous disorders of the skin and subcutaneous tissue: Secondary | ICD-10-CM

## 2020-04-07 ENCOUNTER — Ambulatory Visit (INDEPENDENT_AMBULATORY_CARE_PROVIDER_SITE_OTHER): Payer: Medicare Other | Admitting: Podiatry

## 2020-04-07 ENCOUNTER — Other Ambulatory Visit: Payer: Self-pay

## 2020-04-07 DIAGNOSIS — L97522 Non-pressure chronic ulcer of other part of left foot with fat layer exposed: Secondary | ICD-10-CM | POA: Diagnosis not present

## 2020-04-07 NOTE — Progress Notes (Signed)
  Subjective:  Patient ID: James Solo., male    DOB: [birth year],  MRN: [id number]  No chief complaint on file.  79 y.o. male presents for wound care. Hx confirmed with patient. Thinks the wound is doing better slowly healing with less callus. Here for DM shoe pickup as well. Denies redness, swelling, drainage. Objective:  Physical Exam: Wound Location: left 1st MPJ Wound Measurement: 1.5x1.5 Wound Base: Granular/Healthy Peri-wound: Calloused Exudate: None: wound tissue dry wound without warmth, erythema, signs of acute infection Assessment:   1. Ulcer of left foot, with fat layer exposed ([place])      Plan:  Patient was evaluated and treated and all questions answered.  Ulcer left foot -Debrided and cauterized with silver nitratre -Dispense DM shoes  Procedure: Chemical Cauterization of Granulation Tissue Rationale: Cauterize granular wound base to promote healing.  Wound Measurements: 1.5 cm x 1.5 cm x flush to skin   Instrumentation: Silver nitrate stick x2 Dressing: Dry, sterile, compression dressing. Disposition: Patient tolerated procedure well. Patient to return in 1 week for follow-up.   Return in about 3 weeks (around 04/28/2020) for Wound Care.

## 2020-04-07 NOTE — Progress Notes (Signed)
  Subjective:  Patient ID: James Solo., male    DOB: 22-Jan-1941,  MRN: [id number]  Chief Complaint  Patient presents with  . Routine Post Op    POV Pt. states,' it's doing alright, but too much callus buildup." -pt dnies pain/redness/swelling/drainage/N/V/F/Ch Tx: boot, silvadene and light dressing     79 y.o. male presents for wound care. Hx confirmed with patient.  Objective:  Physical Exam: Wound Location: left 1st MPJ Wound Measurement: 1x1 Wound Base: Granular/Healthy Peri-wound: hyperkeratotic. Exudate: Scant/small amount Serosanguinous exudate wound without warmth, erythema, signs of acute infection  Assessment:   1. Ulcer of left foot, with fat layer exposed ([place])   2. Other granulomatous disorders of the skin and subcutaneous tissue    Plan:  Patient was evaluated and treated and all questions answered.  Ulcer left 1st MPJ -Wound fully granular. Debrided and central part cauterized with silver nitrate.  Procedure: Chemical Cauterization of Granulation Tissue Rationale: Cauterize granular wound base to promote healing.  Wound Measurements: 1 cm x 1 cm x 0.2 cm  Instrumentation: Silver nitrate stick x2 Dressing: Dry, sterile, compression dressing. Disposition: Patient tolerated procedure well. Patient to return in 1 week for follow-up.     No follow-ups on file.

## 2020-04-07 NOTE — Progress Notes (Signed)
  Subjective:  Patient ID: James Norton., male    DOB: [birth year],  MRN: [id number]  Chief Complaint  Patient presents with  . Wound Check    F?U L twound check pt. states," been doing alright, getting smaller." Tx: boot, silvadene   . Diabetes    FBS: 63 a1C: >8    79 y.o. male presents for wound care. Hx confirmed with patient.  Objective:  Physical Exam: Wound Location: left 1st MPJ Wound Measurement: 1x1 Wound Base: Granular/Healthy Peri-wound: Normal Exudate: Scant/small amount Serosanguinous exudate wound without warmth, erythema, signs of acute infection  Assessment:   1. Ulcer of left foot, with fat layer exposed ([place])    Plan:  Patient was evaluated and treated and all questions answered.  Ulcer left 1st MPJ -Offload ulcer with CAM boot -Wound cleansed and debrided  Procedure: Selective Debridement of Wound Rationale: Removal of devitalized tissue from the wound to promote healing.  Pre-Debridement Wound Measurements: 1 cm x 1 cm x 0.2 cm  Post-Debridement Wound Measurements: same as pre-debridement. Type of Debridement: sharp selective Tissue Removed: Devitalized soft-tissue Dressing: Dry, sterile, compression dressing. Disposition: Patient tolerated procedure well. Patient to return in 1 week for follow-up.    No follow-ups on file.  MDM

## 2020-04-16 DIAGNOSIS — D519 Vitamin B12 deficiency anemia, unspecified: Secondary | ICD-10-CM | POA: Diagnosis not present

## 2020-04-28 ENCOUNTER — Encounter: Payer: Self-pay | Admitting: Podiatry

## 2020-04-28 ENCOUNTER — Other Ambulatory Visit: Payer: Self-pay

## 2020-04-28 ENCOUNTER — Ambulatory Visit (INDEPENDENT_AMBULATORY_CARE_PROVIDER_SITE_OTHER): Payer: Medicare Other | Admitting: Podiatry

## 2020-04-28 DIAGNOSIS — L97522 Non-pressure chronic ulcer of other part of left foot with fat layer exposed: Secondary | ICD-10-CM | POA: Diagnosis not present

## 2020-04-28 NOTE — Progress Notes (Signed)
  Subjective:  Patient ID: James Norton., male    DOB: 03/13/1941,  MRN: [id number]  Chief Complaint  Patient presents with  . Foot Ulcer    [facility] wound care f/u- pt states he is doing well, denies any pain. no f/c/n/v/sob/cp- slowly healing   79 y.o. male presents for wound care. Hx confirmed with patient. Thinks the wound is doing better but having a lot of callus. Objective:  Physical Exam: Wound Location: left 1st MPJ Wound Measurement: 2x1.5 Wound Base: Granular/Healthy Peri-wound: Calloused Exudate: None: wound tissue dry wound without warmth, erythema, signs of acute infection Assessment:   1. Ulcer of left foot, with fat layer exposed ([place])    Plan:  Patient was evaluated and treated and all questions answered.  Ulcer left foot -Debrided, skin graft substitute of Artacent Tides graft 2x2cm placed to the wound, followed by adaptic, 4x4, kling, tape. -F/u in 1 week for recheck, possible reaplication. -Continue offloading in DM insert.  No follow-ups on file.

## 2020-05-05 ENCOUNTER — Ambulatory Visit (INDEPENDENT_AMBULATORY_CARE_PROVIDER_SITE_OTHER): Payer: Medicare Other | Admitting: Podiatry

## 2020-05-05 ENCOUNTER — Other Ambulatory Visit: Payer: Self-pay

## 2020-05-05 DIAGNOSIS — L97522 Non-pressure chronic ulcer of other part of left foot with fat layer exposed: Secondary | ICD-10-CM | POA: Diagnosis not present

## 2020-05-05 NOTE — Progress Notes (Signed)
  Subjective:  Patient ID: James Solo., male    DOB: 09-Sep-1941,  MRN: [id number]  Chief Complaint  Patient presents with  . Wound Check    F/U Rt wound check pt.s tates," only time I have pain is the nuropathy pain, not often, than I take IBU and it works." - tx: none   . Diabetes    FBS: 70   79 y.o. male presents for wound care. Hx confirmed with patient. Has not looked at the wound given the graft application.  Objective:  Physical Exam: Wound Location: left 1st MPJ Wound Measurement: 1x1.5 Wound Base: Granular/Healthy Peri-wound: Calloused Exudate: None: wound tissue dry wound without warmth, erythema, signs of acute infection Assessment:   1. Ulcer of left foot, with fat layer exposed ([place])    Plan:  Patient was evaluated and treated and all questions answered.  Ulcer left foot -Wound improved. Again gently debrided. Artacent Tides 2x2 cm graft applied to the wound Tissue ID#[id number] Exp 12/09/24. Adaptic, gauze ,actisorb applied to the wound. -F/u in 1 week for recheck, possible reaplication. -Continue offloading in DM insert.  No follow-ups on file.

## 2020-05-11 ENCOUNTER — Other Ambulatory Visit: Payer: Self-pay

## 2020-05-11 ENCOUNTER — Ambulatory Visit (INDEPENDENT_AMBULATORY_CARE_PROVIDER_SITE_OTHER): Payer: Medicare Other | Admitting: Podiatry

## 2020-05-11 DIAGNOSIS — L97422 Non-pressure chronic ulcer of left heel and midfoot with fat layer exposed: Secondary | ICD-10-CM

## 2020-05-11 DIAGNOSIS — E10621 Type 1 diabetes mellitus with foot ulcer: Secondary | ICD-10-CM | POA: Diagnosis not present

## 2020-05-11 NOTE — Progress Notes (Signed)
°  Subjective:  Patient ID: Thereasa Solo., male    DOB: 12-13-1940,  MRN: [id number]  Chief Complaint  Patient presents with   Wound Check    F/U Lt wound check pt. states," it keeps getting smaller." -pt denis pain/issues -dressing dry clean and intact Tx: none    79 y.o. male presents for wound care. Hx confirmed with patient. Has not looked at the wound given the graft application.  Objective:  Physical Exam: Wound Location: left 1st MPJ Wound Measurement: 1x1 Wound Base: Granular/Healthy Peri-wound: Calloused Exudate: None: wound tissue dry wound without warmth, erythema, signs of acute infection Assessment:   1. Diabetic ulcer of left midfoot associated with type 1 diabetes mellitus, with fat layer exposed ([place])    Plan:  Patient was evaluated and treated and all questions answered.  Ulcer left foot -Wound continues to slowly improve with graft application -Wound debrided with 312 blade -2x2 Artacent graft reapplied. Tissue ID [id number]. Covered with adaptic, actisorb, 4x4, kerlix, ACE -F/u in 1 week for repeat application  No follow-ups on file.

## 2020-05-19 ENCOUNTER — Encounter: Payer: Self-pay | Admitting: Podiatry

## 2020-05-19 ENCOUNTER — Other Ambulatory Visit: Payer: Self-pay

## 2020-05-19 ENCOUNTER — Ambulatory Visit (INDEPENDENT_AMBULATORY_CARE_PROVIDER_SITE_OTHER): Payer: Medicare Other | Admitting: Podiatry

## 2020-05-19 DIAGNOSIS — L97422 Non-pressure chronic ulcer of left heel and midfoot with fat layer exposed: Secondary | ICD-10-CM

## 2020-05-19 DIAGNOSIS — E10621 Type 1 diabetes mellitus with foot ulcer: Secondary | ICD-10-CM

## 2020-05-19 NOTE — Progress Notes (Signed)
  Subjective:  Patient ID: James Norton., male    DOB: 1941-06-06,  MRN: [id number]  Chief Complaint  Patient presents with  . Foot Ulcer    i am doing better on the left foot and looks better than it did   79 y.o. male presents for wound care. Hx confirmed with patient.  Thinks that the wound is doing better looking smaller  Objective:  Physical Exam: Wound Location: left 1st MPJ Wound Measurement: 0.8x0.8 Wound Base: Granular/Healthy Peri-wound: Calloused Exudate: None: wound tissue dry wound without warmth, erythema, signs of acute infection Assessment:   1. Diabetic ulcer of left midfoot associated with type 1 diabetes mellitus, with fat layer exposed ([place])    Plan:  Patient was evaluated and treated and all questions answered.  Ulcer left foot -Wound continues to improve slowly - Wound wound gently debrided with 312 blade-2 x 2 Artacent graft reapplied.  Covered with Adaptic, Adacel, 4 x 4 Kerlix and Ace bandage -Patient advised to switch back to the cam boot for better offloading -Follow-up in 1 week for possible repeat application  No follow-ups on file.

## 2020-05-26 ENCOUNTER — Ambulatory Visit (INDEPENDENT_AMBULATORY_CARE_PROVIDER_SITE_OTHER): Payer: Medicare Other | Admitting: Podiatry

## 2020-05-26 ENCOUNTER — Other Ambulatory Visit: Payer: Self-pay

## 2020-05-26 DIAGNOSIS — E10621 Type 1 diabetes mellitus with foot ulcer: Secondary | ICD-10-CM

## 2020-05-26 DIAGNOSIS — L97422 Non-pressure chronic ulcer of left heel and midfoot with fat layer exposed: Secondary | ICD-10-CM

## 2020-05-26 NOTE — Progress Notes (Signed)
Pt came in to his 8:15 AM appt. Pt was notified his Dr. Is out of the office today and pt was scheduled for an appt next week.  Per Dr. March Rummage I took/removed the whole dressing off and changed it to a new one. I cleaned it up with wound cleanser, covered it up with guaze, 4x4 kerlix and ace wrap bandage. Per Dr. March Rummage pt was informed he can shower now.  Pt stated at this visit he does not have any issues or concerns, that everything seemed alright, that the wound is getting smaller. Pt denied N/V/F/CH

## 2020-05-27 NOTE — Progress Notes (Signed)
I was not in the office but spoke to the MA regarding the care of this patient. Agree with POC. Will see patient in the office next week.

## 2020-06-02 ENCOUNTER — Ambulatory Visit (INDEPENDENT_AMBULATORY_CARE_PROVIDER_SITE_OTHER): Payer: Medicare Other | Admitting: Podiatry

## 2020-06-02 ENCOUNTER — Other Ambulatory Visit: Payer: Self-pay

## 2020-06-02 DIAGNOSIS — L97422 Non-pressure chronic ulcer of left heel and midfoot with fat layer exposed: Secondary | ICD-10-CM | POA: Diagnosis not present

## 2020-06-02 DIAGNOSIS — E10621 Type 1 diabetes mellitus with foot ulcer: Secondary | ICD-10-CM

## 2020-06-02 NOTE — Progress Notes (Signed)
  Subjective:  Patient ID: James Solo., male    DOB: July 17, 1941,  MRN: [id number]  Chief Complaint  Patient presents with  . Wound Check    F?U Lt ulcer pt stats," I think it's smaller." -pt denis pain/redness/sweling -w/ less draiange or none Tx: dry dressing    79 y.o. male presents for wound care. Hx confirmed with patient.   Objective:  Physical Exam: Wound Location: left 1st MPJ Wound Measurement: 0.6x1 debridement, 1x1.5 post-debridement Wound Base: Granular/Healthy Peri-wound: Calloused Exudate: None: wound tissue dry wound without warmth, erythema, signs of acute infection Assessment:   1. Diabetic ulcer of left midfoot associated with type 1 diabetes mellitus, with fat layer exposed ([place])    Plan:  Patient was evaluated and treated and all questions answered.  Ulcer left foot -Wound about the same as 2 weeks ago, I think it is overall improving greatly. -Wound debrided as below -15 mm Artacent graft #[id number] disk applied -Dressed with adaptic, actisorb, 4x4, kerlix, ACE bandage  Procedure: Excisional Debridement of Wound Indication: Removal of non-viable soft tissue from the wound to promote healing.  Anesthesia: none Pre-Debridement Wound Measurements: 0.6 cm x 1 cm x 0.2 cm  Post-Debridement Wound Measurements: 1 cm x 1.5 cm x 0.2 cm  Type of Debridement: Sharp Excisional Tissue Removed: Non-viable soft tissue Instrumentation: 15 blade and tissue nipper Depth of Debridement: subcutaneous tissue. Technique: Sharp excisional debridement to bleeding, viable wound base.  Dressing: Dry, sterile, compression dressing. Disposition: Patient tolerated procedure well. Patient to return in 1 week for follow-up.  No follow-ups on file.

## 2020-06-09 ENCOUNTER — Ambulatory Visit (INDEPENDENT_AMBULATORY_CARE_PROVIDER_SITE_OTHER): Payer: Medicare Other

## 2020-06-09 ENCOUNTER — Other Ambulatory Visit: Payer: Self-pay

## 2020-06-09 ENCOUNTER — Ambulatory Visit (INDEPENDENT_AMBULATORY_CARE_PROVIDER_SITE_OTHER): Payer: Medicare Other | Admitting: Podiatry

## 2020-06-09 DIAGNOSIS — L97422 Non-pressure chronic ulcer of left heel and midfoot with fat layer exposed: Secondary | ICD-10-CM | POA: Diagnosis not present

## 2020-06-09 DIAGNOSIS — E10621 Type 1 diabetes mellitus with foot ulcer: Secondary | ICD-10-CM

## 2020-06-09 NOTE — Progress Notes (Signed)
  Subjective:  Patient ID: James Solo., male    DOB: 08/04/1941,  MRN: [id number]  Chief Complaint  Patient presents with  . Wound Check    F/U Lt wound check pt. sttes," seems to be helaing alright." -pt denies N/V/F/Ch - pt only changed outer dressing tx: cam boot   79 y.o. male presents for wound care. Hx confirmed with patient. Wearing his CAM boot  Objective:  Physical Exam: Wound Location: left 1st MPJ Wound Measurement: 1x1x1 post-debridement Wound Base: Granular/Healthy Peri-wound: Calloused Exudate: None: wound tissue dry wound without warmth, erythema, signs of acute infection Circumferential undermining, probes to capsule. Probes medially 1.5 cm Assessment:   1. Diabetic ulcer of left midfoot associated with type 1 diabetes mellitus, with fat layer exposed ([place])    Plan:  Patient was evaluated and treated and all questions answered.  Ulcer left foot -Wound without infection today but circumferentially undermines -XR taken, no clear erosions noted. -Discussed he may need sesamoidectomy in the future for healing of the ulcer should it fail to progress in a month or so. -SNAP VAC applied given undermining. Hold off further graft application until no longer undermined. Pt to remove VAC Friday.  Procedure: Excisional Debridement of Wound Indication: Removal of non-viable soft tissue from the wound to promote healing.  Anesthesia: none Pre-Debridement Wound Measurements: 0.5 cm x 0.7 cm x 1 cm  Post-Debridement Wound Measurements: 1 cm x 1 cm x 1 cm  Type of Debridement: Sharp Excisional Tissue Removed: Non-viable soft tissue Instrumentation: 15 blade and tissue nipper Depth of Debridement: subcutaneous tissue. Technique: Sharp excisional debridement to bleeding, viable wound base.  Dressing: Dry, sterile, compression dressing. Disposition: Patient tolerated procedure well. Patient to return in 1 week for follow-up.     Procedure: Mechanical Wound VAC  Application Location: left submet 1 Wound Measurement: 1 cm x 1 cm x 1 cm Technique: Blue foam to wound base, followed by hydrocolloid 0-Ring, followed by hydrocolloid dressing. Plunger maximally depressed with with good seal noted. Disposition: Patient tolerated procedure well.   Return in about 1 week (around 06/16/2020) for Wound Care, Left.

## 2020-06-11 ENCOUNTER — Other Ambulatory Visit: Payer: Self-pay | Admitting: Sports Medicine

## 2020-06-11 ENCOUNTER — Ambulatory Visit (INDEPENDENT_AMBULATORY_CARE_PROVIDER_SITE_OTHER): Payer: Medicare Other

## 2020-06-11 ENCOUNTER — Ambulatory Visit (INDEPENDENT_AMBULATORY_CARE_PROVIDER_SITE_OTHER): Payer: Medicare Other | Admitting: Sports Medicine

## 2020-06-11 ENCOUNTER — Other Ambulatory Visit: Payer: Self-pay

## 2020-06-11 ENCOUNTER — Encounter: Payer: Self-pay | Admitting: Sports Medicine

## 2020-06-11 DIAGNOSIS — E1142 Type 2 diabetes mellitus with diabetic polyneuropathy: Secondary | ICD-10-CM | POA: Diagnosis not present

## 2020-06-11 DIAGNOSIS — L97422 Non-pressure chronic ulcer of left heel and midfoot with fat layer exposed: Secondary | ICD-10-CM | POA: Diagnosis not present

## 2020-06-11 DIAGNOSIS — L928 Other granulomatous disorders of the skin and subcutaneous tissue: Secondary | ICD-10-CM | POA: Diagnosis not present

## 2020-06-11 DIAGNOSIS — L02612 Cutaneous abscess of left foot: Secondary | ICD-10-CM

## 2020-06-11 DIAGNOSIS — M79672 Pain in left foot: Secondary | ICD-10-CM

## 2020-06-11 DIAGNOSIS — E10621 Type 1 diabetes mellitus with foot ulcer: Secondary | ICD-10-CM

## 2020-06-11 DIAGNOSIS — L03032 Cellulitis of left toe: Secondary | ICD-10-CM

## 2020-06-11 MED ORDER — SULFAMETHOXAZOLE-TRIMETHOPRIM 800-160 MG PO TABS
1.0000 | ORAL_TABLET | Freq: Two times a day (BID) | ORAL | 0 refills | Status: DC
Start: 2020-06-11 — End: 2020-08-20

## 2020-06-11 NOTE — Progress Notes (Signed)
Subjective: James Norton. is a 79 y.o. male patient seen in office for evaluation of ulceration of the left foot. Patient has a history of diabetes and a blood glucose level today of 65 mg/dl.   Patient reports that the vac has shifted and the foot is more red and swollen with increased drainage. Denies nausea/fever/vomiting/chills/night sweats/shortness of breath. Patient has no other pedal complaints at this time.  There are no problems to display for this patient.  Current Outpatient Medications on File Prior to Visit  Medication Sig Dispense Refill  . amLODipine (NORVASC) 5 MG tablet     . atorvastatin (LIPITOR) 80 MG tablet     . clindamycin (CLEOCIN) 150 MG capsule Take 1 capsule (150 mg total) by mouth 2 (two) times daily. 14 capsule 0  . doxycycline (VIBRA-TABS) 100 MG tablet Take 1 tablet (100 mg total) by mouth 2 (two) times daily. 14 tablet 0  . LEVEMIR FLEXTOUCH 100 UNIT/ML Pen     . lisinopril (ZESTRIL) 10 MG tablet     . lisinopril (ZESTRIL) 20 MG tablet     . metFORMIN (GLUCOPHAGE-XR) 500 MG 24 hr tablet     . silver sulfADIAZINE (SILVADENE) 1 % cream Apply pea-sized amount to wound daily. 50 g 0  . sulfamethoxazole-trimethoprim (BACTRIM) 400-80 MG tablet Take 1 tablet by mouth 2 (two) times daily. 28 tablet 0   No current facility-administered medications on file prior to visit.   No Known Allergies  No results found for this or any previous visit (from the past 2160 hour(s)).  Objective: There were no vitals filed for this visit.  General: Patient is awake, alert, oriented x 3 and in no acute distress.  Dermatology: Skin is warm and dry bilateral with a full thickness ulceration present sub met 1 on left. Ulceration measures 1 cm x 1 cm x 0.5 cm with a proximal tunnel 2cm deep in the 6 o'clock direction of the wound. There is a macerated border with a fatty tissue in the base. The ulceration probes close to bone. There is no malodor, clear active drainage, focal  erythema, focal edema with warmth at 1st MTPJ dorsal and plantar aspect.  Vascular: Dorsalis Pedis pulse = 1/4 Bilateral,  Posterior Tibial pulse = 1/4 Bilateral,  Capillary Fill Time < 5 seconds  Neurologic: Protective sensation diminished bilateral.   Musculosketal: There is  minimal pain to palpation of left foot at 1st MTPJ on left, + digital deformity, wide appearance to left foot consistent with charcot  Xrays, Left foot: Ulcer defect. No obvious bony destruction suggestive of osteomyelitis at 1st MTPJ, midfoot collapse and ossifications suggestive of chronic charcot deformity. + soft tissue swelling.   No results for input(s): GRAMSTAIN, LABORGA in the last 8760 hours.  Assessment and Plan:  Problem List Items Addressed This Visit    None    Visit Diagnoses    Diabetic ulcer of left midfoot associated with type 1 diabetes mellitus, with fat layer exposed ([place])    -  Primary   Relevant Orders   WOUND CULTURE   Other granulomatous disorders of the skin and subcutaneous tissue       Relevant Orders   WOUND CULTURE   DM type 2 with diabetic peripheral neuropathy ([place])       Relevant Orders   WOUND CULTURE   Cellulitis and abscess of toe of left foot          -Examined patient and discussed the progression of the wound and  treatment alternatives. -Xrays reviewed - Excisionally dedbrided ulceration at left foot to healthy bleeding borders removing nonviable tissue using a sterile chisel blade. Wound measures post debridement as above. Wound was debrided to the level of thee fatty layer with viable wound base exposed to promote healing. Hemostasis was achieved with manuel pressure. Patient tolerated procedure well without any discomfort or anesthesia necessary for this wound debridement.  -Wound culture obtained and started patient on Bactrim antibiotic until final results are available -D/C snap vac for now because of the increase in drainage and localized cellulitis -Applied  packing and dry sterile dressing and instructed patient to continue with daily dressings at home consisting of the same daily  - Advised patient to go to the ER or return to office if the wound worsens or if constitutional symptoms are present. -Patient to return to office next week with Dr. March Rummage for follow up care  or sooner if problems arise.  Landis Martins, DPM

## 2020-06-15 ENCOUNTER — Other Ambulatory Visit: Payer: Self-pay | Admitting: Sports Medicine

## 2020-06-15 ENCOUNTER — Other Ambulatory Visit: Payer: Self-pay

## 2020-06-15 ENCOUNTER — Ambulatory Visit (INDEPENDENT_AMBULATORY_CARE_PROVIDER_SITE_OTHER): Payer: Medicare Other | Admitting: Podiatry

## 2020-06-15 ENCOUNTER — Encounter: Payer: Self-pay | Admitting: Podiatry

## 2020-06-15 DIAGNOSIS — I10 Essential (primary) hypertension: Secondary | ICD-10-CM | POA: Diagnosis not present

## 2020-06-15 DIAGNOSIS — E10621 Type 1 diabetes mellitus with foot ulcer: Secondary | ICD-10-CM

## 2020-06-15 DIAGNOSIS — S91102A Unspecified open wound of left great toe without damage to nail, initial encounter: Secondary | ICD-10-CM | POA: Diagnosis not present

## 2020-06-15 DIAGNOSIS — L03116 Cellulitis of left lower limb: Secondary | ICD-10-CM | POA: Diagnosis not present

## 2020-06-15 DIAGNOSIS — L02612 Cutaneous abscess of left foot: Secondary | ICD-10-CM

## 2020-06-15 DIAGNOSIS — L97529 Non-pressure chronic ulcer of other part of left foot with unspecified severity: Secondary | ICD-10-CM | POA: Diagnosis not present

## 2020-06-15 DIAGNOSIS — L03032 Cellulitis of left toe: Secondary | ICD-10-CM

## 2020-06-15 DIAGNOSIS — E11621 Type 2 diabetes mellitus with foot ulcer: Secondary | ICD-10-CM | POA: Diagnosis not present

## 2020-06-15 DIAGNOSIS — E1142 Type 2 diabetes mellitus with diabetic polyneuropathy: Secondary | ICD-10-CM | POA: Diagnosis not present

## 2020-06-15 DIAGNOSIS — E11610 Type 2 diabetes mellitus with diabetic neuropathic arthropathy: Secondary | ICD-10-CM | POA: Diagnosis not present

## 2020-06-15 DIAGNOSIS — Z7982 Long term (current) use of aspirin: Secondary | ICD-10-CM | POA: Diagnosis not present

## 2020-06-15 DIAGNOSIS — L97528 Non-pressure chronic ulcer of other part of left foot with other specified severity: Secondary | ICD-10-CM | POA: Diagnosis not present

## 2020-06-15 DIAGNOSIS — Z79899 Other long term (current) drug therapy: Secondary | ICD-10-CM | POA: Diagnosis not present

## 2020-06-15 DIAGNOSIS — L089 Local infection of the skin and subcutaneous tissue, unspecified: Secondary | ICD-10-CM | POA: Diagnosis not present

## 2020-06-15 DIAGNOSIS — E785 Hyperlipidemia, unspecified: Secondary | ICD-10-CM | POA: Diagnosis present

## 2020-06-15 DIAGNOSIS — L97422 Non-pressure chronic ulcer of left heel and midfoot with fat layer exposed: Secondary | ICD-10-CM

## 2020-06-15 DIAGNOSIS — L97423 Non-pressure chronic ulcer of left heel and midfoot with necrosis of muscle: Secondary | ICD-10-CM

## 2020-06-15 DIAGNOSIS — Z9089 Acquired absence of other organs: Secondary | ICD-10-CM | POA: Diagnosis not present

## 2020-06-15 DIAGNOSIS — Z794 Long term (current) use of insulin: Secondary | ICD-10-CM | POA: Diagnosis not present

## 2020-06-15 DIAGNOSIS — M7989 Other specified soft tissue disorders: Secondary | ICD-10-CM | POA: Diagnosis not present

## 2020-06-15 LAB — WOUND CULTURE

## 2020-06-15 NOTE — Progress Notes (Signed)
  Subjective:  Patient ID: James Norton., male    DOB: [birth year],  MRN: [id number]  Chief Complaint  Patient presents with  . Foot Ulcer    F/U Lt ulcer check pt.s tates," looks cleaner tha last week. Therer's tissue growing back but hard skin around it." -pt denies N/V/F/Ch/pain -w/ less redness/swelling and draiange (clear/bornw) Tx: boot, bactrim, packing and dressing     79 y.o. male presents with the above complaint. History confirmed with patient.  Returns for follow-up today.  He notes that it does look clear but looks a lot more swollen.  Its been very red.  Not painful.  He has been packing it and dressing and wearing the boot.  He has been taking Bactrim.  Objective:  Physical Exam: warm, good capillary refill and normal DP and PT pulses. Left Foot: Hallux submet 1 ulceration with significant hyperkeratotic rim.  Fibrogranular wound base with serosanguineous drainage.  The hallux is very red and edematous.  There is crepitus about the hallux when palpated.  No malodor.  Ulceration measuring 1 cm in diameter, it probes deep to the capsule and into the medial and lateral sides of the hallux.   Assessment:   1. Cellulitis and abscess of toe of left foot   2. Diabetic ulcer of left midfoot associated with type 1 diabetes mellitus, with necrosis of muscle ([place])   3. DM type 2 with diabetic peripheral neuropathy (HCC)   4. Charcot's joint arthropathy in type 2 diabetes mellitus ([place])      Plan:  Patient was evaluated and treated and all questions answered.  Today's wound looks worse, he has cellulitis versus acute Charcot flare and erythema to the level of the MTPJ.  I am concerned that he has an infection.  I was unable to take an x-ray today as our Ortho poser is out of order.  The crepitus is concerning for worsening gas infection.  I am sending him to the emergency room for lab work, radiographs, and IV antibiotics.  I notified the provider at the [facility] and  discussed this case with him.  I also discussed the case with Dr. Cannon Kettle.  Ulcer left hallux -Debridement as below. -Dressed with gauze packing, DSD. -Continue off-loading with CAM boot.  Procedure: Excisional Debridement of Wound Rationale: Removal of non-viable soft tissue from the wound to promote healing.  Anesthesia: none Pre-Debridement Wound Measurements: 1.0 cm x 1.0 cm x 4.0 cm  Post-Debridement Wound Measurements: 1.0 cm x 1.0 cm x 4.0 cm  Type of Debridement: Sharp Excisional Tissue Removed: Non-viable soft tissue Depth of Debridement: subcutaneous tissue. Technique: Sharp excisional debridement to bleeding, viable wound base.  Dressing: Dry, sterile, compression dressing. Disposition: Patient tolerated procedure well. Patient to return in 1 week for follow-up.  Procedure: Incision and drainage abscess Following sterile prep with Betadine, the plantar medial hallux was incised with a #15 blade.  Hemostat was used to penetrate to the level of the plane of the crepitance.  Small amount of fibropurulent material was expressed.  This was sent as culture.  Wound is packed open with iodoform packing  Return in about 4 days (around 06/19/2020) for with Dr Cannon Kettle, wound and cellulitis re-check.

## 2020-06-15 NOTE — Addendum Note (Signed)
Addended by: Allean Found on: 06/15/2020 01:09 PM   Modules accepted: Orders

## 2020-06-16 DIAGNOSIS — E11621 Type 2 diabetes mellitus with foot ulcer: Secondary | ICD-10-CM

## 2020-06-16 DIAGNOSIS — M7989 Other specified soft tissue disorders: Secondary | ICD-10-CM

## 2020-06-16 DIAGNOSIS — L03116 Cellulitis of left lower limb: Secondary | ICD-10-CM

## 2020-06-16 DIAGNOSIS — L97529 Non-pressure chronic ulcer of other part of left foot with unspecified severity: Secondary | ICD-10-CM

## 2020-06-18 LAB — WOUND CULTURE: Organism ID, Bacteria: NONE SEEN

## 2020-06-19 ENCOUNTER — Ambulatory Visit: Payer: Medicare Other | Admitting: Sports Medicine

## 2020-06-22 ENCOUNTER — Ambulatory Visit (INDEPENDENT_AMBULATORY_CARE_PROVIDER_SITE_OTHER): Payer: Medicare Other

## 2020-06-22 ENCOUNTER — Ambulatory Visit (INDEPENDENT_AMBULATORY_CARE_PROVIDER_SITE_OTHER): Payer: Medicare Other | Admitting: Podiatry

## 2020-06-22 ENCOUNTER — Other Ambulatory Visit: Payer: Self-pay

## 2020-06-22 DIAGNOSIS — E10621 Type 1 diabetes mellitus with foot ulcer: Secondary | ICD-10-CM

## 2020-06-22 DIAGNOSIS — Z9889 Other specified postprocedural states: Secondary | ICD-10-CM

## 2020-06-22 DIAGNOSIS — E1142 Type 2 diabetes mellitus with diabetic polyneuropathy: Secondary | ICD-10-CM | POA: Diagnosis not present

## 2020-06-22 DIAGNOSIS — L02612 Cutaneous abscess of left foot: Secondary | ICD-10-CM

## 2020-06-22 DIAGNOSIS — L97423 Non-pressure chronic ulcer of left heel and midfoot with necrosis of muscle: Secondary | ICD-10-CM

## 2020-06-22 DIAGNOSIS — L03032 Cellulitis of left toe: Secondary | ICD-10-CM

## 2020-06-22 DIAGNOSIS — D519 Vitamin B12 deficiency anemia, unspecified: Secondary | ICD-10-CM | POA: Diagnosis not present

## 2020-06-22 NOTE — Progress Notes (Signed)
  Subjective:  Patient ID: James Solo., male    DOB: 21-Aug-1941,  MRN: [id number]  Chief Complaint  Patient presents with  . Wound Check    F/U wound check pt states," doing good, looks a lot better and redness and swelling is gone." - pt denies N/V/F?Ch Tx; boot,m packing, dressing daily    79 y.o. male presents for wound care. Hx confirmed with patient. Wearing his CAM boot  Objective:  Physical Exam: Wound Location: left 1st MPJ Wound Measurement: 1x1 Wound Base: Granular/Healthy Peri-wound: Calloused Exudate: None: scant white chalky discharge. Probe to capsule. Circumferential undermining Hallux incision with scant purulent drainage Medial arch without purulent drainage. Assessment:   1. Diabetic ulcer of left midfoot associated with type 1 diabetes mellitus, with necrosis of muscle ([place])   2. Cellulitis and abscess of toe of left foot   3. DM type 2 with diabetic peripheral neuropathy ([place])    Plan:  Patient was evaluated and treated and all questions answered.  Ulcer left foot -XR taken no signs of OM. -Wounds packed with iodoform packing -Unable to culture today no swabs available to do so. -Chalky discharge poss gout? No signs of OM concerning for bone lysis but high risk for OM. -Plan for possible sesamoidectomy at later date -F/u later this week for recheck and possible culture if still with purulent or white drainage. -Continue abx.  No follow-ups on file.

## 2020-06-23 DIAGNOSIS — L03116 Cellulitis of left lower limb: Secondary | ICD-10-CM | POA: Diagnosis not present

## 2020-06-23 DIAGNOSIS — L02612 Cutaneous abscess of left foot: Secondary | ICD-10-CM | POA: Diagnosis not present

## 2020-06-23 DIAGNOSIS — E11628 Type 2 diabetes mellitus with other skin complications: Secondary | ICD-10-CM | POA: Diagnosis not present

## 2020-06-23 DIAGNOSIS — Z794 Long term (current) use of insulin: Secondary | ICD-10-CM | POA: Diagnosis not present

## 2020-06-23 DIAGNOSIS — Z79899 Other long term (current) drug therapy: Secondary | ICD-10-CM | POA: Diagnosis not present

## 2020-06-23 DIAGNOSIS — Z7982 Long term (current) use of aspirin: Secondary | ICD-10-CM | POA: Diagnosis not present

## 2020-06-23 DIAGNOSIS — E782 Mixed hyperlipidemia: Secondary | ICD-10-CM | POA: Diagnosis not present

## 2020-06-23 DIAGNOSIS — I10 Essential (primary) hypertension: Secondary | ICD-10-CM | POA: Diagnosis not present

## 2020-06-25 ENCOUNTER — Ambulatory Visit (INDEPENDENT_AMBULATORY_CARE_PROVIDER_SITE_OTHER): Payer: Medicare Other | Admitting: Podiatry

## 2020-06-25 ENCOUNTER — Other Ambulatory Visit: Payer: Self-pay

## 2020-06-25 ENCOUNTER — Telehealth: Payer: Self-pay

## 2020-06-25 ENCOUNTER — Encounter: Payer: Medicare Other | Admitting: Podiatry

## 2020-06-25 DIAGNOSIS — L02612 Cutaneous abscess of left foot: Secondary | ICD-10-CM | POA: Diagnosis not present

## 2020-06-25 DIAGNOSIS — L03032 Cellulitis of left toe: Secondary | ICD-10-CM

## 2020-06-25 DIAGNOSIS — E1142 Type 2 diabetes mellitus with diabetic polyneuropathy: Secondary | ICD-10-CM

## 2020-06-25 DIAGNOSIS — L97422 Non-pressure chronic ulcer of left heel and midfoot with fat layer exposed: Secondary | ICD-10-CM | POA: Diagnosis not present

## 2020-06-25 DIAGNOSIS — E10621 Type 1 diabetes mellitus with foot ulcer: Secondary | ICD-10-CM | POA: Diagnosis not present

## 2020-06-25 DIAGNOSIS — L97522 Non-pressure chronic ulcer of other part of left foot with fat layer exposed: Secondary | ICD-10-CM

## 2020-06-25 NOTE — Progress Notes (Signed)
  Subjective:  Patient ID: James Solo., male    DOB: 1941-01-01,  MRN: [id number]  No chief complaint on file.  79 y.o. male presents for wound care. States the wounds are doing better, wife continues to pack the wounds. Denies issues, N/V/F/Ch.  Objective:  Physical Exam: Wound Location: left 1st MPJ Wound Measurement: 0.8x0.6 Wound Base: Granular/Healthy Peri-wound: Calloused Exudate: None: scant white chalky discharge. Probe to capsule. Circumferential undermining Hallux incision with scant purulent drainage Medial arch without purulent drainage. Assessment:   1. Ulcer of left foot, with fat layer exposed ([place])   2. Cellulitis and abscess of toe of left foot   3. DM type 2 with diabetic peripheral neuropathy ([place])   4. Diabetic ulcer of left midfoot associated with type 1 diabetes mellitus, with fat layer exposed ([place])    Plan:  Patient was evaluated and treated and all questions answered.  Ulcer left foot -XR taken no signs of OM. -Wounds packed with iodoform packing  -Wound 1st met cultured. -Plan for wound excision and sesamoidectomy in 2 weeks. -Continue abx to completion -Patient has failed all conservative therapy and wishes to proceed with surgical intervention. All risks, benefits, and alternatives discussed with patient. No guarantees given. Consent reviewed and signed by patient. -Planned procedures: left sesamoidectomy with wound closures.   No follow-ups on file.

## 2020-06-25 NOTE — Telephone Encounter (Signed)
Yes that's fine and flush and pack the wounds with iodoform packing, followed by xeroform, 4x4, kerlix, ace

## 2020-06-25 NOTE — Telephone Encounter (Signed)
[facility]. Also requesting to see if they can see pt once/week for 8 wks? Please advice

## 2020-06-26 DIAGNOSIS — E1165 Type 2 diabetes mellitus with hyperglycemia: Secondary | ICD-10-CM | POA: Diagnosis not present

## 2020-06-26 DIAGNOSIS — L02612 Cutaneous abscess of left foot: Secondary | ICD-10-CM | POA: Diagnosis not present

## 2020-06-26 DIAGNOSIS — L97423 Non-pressure chronic ulcer of left heel and midfoot with necrosis of muscle: Secondary | ICD-10-CM | POA: Diagnosis not present

## 2020-06-26 DIAGNOSIS — E1142 Type 2 diabetes mellitus with diabetic polyneuropathy: Secondary | ICD-10-CM | POA: Diagnosis not present

## 2020-06-26 DIAGNOSIS — E11621 Type 2 diabetes mellitus with foot ulcer: Secondary | ICD-10-CM | POA: Diagnosis not present

## 2020-06-26 NOTE — Telephone Encounter (Signed)
Called [facility] back and LVM for Christy to return my phone call to review wound orders for Pt

## 2020-06-28 LAB — WOUND CULTURE

## 2020-06-29 ENCOUNTER — Other Ambulatory Visit: Payer: Self-pay

## 2020-06-29 ENCOUNTER — Ambulatory Visit (INDEPENDENT_AMBULATORY_CARE_PROVIDER_SITE_OTHER): Payer: Medicare Other | Admitting: Podiatry

## 2020-06-29 DIAGNOSIS — L02612 Cutaneous abscess of left foot: Secondary | ICD-10-CM

## 2020-06-29 DIAGNOSIS — L03032 Cellulitis of left toe: Secondary | ICD-10-CM

## 2020-06-29 DIAGNOSIS — Z9889 Other specified postprocedural states: Secondary | ICD-10-CM

## 2020-06-29 NOTE — Progress Notes (Signed)
  Subjective:  Patient ID: James Norton., male    DOB: 03-Feb-1941,  MRN: [id number]  Chief Complaint  Patient presents with  . Wound Check    F/U Lt wound cehck pt states," not any deeped the one on the toe is less maller but the one below the ote is still very deep." - pt completed abx today - no N/V/F/Chj/redness - less draiange and with little swellign    79 y.o. male presents for wound care. States the wounds are doing better, wife continues to pack the wounds. Denies issues, N/V/F/Ch.  Objective:  Physical Exam: Wound Location: left 1st MPJ Wound Measurement: 0.8x0.6 Wound Base: Granular/Healthy Peri-wound: Calloused Exudate: None: slight ss discharge. Probe to capsule. 9 to 12 undermining Hallux incision with scant ss drainage. Medial arch with intact suture and staples. Assessment:   1. Cellulitis and abscess of toe of left foot   2. Post-operative state    Plan:  Patient was evaluated and treated and all questions answered.  Ulcer left foot -Wounds improving -Culture reviewed. S. Epi. Wound does not appear to warrant further abx -Wounds repacked. -Pending debridement, closure, sesamoidectomy.  No follow-ups on file.

## 2020-06-29 NOTE — Telephone Encounter (Signed)
Spoke with [facility] and gave her new wound care orders from Dr. March Rummage given 06/25/20 at 2:42PM

## 2020-06-30 ENCOUNTER — Telehealth: Payer: Self-pay | Admitting: *Deleted

## 2020-06-30 DIAGNOSIS — Z7982 Long term (current) use of aspirin: Secondary | ICD-10-CM | POA: Diagnosis not present

## 2020-06-30 DIAGNOSIS — E11628 Type 2 diabetes mellitus with other skin complications: Secondary | ICD-10-CM | POA: Diagnosis not present

## 2020-06-30 DIAGNOSIS — L02612 Cutaneous abscess of left foot: Secondary | ICD-10-CM | POA: Diagnosis not present

## 2020-06-30 DIAGNOSIS — Z01812 Encounter for preprocedural laboratory examination: Secondary | ICD-10-CM

## 2020-06-30 DIAGNOSIS — I10 Essential (primary) hypertension: Secondary | ICD-10-CM | POA: Diagnosis not present

## 2020-06-30 DIAGNOSIS — E782 Mixed hyperlipidemia: Secondary | ICD-10-CM | POA: Diagnosis not present

## 2020-06-30 DIAGNOSIS — L03116 Cellulitis of left lower limb: Secondary | ICD-10-CM | POA: Diagnosis not present

## 2020-06-30 NOTE — Telephone Encounter (Signed)
I am returning your call.  "I think I was getting ahead of myself.  I am supposed to be scheduled for surgery on August 26.  I saw Dr. March Rummage yesterday and he told me someone from the surgical center would give me a call.  So, I will just wait to hear from them."  You must have a Covid test done 3-4 days prior to your surgery date.  Did they give you your order form while you were at our office?  "No, I didn't receive anything."  I will put the order in.  Can you go by the [facility] office and get the order form?  "Yes, I can."  You can go to [facility] and have it done.  "I'll go by there next week and pick it up."

## 2020-06-30 NOTE — Telephone Encounter (Signed)
"  I'm calling about an appointment on the 26th of this month for surgery with Dr. March Rummage in [place].  When you get theis message, call me back on this number or [phone]."  "It's a Saturday morning about 10:40 am.  I called the other day about an appointment.  I'm a patient of Dr. Jolyn Nap in [place].  On the 26th, he's supposed to do surgery on my foot.  So, that's why I need the appointment for.  Call me back at this number, [phone]."

## 2020-07-02 DIAGNOSIS — E11628 Type 2 diabetes mellitus with other skin complications: Secondary | ICD-10-CM | POA: Diagnosis not present

## 2020-07-02 DIAGNOSIS — L03116 Cellulitis of left lower limb: Secondary | ICD-10-CM | POA: Diagnosis not present

## 2020-07-02 DIAGNOSIS — L02612 Cutaneous abscess of left foot: Secondary | ICD-10-CM | POA: Diagnosis not present

## 2020-07-02 DIAGNOSIS — Z7982 Long term (current) use of aspirin: Secondary | ICD-10-CM | POA: Diagnosis not present

## 2020-07-02 DIAGNOSIS — I10 Essential (primary) hypertension: Secondary | ICD-10-CM | POA: Diagnosis not present

## 2020-07-02 DIAGNOSIS — E782 Mixed hyperlipidemia: Secondary | ICD-10-CM | POA: Diagnosis not present

## 2020-07-06 ENCOUNTER — Other Ambulatory Visit: Payer: Self-pay

## 2020-07-06 ENCOUNTER — Ambulatory Visit (INDEPENDENT_AMBULATORY_CARE_PROVIDER_SITE_OTHER): Payer: Medicare Other | Admitting: Podiatry

## 2020-07-06 DIAGNOSIS — L02612 Cutaneous abscess of left foot: Secondary | ICD-10-CM

## 2020-07-06 DIAGNOSIS — E10621 Type 1 diabetes mellitus with foot ulcer: Secondary | ICD-10-CM | POA: Diagnosis not present

## 2020-07-06 DIAGNOSIS — L97422 Non-pressure chronic ulcer of left heel and midfoot with fat layer exposed: Secondary | ICD-10-CM

## 2020-07-06 DIAGNOSIS — Z1152 Encounter for screening for COVID-19: Secondary | ICD-10-CM | POA: Diagnosis not present

## 2020-07-06 DIAGNOSIS — Z01818 Encounter for other preprocedural examination: Secondary | ICD-10-CM | POA: Diagnosis not present

## 2020-07-06 DIAGNOSIS — L03032 Cellulitis of left toe: Secondary | ICD-10-CM | POA: Diagnosis not present

## 2020-07-06 DIAGNOSIS — Z03818 Encounter for observation for suspected exposure to other biological agents ruled out: Secondary | ICD-10-CM | POA: Diagnosis not present

## 2020-07-09 ENCOUNTER — Encounter: Payer: Self-pay | Admitting: Podiatry

## 2020-07-09 DIAGNOSIS — M869 Osteomyelitis, unspecified: Secondary | ICD-10-CM | POA: Diagnosis not present

## 2020-07-09 DIAGNOSIS — L97522 Non-pressure chronic ulcer of other part of left foot with fat layer exposed: Secondary | ICD-10-CM | POA: Diagnosis not present

## 2020-07-09 DIAGNOSIS — D2372 Other benign neoplasm of skin of left lower limb, including hip: Secondary | ICD-10-CM | POA: Diagnosis not present

## 2020-07-09 DIAGNOSIS — E11621 Type 2 diabetes mellitus with foot ulcer: Secondary | ICD-10-CM | POA: Diagnosis not present

## 2020-07-09 DIAGNOSIS — I10 Essential (primary) hypertension: Secondary | ICD-10-CM | POA: Diagnosis not present

## 2020-07-09 DIAGNOSIS — D239 Other benign neoplasm of skin, unspecified: Secondary | ICD-10-CM | POA: Diagnosis not present

## 2020-07-09 DIAGNOSIS — L97528 Non-pressure chronic ulcer of other part of left foot with other specified severity: Secondary | ICD-10-CM | POA: Diagnosis not present

## 2020-07-09 DIAGNOSIS — M898X7 Other specified disorders of bone, ankle and foot: Secondary | ICD-10-CM | POA: Diagnosis not present

## 2020-07-09 DIAGNOSIS — M84872 Other disorders of continuity of bone, left ankle and foot: Secondary | ICD-10-CM | POA: Diagnosis not present

## 2020-07-09 DIAGNOSIS — G629 Polyneuropathy, unspecified: Secondary | ICD-10-CM | POA: Diagnosis not present

## 2020-07-09 DIAGNOSIS — E785 Hyperlipidemia, unspecified: Secondary | ICD-10-CM | POA: Diagnosis not present

## 2020-07-09 DIAGNOSIS — E119 Type 2 diabetes mellitus without complications: Secondary | ICD-10-CM | POA: Diagnosis not present

## 2020-07-09 DIAGNOSIS — Z7982 Long term (current) use of aspirin: Secondary | ICD-10-CM | POA: Diagnosis not present

## 2020-07-09 DIAGNOSIS — L02612 Cutaneous abscess of left foot: Secondary | ICD-10-CM | POA: Diagnosis not present

## 2020-07-09 DIAGNOSIS — L03032 Cellulitis of left toe: Secondary | ICD-10-CM | POA: Diagnosis not present

## 2020-07-09 DIAGNOSIS — Z794 Long term (current) use of insulin: Secondary | ICD-10-CM | POA: Diagnosis not present

## 2020-07-09 DIAGNOSIS — Z79899 Other long term (current) drug therapy: Secondary | ICD-10-CM | POA: Diagnosis not present

## 2020-07-09 HISTORY — PX: OTHER SURGICAL HISTORY: SHX169

## 2020-07-09 MED ORDER — DOXYCYCLINE HYCLATE 100 MG PO TABS
100.0000 mg | ORAL_TABLET | Freq: Two times a day (BID) | ORAL | 0 refills | Status: DC
Start: 2020-07-09 — End: 2020-08-20

## 2020-07-10 DIAGNOSIS — Z7982 Long term (current) use of aspirin: Secondary | ICD-10-CM | POA: Diagnosis not present

## 2020-07-10 DIAGNOSIS — I10 Essential (primary) hypertension: Secondary | ICD-10-CM | POA: Diagnosis not present

## 2020-07-10 DIAGNOSIS — L02612 Cutaneous abscess of left foot: Secondary | ICD-10-CM | POA: Diagnosis not present

## 2020-07-10 DIAGNOSIS — L03116 Cellulitis of left lower limb: Secondary | ICD-10-CM | POA: Diagnosis not present

## 2020-07-10 DIAGNOSIS — E782 Mixed hyperlipidemia: Secondary | ICD-10-CM | POA: Diagnosis not present

## 2020-07-10 DIAGNOSIS — E11628 Type 2 diabetes mellitus with other skin complications: Secondary | ICD-10-CM | POA: Diagnosis not present

## 2020-07-13 ENCOUNTER — Other Ambulatory Visit: Payer: Self-pay

## 2020-07-13 ENCOUNTER — Ambulatory Visit (INDEPENDENT_AMBULATORY_CARE_PROVIDER_SITE_OTHER): Payer: Medicare Other | Admitting: Podiatry

## 2020-07-13 DIAGNOSIS — Z9889 Other specified postprocedural states: Secondary | ICD-10-CM

## 2020-07-13 DIAGNOSIS — L02612 Cutaneous abscess of left foot: Secondary | ICD-10-CM

## 2020-07-13 DIAGNOSIS — L03032 Cellulitis of left toe: Secondary | ICD-10-CM

## 2020-07-13 NOTE — Progress Notes (Signed)
  Subjective:  Patient ID: James Solo., male    DOB: May 21, 1941,  MRN: [id number]  Chief Complaint  Patient presents with  . Routine Post Op    POV#1 pt states there is no complaints/pain -pt denies N/V/F/Ch Tx: packing dressing daily, abx and cam boot    DOS: 07/09/20 Procedure: Tibial Sesamoidectomy left  79 y.o. male presents with the above complaint. History confirmed with patient. Packing the wound at home as directed.  Objective:  Physical Exam: tenderness at the surgical site, local edema noted and calf supple, nontender. Incision: healing well, no significant drainage, no dehiscence, no significant erythema  Assessment:   1. Cellulitis and abscess of toe of left foot   2. Post-operative state    Plan:  Patient was evaluated and treated and all questions answered.  Post-operative State -Dressing applied consisting of iodoform packing, 4x4, kerlix, ACE -WBAT in CAM boot -Wound continues to probe deep and will take time to heal.  No follow-ups on file.

## 2020-07-16 ENCOUNTER — Encounter: Payer: Medicare Other | Admitting: Podiatry

## 2020-07-16 DIAGNOSIS — E782 Mixed hyperlipidemia: Secondary | ICD-10-CM | POA: Diagnosis not present

## 2020-07-16 DIAGNOSIS — L03116 Cellulitis of left lower limb: Secondary | ICD-10-CM | POA: Diagnosis not present

## 2020-07-16 DIAGNOSIS — Z7982 Long term (current) use of aspirin: Secondary | ICD-10-CM | POA: Diagnosis not present

## 2020-07-16 DIAGNOSIS — I10 Essential (primary) hypertension: Secondary | ICD-10-CM | POA: Diagnosis not present

## 2020-07-16 DIAGNOSIS — E11628 Type 2 diabetes mellitus with other skin complications: Secondary | ICD-10-CM | POA: Diagnosis not present

## 2020-07-16 DIAGNOSIS — L02612 Cutaneous abscess of left foot: Secondary | ICD-10-CM | POA: Diagnosis not present

## 2020-07-16 NOTE — Progress Notes (Signed)
  Subjective:  Patient ID: James Solo., male    DOB: 1941/03/25,  MRN: [id number]  No chief complaint on file.  79 y.o. male presents for wound care. Denies issues, thinks the wound is doing ok. Still draining, wife has been packing. Denies issues, N/V/F/Ch.  Objective:  Physical Exam: Wound Location: left 1st MPJ Wound Measurement: 0.8x0.6 Wound Base: Granular/Healthy Peri-wound: Calloused Exudate: None: slight ss discharge. Probe to capsule. 9 to 12 undermining Hallux incision with scant ss drainage. Medial arch with intact suture and staples. Assessment:   1. Preop testing   2. Diabetic ulcer of left midfoot associated with type 1 diabetes mellitus, with fat layer exposed ([place])   3. Cellulitis and abscess of toe of left foot    Plan:  Patient was evaluated and treated and all questions answered.  Ulcer left foot -Pending surgery for sesamoidectomy. No signs of infection noted today -Packed with iodosorb packing and DSD  No follow-ups on file.

## 2020-07-23 ENCOUNTER — Encounter: Payer: Medicare Other | Admitting: Podiatry

## 2020-07-23 DIAGNOSIS — E782 Mixed hyperlipidemia: Secondary | ICD-10-CM | POA: Diagnosis not present

## 2020-07-23 DIAGNOSIS — Z79899 Other long term (current) drug therapy: Secondary | ICD-10-CM | POA: Diagnosis not present

## 2020-07-23 DIAGNOSIS — D519 Vitamin B12 deficiency anemia, unspecified: Secondary | ICD-10-CM | POA: Diagnosis not present

## 2020-07-23 DIAGNOSIS — Z794 Long term (current) use of insulin: Secondary | ICD-10-CM | POA: Diagnosis not present

## 2020-07-23 DIAGNOSIS — L02612 Cutaneous abscess of left foot: Secondary | ICD-10-CM | POA: Diagnosis not present

## 2020-07-23 DIAGNOSIS — L03116 Cellulitis of left lower limb: Secondary | ICD-10-CM | POA: Diagnosis not present

## 2020-07-23 DIAGNOSIS — Z7982 Long term (current) use of aspirin: Secondary | ICD-10-CM | POA: Diagnosis not present

## 2020-07-23 DIAGNOSIS — I10 Essential (primary) hypertension: Secondary | ICD-10-CM | POA: Diagnosis not present

## 2020-07-23 DIAGNOSIS — E11628 Type 2 diabetes mellitus with other skin complications: Secondary | ICD-10-CM | POA: Diagnosis not present

## 2020-07-24 DIAGNOSIS — Z7982 Long term (current) use of aspirin: Secondary | ICD-10-CM | POA: Diagnosis not present

## 2020-07-24 DIAGNOSIS — E782 Mixed hyperlipidemia: Secondary | ICD-10-CM | POA: Diagnosis not present

## 2020-07-24 DIAGNOSIS — I10 Essential (primary) hypertension: Secondary | ICD-10-CM | POA: Diagnosis not present

## 2020-07-24 DIAGNOSIS — L02612 Cutaneous abscess of left foot: Secondary | ICD-10-CM | POA: Diagnosis not present

## 2020-07-24 DIAGNOSIS — E11628 Type 2 diabetes mellitus with other skin complications: Secondary | ICD-10-CM | POA: Diagnosis not present

## 2020-07-24 DIAGNOSIS — L03116 Cellulitis of left lower limb: Secondary | ICD-10-CM | POA: Diagnosis not present

## 2020-07-28 DIAGNOSIS — Z7982 Long term (current) use of aspirin: Secondary | ICD-10-CM | POA: Diagnosis not present

## 2020-07-28 DIAGNOSIS — I10 Essential (primary) hypertension: Secondary | ICD-10-CM | POA: Diagnosis not present

## 2020-07-28 DIAGNOSIS — L02612 Cutaneous abscess of left foot: Secondary | ICD-10-CM | POA: Diagnosis not present

## 2020-07-28 DIAGNOSIS — E782 Mixed hyperlipidemia: Secondary | ICD-10-CM | POA: Diagnosis not present

## 2020-07-28 DIAGNOSIS — L03116 Cellulitis of left lower limb: Secondary | ICD-10-CM | POA: Diagnosis not present

## 2020-07-28 DIAGNOSIS — E11628 Type 2 diabetes mellitus with other skin complications: Secondary | ICD-10-CM | POA: Diagnosis not present

## 2020-07-30 ENCOUNTER — Other Ambulatory Visit: Payer: Self-pay

## 2020-07-30 ENCOUNTER — Ambulatory Visit (INDEPENDENT_AMBULATORY_CARE_PROVIDER_SITE_OTHER): Payer: Medicare Other | Admitting: Podiatry

## 2020-07-30 ENCOUNTER — Encounter: Payer: Self-pay | Admitting: Podiatry

## 2020-07-30 DIAGNOSIS — L02612 Cutaneous abscess of left foot: Secondary | ICD-10-CM

## 2020-07-30 DIAGNOSIS — L03032 Cellulitis of left toe: Secondary | ICD-10-CM

## 2020-07-30 MED ORDER — CIPROFLOXACIN HCL 250 MG PO TABS
250.0000 mg | ORAL_TABLET | Freq: Two times a day (BID) | ORAL | 0 refills | Status: DC
Start: 2020-07-30 — End: 2020-08-06

## 2020-08-04 DIAGNOSIS — L02612 Cutaneous abscess of left foot: Secondary | ICD-10-CM | POA: Diagnosis not present

## 2020-08-04 DIAGNOSIS — I10 Essential (primary) hypertension: Secondary | ICD-10-CM | POA: Diagnosis not present

## 2020-08-04 DIAGNOSIS — L03116 Cellulitis of left lower limb: Secondary | ICD-10-CM | POA: Diagnosis not present

## 2020-08-04 DIAGNOSIS — Z7982 Long term (current) use of aspirin: Secondary | ICD-10-CM | POA: Diagnosis not present

## 2020-08-04 DIAGNOSIS — E11628 Type 2 diabetes mellitus with other skin complications: Secondary | ICD-10-CM | POA: Diagnosis not present

## 2020-08-04 DIAGNOSIS — E782 Mixed hyperlipidemia: Secondary | ICD-10-CM | POA: Diagnosis not present

## 2020-08-06 ENCOUNTER — Ambulatory Visit (INDEPENDENT_AMBULATORY_CARE_PROVIDER_SITE_OTHER): Payer: Medicare Other | Admitting: Podiatry

## 2020-08-06 ENCOUNTER — Other Ambulatory Visit: Payer: Self-pay

## 2020-08-06 DIAGNOSIS — L97422 Non-pressure chronic ulcer of left heel and midfoot with fat layer exposed: Secondary | ICD-10-CM

## 2020-08-06 DIAGNOSIS — E10621 Type 1 diabetes mellitus with foot ulcer: Secondary | ICD-10-CM | POA: Diagnosis not present

## 2020-08-06 MED ORDER — CIPROFLOXACIN HCL 250 MG PO TABS: 250.0000 mg | ORAL_TABLET | Freq: Two times a day (BID) | ORAL | 0 refills | Status: DC

## 2020-08-06 NOTE — Patient Instructions (Signed)
Soak Instructions    THE DAY AFTER THE PROCEDURE  Place 1/4 cup of epsom salts in a quart of warm tap water.  Submerge your foot and soak in the solution for 20 minutes.  This soak should be done twice a day.  Next, remove your foot or feet from solution, blot dry the affected area and dress as directed.

## 2020-08-06 NOTE — Progress Notes (Signed)
  Subjective:  Patient ID: James Solo., male    DOB: Aug 06, 1941,  MRN: [id number]  No chief complaint on file.   DOS: 07/09/20 Procedure: Tibial Sesamoidectomy left  79 y.o. male presents for folllow up. States the wound is doing better but is still deep. Taking abx as directed.  Objective:  Physical Exam: tenderness at the surgical site, local edema noted and calf supple, nontender. Incision: almost healed externally but with deep probing without probe to bone.  Assessment:   1. Diabetic ulcer of left midfoot associated with type 1 diabetes mellitus, with fat layer exposed ([place])    Plan:  Patient was evaluated and treated and all questions answered.  Post-operative State -WBAT in CAM boot -Wound continues to probe deep but exterior healing well. Packed with prisma today, followed by iodoform packing. Patient to do so daily at home and with assistance of [place].  Return in about 1 week (around 08/13/2020).

## 2020-08-07 LAB — WOUND CULTURE

## 2020-08-10 DIAGNOSIS — D509 Iron deficiency anemia, unspecified: Secondary | ICD-10-CM | POA: Diagnosis not present

## 2020-08-10 DIAGNOSIS — E785 Hyperlipidemia, unspecified: Secondary | ICD-10-CM | POA: Diagnosis not present

## 2020-08-10 DIAGNOSIS — E1142 Type 2 diabetes mellitus with diabetic polyneuropathy: Secondary | ICD-10-CM | POA: Diagnosis not present

## 2020-08-10 DIAGNOSIS — D519 Vitamin B12 deficiency anemia, unspecified: Secondary | ICD-10-CM | POA: Diagnosis not present

## 2020-08-11 DIAGNOSIS — I10 Essential (primary) hypertension: Secondary | ICD-10-CM | POA: Diagnosis not present

## 2020-08-11 DIAGNOSIS — E11628 Type 2 diabetes mellitus with other skin complications: Secondary | ICD-10-CM | POA: Diagnosis not present

## 2020-08-11 DIAGNOSIS — E782 Mixed hyperlipidemia: Secondary | ICD-10-CM | POA: Diagnosis not present

## 2020-08-11 DIAGNOSIS — Z7982 Long term (current) use of aspirin: Secondary | ICD-10-CM | POA: Diagnosis not present

## 2020-08-11 DIAGNOSIS — L03116 Cellulitis of left lower limb: Secondary | ICD-10-CM | POA: Diagnosis not present

## 2020-08-11 DIAGNOSIS — L02612 Cutaneous abscess of left foot: Secondary | ICD-10-CM | POA: Diagnosis not present

## 2020-08-13 ENCOUNTER — Other Ambulatory Visit: Payer: Self-pay

## 2020-08-13 ENCOUNTER — Ambulatory Visit (INDEPENDENT_AMBULATORY_CARE_PROVIDER_SITE_OTHER): Payer: Medicare Other | Admitting: Podiatry

## 2020-08-13 ENCOUNTER — Encounter: Payer: Self-pay | Admitting: Podiatry

## 2020-08-13 DIAGNOSIS — E1142 Type 2 diabetes mellitus with diabetic polyneuropathy: Secondary | ICD-10-CM | POA: Diagnosis not present

## 2020-08-13 DIAGNOSIS — E785 Hyperlipidemia, unspecified: Secondary | ICD-10-CM | POA: Diagnosis not present

## 2020-08-13 DIAGNOSIS — E10621 Type 1 diabetes mellitus with foot ulcer: Secondary | ICD-10-CM | POA: Diagnosis not present

## 2020-08-13 DIAGNOSIS — D519 Vitamin B12 deficiency anemia, unspecified: Secondary | ICD-10-CM | POA: Diagnosis not present

## 2020-08-13 DIAGNOSIS — L97422 Non-pressure chronic ulcer of left heel and midfoot with fat layer exposed: Secondary | ICD-10-CM

## 2020-08-13 DIAGNOSIS — D509 Iron deficiency anemia, unspecified: Secondary | ICD-10-CM | POA: Diagnosis not present

## 2020-08-13 DIAGNOSIS — E1165 Type 2 diabetes mellitus with hyperglycemia: Secondary | ICD-10-CM | POA: Diagnosis not present

## 2020-08-13 DIAGNOSIS — Z23 Encounter for immunization: Secondary | ICD-10-CM | POA: Diagnosis not present

## 2020-08-13 DIAGNOSIS — I10 Essential (primary) hypertension: Secondary | ICD-10-CM | POA: Diagnosis not present

## 2020-08-13 MED ORDER — CIPROFLOXACIN HCL 250 MG PO TABS: 250.0000 mg | ORAL_TABLET | Freq: Two times a day (BID) | ORAL | 0 refills | Status: DC

## 2020-08-13 NOTE — Progress Notes (Signed)
°  Subjective:  Patient ID: James Solo., male    DOB: 12/13/1940,  MRN: [id number]  Chief Complaint  Patient presents with   Routine Post Op    doing better than i was on the left foot     DOS: 07/09/20 Procedure: Tibial Sesamoidectomy left  79 y.o. male presents for folllow up. States the wound is doing better but is still deep.  On his last day of antibiotics.  Thinks the wound continues to improve  Objective:  Physical Exam: tenderness at the surgical site, local edema noted and calf supple, nontender. Incision: almost healed externally but with deep probing without probe to bone.  Assessment:   1. Diabetic ulcer of left midfoot associated with type 1 diabetes mellitus, with fat layer exposed ([place])    Plan:  Patient was evaluated and treated and all questions answered.  Post-operative State -WBAT in CAM boot -Wound continues to improve the deep aspect of the bone appears to be lessening.  There is still some sanguinopurulent drainage.  New culture performed today.  Refill ciprofloxacin -X-rays next visit    No follow-ups on file.

## 2020-08-13 NOTE — Progress Notes (Signed)
  Subjective:  Patient ID: James Solo., male    DOB: 10-30-1941,  MRN: [id number]  Chief Complaint  Patient presents with  . Routine Post Op    better than i was and i have staples and stitches     DOS: 07/09/20 Procedure: Tibial Sesamoidectomy left  79 y.o. male presents with the above complaint. History confirmed with patient.  52 however it still does packed very deep when his wife packs it.  Objective:  Physical Exam: tenderness at the surgical site, local edema noted and calf supple, nontender. Incision: healing well, drainage from the plantar wound that is seropurulent in nature, no dehiscence, no significant erythema  Assessment:   1. Cellulitis and abscess of toe of left foot    Plan:  Patient was evaluated and treated and all questions answered.  Post-operative State -Sutures and staples removed -Wound culture taken of the wound -Rx ciprofloxacin -Continue to pack wound daily   No follow-ups on file.

## 2020-08-18 DIAGNOSIS — E11628 Type 2 diabetes mellitus with other skin complications: Secondary | ICD-10-CM | POA: Diagnosis not present

## 2020-08-18 DIAGNOSIS — Z7982 Long term (current) use of aspirin: Secondary | ICD-10-CM | POA: Diagnosis not present

## 2020-08-18 DIAGNOSIS — I10 Essential (primary) hypertension: Secondary | ICD-10-CM | POA: Diagnosis not present

## 2020-08-18 DIAGNOSIS — E782 Mixed hyperlipidemia: Secondary | ICD-10-CM | POA: Diagnosis not present

## 2020-08-18 DIAGNOSIS — L03116 Cellulitis of left lower limb: Secondary | ICD-10-CM | POA: Diagnosis not present

## 2020-08-18 DIAGNOSIS — L02612 Cutaneous abscess of left foot: Secondary | ICD-10-CM | POA: Diagnosis not present

## 2020-08-19 LAB — WOUND CULTURE: Organism ID, Bacteria: NONE SEEN

## 2020-08-20 ENCOUNTER — Other Ambulatory Visit: Payer: Self-pay

## 2020-08-20 ENCOUNTER — Ambulatory Visit (INDEPENDENT_AMBULATORY_CARE_PROVIDER_SITE_OTHER): Payer: Medicare Other | Admitting: Podiatry

## 2020-08-20 ENCOUNTER — Encounter: Payer: Self-pay | Admitting: Podiatry

## 2020-08-20 ENCOUNTER — Ambulatory Visit: Payer: Medicare Other

## 2020-08-20 DIAGNOSIS — L97422 Non-pressure chronic ulcer of left heel and midfoot with fat layer exposed: Secondary | ICD-10-CM | POA: Diagnosis not present

## 2020-08-20 DIAGNOSIS — E10621 Type 1 diabetes mellitus with foot ulcer: Secondary | ICD-10-CM

## 2020-08-20 MED ORDER — CIPROFLOXACIN HCL 250 MG PO TABS: 250.0000 mg | ORAL_TABLET | Freq: Two times a day (BID) | ORAL | 0 refills | Status: DC

## 2020-08-20 NOTE — Progress Notes (Signed)
  Subjective:  Patient ID: James Solo., male    DOB: 29-Jun-1941,  MRN: [id number]  Chief Complaint  Patient presents with  . Routine Post Op    pov #4 dos 8.26.2021 Sesamoidectomy Hallux Lt; Wound Closure of Hallux Toe and 1st Metatarsal Wounds Lt    DOS: 07/09/20 Procedure: Tibial Sesamoidectomy left  79 y.o. male presents for folllow up. Has taken abx, denies issues. Wife present today thinks the wound is doing better and not as deep. Had some increased bloody drainage yesterday.  Objective:  Physical Exam: tenderness at the surgical site, local edema noted and calf supple, nontender. Incision: almost healed externally but with deep probing without probe to bone. Does not probe as deep as previous.  Assessment:   1. Diabetic ulcer of left midfoot associated with type 1 diabetes mellitus, with fat layer exposed ([place])    Plan:  Patient was evaluated and treated and all questions answered.  Post-operative State -WBAT in CAM boot -Wound continues to improve. -XR today without signs of osseous erosions or signs of infection -Refill cipro based on previous culture. Return in about 1 week (around 08/27/2020).

## 2020-08-21 ENCOUNTER — Telehealth: Payer: Self-pay | Admitting: *Deleted

## 2020-08-21 NOTE — Telephone Encounter (Signed)
Called and spoke with Alyse Low from [facility] [phone] relayed the message per Dr March Rummage

## 2020-08-21 NOTE — Telephone Encounter (Signed)
-----   Message from Evelina Bucy, DPM sent at 08/19/2020  5:55 PM EDT ----- Regarding: RE: Yes that is ok ----- Message ----- From: Viviana Simpler, PMAC Sent: 08/19/2020   8:59 AM EDT To: Evelina Bucy, [place] called and wanted to get verbal orders for once a week for nine weeks and wanted to make sure that was ok. Lattie Haw

## 2020-08-22 DIAGNOSIS — E782 Mixed hyperlipidemia: Secondary | ICD-10-CM | POA: Diagnosis not present

## 2020-08-22 DIAGNOSIS — L03116 Cellulitis of left lower limb: Secondary | ICD-10-CM | POA: Diagnosis not present

## 2020-08-22 DIAGNOSIS — L02612 Cutaneous abscess of left foot: Secondary | ICD-10-CM | POA: Diagnosis not present

## 2020-08-22 DIAGNOSIS — Z7982 Long term (current) use of aspirin: Secondary | ICD-10-CM | POA: Diagnosis not present

## 2020-08-22 DIAGNOSIS — Z79899 Other long term (current) drug therapy: Secondary | ICD-10-CM | POA: Diagnosis not present

## 2020-08-22 DIAGNOSIS — Z794 Long term (current) use of insulin: Secondary | ICD-10-CM | POA: Diagnosis not present

## 2020-08-22 DIAGNOSIS — E11628 Type 2 diabetes mellitus with other skin complications: Secondary | ICD-10-CM | POA: Diagnosis not present

## 2020-08-22 DIAGNOSIS — I10 Essential (primary) hypertension: Secondary | ICD-10-CM | POA: Diagnosis not present

## 2020-08-24 DIAGNOSIS — D519 Vitamin B12 deficiency anemia, unspecified: Secondary | ICD-10-CM | POA: Diagnosis not present

## 2020-08-25 DIAGNOSIS — L02612 Cutaneous abscess of left foot: Secondary | ICD-10-CM | POA: Diagnosis not present

## 2020-08-25 DIAGNOSIS — I10 Essential (primary) hypertension: Secondary | ICD-10-CM | POA: Diagnosis not present

## 2020-08-25 DIAGNOSIS — E11628 Type 2 diabetes mellitus with other skin complications: Secondary | ICD-10-CM | POA: Diagnosis not present

## 2020-08-25 DIAGNOSIS — L03116 Cellulitis of left lower limb: Secondary | ICD-10-CM | POA: Diagnosis not present

## 2020-08-25 DIAGNOSIS — Z7982 Long term (current) use of aspirin: Secondary | ICD-10-CM | POA: Diagnosis not present

## 2020-08-25 DIAGNOSIS — E782 Mixed hyperlipidemia: Secondary | ICD-10-CM | POA: Diagnosis not present

## 2020-08-27 ENCOUNTER — Other Ambulatory Visit: Payer: Self-pay

## 2020-08-27 ENCOUNTER — Ambulatory Visit (INDEPENDENT_AMBULATORY_CARE_PROVIDER_SITE_OTHER): Payer: Medicare Other | Admitting: Podiatry

## 2020-08-27 ENCOUNTER — Encounter: Payer: Self-pay | Admitting: Podiatry

## 2020-08-27 ENCOUNTER — Telehealth: Payer: Self-pay | Admitting: *Deleted

## 2020-08-27 DIAGNOSIS — E10621 Type 1 diabetes mellitus with foot ulcer: Secondary | ICD-10-CM | POA: Diagnosis not present

## 2020-08-27 DIAGNOSIS — M86672 Other chronic osteomyelitis, left ankle and foot: Secondary | ICD-10-CM

## 2020-08-27 DIAGNOSIS — L97422 Non-pressure chronic ulcer of left heel and midfoot with fat layer exposed: Secondary | ICD-10-CM

## 2020-08-27 MED ORDER — CIPROFLOXACIN HCL 250 MG PO TABS
250.0000 mg | ORAL_TABLET | Freq: Two times a day (BID) | ORAL | 0 refills | Status: DC
Start: 2020-08-27 — End: 2020-09-10

## 2020-08-27 NOTE — Telephone Encounter (Signed)
Patient has a MRI left foot w/o at [facility] on 09/01/2020 at 12 pm and arrival time of 11:45 am and spoke with Kathlee Nations. Lattie Haw

## 2020-08-27 NOTE — Progress Notes (Signed)
  Subjective:  Patient ID: James Norton., male    DOB: [birth year],  MRN: [id number]  Chief Complaint  Patient presents with  . Routine Post Op    i am still draining on the left foot     DOS: 07/09/20 Procedure: Tibial Sesamoidectomy left  79 y.o. male presents for folllow up. Still having a lot of drainage at the wound. Wife states it was more yellow yesterday, today more red. Denies warmth, erythema, signs of acute infection.  Objective:  Physical Exam: tenderness at the surgical site, local edema noted and calf supple, nontender. Incision: almost healed externally but with deep probing without probe to bone. Probes 12 o clock approx 2 cm  Assessment:   1. Diabetic ulcer of left midfoot associated with type 1 diabetes mellitus, with fat layer exposed ([place])   2. Chronic osteomyelitis involving left ankle and foot ([place])    Plan:  Patient was evaluated and treated and all questions answered.  Post-operative State -WBAT in surgical shoe. Shoe dispensed. -Refill cipro.  -Order MRI to eval for chronic OM.  No follow-ups on file.

## 2020-09-01 DIAGNOSIS — L97529 Non-pressure chronic ulcer of other part of left foot with unspecified severity: Secondary | ICD-10-CM | POA: Diagnosis not present

## 2020-09-01 DIAGNOSIS — I10 Essential (primary) hypertension: Secondary | ICD-10-CM | POA: Diagnosis not present

## 2020-09-01 DIAGNOSIS — Z23 Encounter for immunization: Secondary | ICD-10-CM | POA: Diagnosis not present

## 2020-09-01 DIAGNOSIS — L02612 Cutaneous abscess of left foot: Secondary | ICD-10-CM | POA: Diagnosis not present

## 2020-09-01 DIAGNOSIS — E11628 Type 2 diabetes mellitus with other skin complications: Secondary | ICD-10-CM | POA: Diagnosis not present

## 2020-09-01 DIAGNOSIS — E782 Mixed hyperlipidemia: Secondary | ICD-10-CM | POA: Diagnosis not present

## 2020-09-01 DIAGNOSIS — Z7982 Long term (current) use of aspirin: Secondary | ICD-10-CM | POA: Diagnosis not present

## 2020-09-01 DIAGNOSIS — L03116 Cellulitis of left lower limb: Secondary | ICD-10-CM | POA: Diagnosis not present

## 2020-09-07 ENCOUNTER — Encounter: Payer: Self-pay | Admitting: Podiatry

## 2020-09-07 ENCOUNTER — Other Ambulatory Visit: Payer: Self-pay

## 2020-09-07 ENCOUNTER — Ambulatory Visit (INDEPENDENT_AMBULATORY_CARE_PROVIDER_SITE_OTHER): Payer: Medicare Other | Admitting: Podiatry

## 2020-09-07 DIAGNOSIS — L97422 Non-pressure chronic ulcer of left heel and midfoot with fat layer exposed: Secondary | ICD-10-CM

## 2020-09-07 DIAGNOSIS — M86672 Other chronic osteomyelitis, left ankle and foot: Secondary | ICD-10-CM | POA: Diagnosis not present

## 2020-09-07 DIAGNOSIS — L03032 Cellulitis of left toe: Secondary | ICD-10-CM

## 2020-09-07 DIAGNOSIS — E10621 Type 1 diabetes mellitus with foot ulcer: Secondary | ICD-10-CM

## 2020-09-07 DIAGNOSIS — L02612 Cutaneous abscess of left foot: Secondary | ICD-10-CM

## 2020-09-07 DIAGNOSIS — Z1152 Encounter for screening for COVID-19: Secondary | ICD-10-CM | POA: Diagnosis not present

## 2020-09-08 DIAGNOSIS — E11628 Type 2 diabetes mellitus with other skin complications: Secondary | ICD-10-CM | POA: Diagnosis not present

## 2020-09-08 DIAGNOSIS — I10 Essential (primary) hypertension: Secondary | ICD-10-CM | POA: Diagnosis not present

## 2020-09-08 DIAGNOSIS — E782 Mixed hyperlipidemia: Secondary | ICD-10-CM | POA: Diagnosis not present

## 2020-09-08 DIAGNOSIS — L02612 Cutaneous abscess of left foot: Secondary | ICD-10-CM | POA: Diagnosis not present

## 2020-09-08 DIAGNOSIS — L03116 Cellulitis of left lower limb: Secondary | ICD-10-CM | POA: Diagnosis not present

## 2020-09-08 DIAGNOSIS — Z7982 Long term (current) use of aspirin: Secondary | ICD-10-CM | POA: Diagnosis not present

## 2020-09-10 ENCOUNTER — Encounter: Payer: Self-pay | Admitting: Podiatry

## 2020-09-10 ENCOUNTER — Other Ambulatory Visit: Payer: Self-pay | Admitting: Podiatry

## 2020-09-10 DIAGNOSIS — M19072 Primary osteoarthritis, left ankle and foot: Secondary | ICD-10-CM | POA: Diagnosis not present

## 2020-09-10 DIAGNOSIS — J302 Other seasonal allergic rhinitis: Secondary | ICD-10-CM | POA: Diagnosis not present

## 2020-09-10 DIAGNOSIS — E11621 Type 2 diabetes mellitus with foot ulcer: Secondary | ICD-10-CM | POA: Diagnosis not present

## 2020-09-10 DIAGNOSIS — Z7982 Long term (current) use of aspirin: Secondary | ICD-10-CM | POA: Diagnosis not present

## 2020-09-10 DIAGNOSIS — E785 Hyperlipidemia, unspecified: Secondary | ICD-10-CM | POA: Diagnosis not present

## 2020-09-10 DIAGNOSIS — M86172 Other acute osteomyelitis, left ankle and foot: Secondary | ICD-10-CM | POA: Diagnosis not present

## 2020-09-10 DIAGNOSIS — Z7984 Long term (current) use of oral hypoglycemic drugs: Secondary | ICD-10-CM | POA: Diagnosis not present

## 2020-09-10 DIAGNOSIS — Z79899 Other long term (current) drug therapy: Secondary | ICD-10-CM | POA: Diagnosis not present

## 2020-09-10 DIAGNOSIS — I10 Essential (primary) hypertension: Secondary | ICD-10-CM | POA: Diagnosis not present

## 2020-09-10 DIAGNOSIS — M898X7 Other specified disorders of bone, ankle and foot: Secondary | ICD-10-CM | POA: Diagnosis not present

## 2020-09-10 DIAGNOSIS — L97528 Non-pressure chronic ulcer of other part of left foot with other specified severity: Secondary | ICD-10-CM | POA: Diagnosis not present

## 2020-09-10 DIAGNOSIS — L97422 Non-pressure chronic ulcer of left heel and midfoot with fat layer exposed: Secondary | ICD-10-CM | POA: Diagnosis not present

## 2020-09-10 DIAGNOSIS — M869 Osteomyelitis, unspecified: Secondary | ICD-10-CM | POA: Diagnosis not present

## 2020-09-10 DIAGNOSIS — E1142 Type 2 diabetes mellitus with diabetic polyneuropathy: Secondary | ICD-10-CM | POA: Diagnosis not present

## 2020-09-10 DIAGNOSIS — L02612 Cutaneous abscess of left foot: Secondary | ICD-10-CM | POA: Diagnosis not present

## 2020-09-10 DIAGNOSIS — A419 Sepsis, unspecified organism: Secondary | ICD-10-CM | POA: Diagnosis not present

## 2020-09-10 DIAGNOSIS — M86672 Other chronic osteomyelitis, left ankle and foot: Secondary | ICD-10-CM | POA: Diagnosis not present

## 2020-09-10 HISTORY — PX: OTHER SURGICAL HISTORY: SHX169

## 2020-09-10 MED ORDER — OXYCODONE-ACETAMINOPHEN 5-325 MG PO TABS: 1.0000 | ORAL_TABLET | ORAL | 0 refills | Status: DC | PRN

## 2020-09-10 MED ORDER — CIPROFLOXACIN HCL 250 MG PO TABS
250.0000 mg | ORAL_TABLET | Freq: Two times a day (BID) | ORAL | 0 refills | Status: DC
Start: 2020-09-10 — End: 2020-09-17

## 2020-09-11 ENCOUNTER — Telehealth: Payer: Self-pay | Admitting: Podiatry

## 2020-09-11 DIAGNOSIS — Z7982 Long term (current) use of aspirin: Secondary | ICD-10-CM | POA: Diagnosis not present

## 2020-09-11 DIAGNOSIS — E11628 Type 2 diabetes mellitus with other skin complications: Secondary | ICD-10-CM | POA: Diagnosis not present

## 2020-09-11 DIAGNOSIS — I10 Essential (primary) hypertension: Secondary | ICD-10-CM | POA: Diagnosis not present

## 2020-09-11 DIAGNOSIS — L02612 Cutaneous abscess of left foot: Secondary | ICD-10-CM | POA: Diagnosis not present

## 2020-09-11 DIAGNOSIS — E782 Mixed hyperlipidemia: Secondary | ICD-10-CM | POA: Diagnosis not present

## 2020-09-11 DIAGNOSIS — L03116 Cellulitis of left lower limb: Secondary | ICD-10-CM | POA: Diagnosis not present

## 2020-09-11 NOTE — Telephone Encounter (Signed)
Satira Sark, Home Health Nurse for [facility] called in requesting the orders for patients care. They have current orders for wound care but services have already been requested but care can't start without orders in place.  Field seismologist [phone]

## 2020-09-11 NOTE — Telephone Encounter (Signed)
Pt's wife called stating home health needed an order for dressing changes.  Pt is already under Home health.

## 2020-09-13 NOTE — Progress Notes (Signed)
  Subjective:  Patient ID: James Solo., male    DOB: 1941-06-08,  MRN: [id number]  Chief Complaint  Patient presents with  . Routine Post Op    patient's wife stated that she could not get any of the packing in the left foot     DOS: 07/09/20 Procedure: Tibial Sesamoidectomy left  79 y.o. male presents for folllow up.  States that the wound continues to drain although it is a little less than previously.  Having difficulty packing the wound because it is healing up.  Objective:  Physical Exam: tenderness at the surgical site, local edema noted and calf supple, nontender. Incision: almost healed externally but with deep probing without probe to bone. Probes 12 o clock approx 2 cm  Assessment:   1. Diabetic ulcer of left midfoot associated with type 1 diabetes mellitus, with fat layer exposed ([place])   2. Chronic osteomyelitis involving left ankle and foot (HCC)   3. Cellulitis and abscess of toe of left foot    Plan:  Patient was evaluated and treated and all questions answered.  Post-operative State -Wound continues to drain purulent fluid despite conservative therapy and antibiotics.  Packed with Iodosorb and dressed with DSD today.  Patient's wife to continue doing so at home. -WBAT in surgical shoe.  -Reviewed MRI with patient.  Suggestive of osteomyelitis of the first metatarsal and proximal phalanx.  I think these are amenable to resection.  Discussed that after resection the joint will be unstable thus necessitating a external fixator for short period of time -Patient has failed all conservative therapy and wishes to proceed with surgical intervention. All risks, benefits, and alternatives discussed with patient. No guarantees given. Consent reviewed and signed by patient. -Planned procedures: Left foot incision and drainage, first metatarsal and proximal phalanx resection, insertion of antibiotic spacer, application of multiplanar external fixator -Plan for procedure  later this week.  No follow-ups on file.

## 2020-09-14 DIAGNOSIS — I10 Essential (primary) hypertension: Secondary | ICD-10-CM | POA: Diagnosis not present

## 2020-09-14 DIAGNOSIS — L03116 Cellulitis of left lower limb: Secondary | ICD-10-CM | POA: Diagnosis not present

## 2020-09-14 DIAGNOSIS — E11628 Type 2 diabetes mellitus with other skin complications: Secondary | ICD-10-CM | POA: Diagnosis not present

## 2020-09-14 DIAGNOSIS — E782 Mixed hyperlipidemia: Secondary | ICD-10-CM | POA: Diagnosis not present

## 2020-09-14 DIAGNOSIS — L02612 Cutaneous abscess of left foot: Secondary | ICD-10-CM | POA: Diagnosis not present

## 2020-09-14 DIAGNOSIS — Z7982 Long term (current) use of aspirin: Secondary | ICD-10-CM | POA: Diagnosis not present

## 2020-09-17 ENCOUNTER — Ambulatory Visit (INDEPENDENT_AMBULATORY_CARE_PROVIDER_SITE_OTHER): Payer: Medicare Other

## 2020-09-17 ENCOUNTER — Ambulatory Visit (INDEPENDENT_AMBULATORY_CARE_PROVIDER_SITE_OTHER): Payer: Medicare Other | Admitting: Podiatry

## 2020-09-17 ENCOUNTER — Encounter: Payer: Self-pay | Admitting: Podiatry

## 2020-09-17 ENCOUNTER — Other Ambulatory Visit: Payer: Self-pay

## 2020-09-17 DIAGNOSIS — M86672 Other chronic osteomyelitis, left ankle and foot: Secondary | ICD-10-CM | POA: Diagnosis not present

## 2020-09-17 DIAGNOSIS — Z4789 Encounter for other orthopedic aftercare: Secondary | ICD-10-CM | POA: Diagnosis not present

## 2020-09-17 DIAGNOSIS — L97422 Non-pressure chronic ulcer of left heel and midfoot with fat layer exposed: Secondary | ICD-10-CM

## 2020-09-17 DIAGNOSIS — E10621 Type 1 diabetes mellitus with foot ulcer: Secondary | ICD-10-CM | POA: Diagnosis not present

## 2020-09-17 MED ORDER — CEFDINIR 300 MG PO CAPS: 300.0000 mg | ORAL_CAPSULE | Freq: Two times a day (BID) | ORAL | 0 refills | Status: DC

## 2020-09-17 MED ORDER — CIPROFLOXACIN HCL 500 MG PO TABS
500.0000 mg | ORAL_TABLET | Freq: Two times a day (BID) | ORAL | 0 refills | Status: DC
Start: 2020-09-17 — End: 2022-02-14

## 2020-09-17 NOTE — Progress Notes (Signed)
  Subjective:  Patient ID: James Solo., male    DOB: 23-May-1941,  MRN: [id number]  Chief Complaint  Patient presents with  . Routine Post Op    i am doing ok on the left foot and the pin i thought i would have trouble sleeping but i have not     DOS: 07/09/20 Procedure: Tibial Sesamoidectomy left  DOS: 09/10/20 Procedure: Left 1st metatarsal and phalangeal resection, insertion of abx spacer, application of external fixator  79 y.o. male presents for folllow up. States he is not having problems with the fixator and is doing ok. Pt's wife is packing the wound daily.  Objective:  Physical Exam: no tenderness at the surgical site, local edema noted, calf supple, nontender and good 1st ray alignment. Incision: incision dorsal foot healing with intact suture/staple material. Proximal pin sites with skin irritation but no warmth concerning for infection. These pin sites appear stable. The distal pin site is loose, with free movement of the hallux in the pin site. There is serosanguinous drainage at this site. Plantar foot wound with maceration, serosanguinous drainage with slight purulence.  Assessment:   1. Diabetic ulcer of left midfoot associated with type 1 diabetes mellitus, with fat layer exposed ([place])   2. Osteomyelitis, chronic, ankle or foot, left ([place])   3. Aftercare involving removal of external fixation device    Plan:  Patient was evaluated and treated and all questions answered.  Post-operative State -XR reviewed with patient -The pin in the distal phalanx is loose. There is no evidence of fracture on XR. Discussed that due to this the patient would benefit from removal. Given the time that it would take to set patient up for removal in the OR, it was discussed with patient that he would benefit from removal today in the office. Patient agreed to proceed. The pinsites were then anesthetized with 5 cc lidocaine 1% plain. The pin sites were prepped with betadine. A wrench  was used to sequentially remove the brackets around the pins, and the fixator was removed. The pins were then gently backed out with a hemostat. All fixation was removed. The pin sites were dressed with povidone and band-aid. -Cultures reviewed. Growing resistance to fluoroquinolones. Will continue cipro and refer to ID for further eval.  No follow-ups on file.

## 2020-09-18 ENCOUNTER — Encounter: Payer: Self-pay | Admitting: Podiatry

## 2020-09-21 ENCOUNTER — Encounter: Payer: Self-pay | Admitting: Podiatry

## 2020-09-21 ENCOUNTER — Ambulatory Visit (INDEPENDENT_AMBULATORY_CARE_PROVIDER_SITE_OTHER): Payer: Medicare Other | Admitting: Podiatry

## 2020-09-21 ENCOUNTER — Other Ambulatory Visit: Payer: Self-pay

## 2020-09-21 DIAGNOSIS — L97422 Non-pressure chronic ulcer of left heel and midfoot with fat layer exposed: Secondary | ICD-10-CM

## 2020-09-21 DIAGNOSIS — L02612 Cutaneous abscess of left foot: Secondary | ICD-10-CM

## 2020-09-21 DIAGNOSIS — Z9889 Other specified postprocedural states: Secondary | ICD-10-CM

## 2020-09-21 DIAGNOSIS — L03032 Cellulitis of left toe: Secondary | ICD-10-CM

## 2020-09-21 DIAGNOSIS — M86672 Other chronic osteomyelitis, left ankle and foot: Secondary | ICD-10-CM

## 2020-09-21 DIAGNOSIS — E10621 Type 1 diabetes mellitus with foot ulcer: Secondary | ICD-10-CM

## 2020-09-21 NOTE — Progress Notes (Signed)
  Subjective:  Patient ID: James Solo., male    DOB: [birth year],  MRN: [id number]  Chief Complaint  Patient presents with  . Routine Post Op    it looks better on the top and the bottom stays wet and i thought it did not look good when i left thursday but when my wife looked at it friday it was doing better   DOS: 07/09/20 Procedure: Tibial Sesamoidectomy left  DOS: 09/10/20 Procedure: Left 1st metatarsal and phalangeal resection, insertion of abx spacer, application of external fixator  DOS: 09/18/20 Procedure: Removal of external fixator under local anesthesia  79 y.o. male presents for folllow up. Underwent removal of the external fixator Thursday. Otherwise doing well, thinks the foot is looking better than it was on Thursday.  Objective:  Physical Exam: tenderness at the surgical site, local edema noted and calf supple, nontender. Incision: healed dorsally with intact suture/staple.Pin sites healing well. Plantar wound macerated with sanguinopurulent drainage.  Assessment:   1. Diabetic ulcer of left midfoot associated with type 1 diabetes mellitus, with fat layer exposed ([place])   2. Chronic osteomyelitis involving left ankle and foot (HCC)   3. Cellulitis and abscess of toe of left foot   4. Post-operative state    Plan:  Patient was evaluated and treated and all questions answered.  Post-operative State -Wound cleansed and repacked with iodoform gauze packing, DSD. -Cultures with growing resistance to fluoroquinolones. Refill cipro. Has appt for ID tomorrow. Will likely need extended IV abx for this infection. He has a good chance at successful salvage, with good home support and good healing potential. -His toe does appear more dorsiflexed now that the external fixator was removed. Continue CAM Boot. Should the joint be more unstable will consider options in the future once infection resolves. -F/u in 1 week for recheck.  No follow-ups on file.

## 2020-09-22 ENCOUNTER — Ambulatory Visit (INDEPENDENT_AMBULATORY_CARE_PROVIDER_SITE_OTHER): Payer: Medicare Other | Admitting: Internal Medicine

## 2020-09-22 ENCOUNTER — Encounter: Payer: Self-pay | Admitting: Internal Medicine

## 2020-09-22 VITALS — BP 174/72 | HR 81 | Temp 98.0°F | Wt 176.0 lb

## 2020-09-22 DIAGNOSIS — L97526 Non-pressure chronic ulcer of other part of left foot with bone involvement without evidence of necrosis: Secondary | ICD-10-CM | POA: Diagnosis not present

## 2020-09-22 DIAGNOSIS — Z794 Long term (current) use of insulin: Secondary | ICD-10-CM | POA: Diagnosis not present

## 2020-09-22 DIAGNOSIS — M869 Osteomyelitis, unspecified: Secondary | ICD-10-CM | POA: Diagnosis not present

## 2020-09-22 DIAGNOSIS — E11621 Type 2 diabetes mellitus with foot ulcer: Secondary | ICD-10-CM | POA: Diagnosis not present

## 2020-09-22 DIAGNOSIS — I10 Essential (primary) hypertension: Secondary | ICD-10-CM | POA: Diagnosis not present

## 2020-09-22 DIAGNOSIS — L97529 Non-pressure chronic ulcer of other part of left foot with unspecified severity: Secondary | ICD-10-CM | POA: Insufficient documentation

## 2020-09-22 DIAGNOSIS — E1169 Type 2 diabetes mellitus with other specified complication: Secondary | ICD-10-CM | POA: Diagnosis not present

## 2020-09-22 NOTE — Patient Instructions (Signed)
Thank you for coming to see me today. It was a pleasure seeing you.  Once I have your lab results from today and have reviewed them, our office will contact you regarding scheduling a PICC line to be placed and we will plan to start an IV antibiotic for several weeks to hopefully eradicate this infection.  If you have any questions or concerns, please do not hesitate to call the office at [phone].  Take Care,   Jule Ser, DO

## 2020-09-22 NOTE — Assessment & Plan Note (Signed)
Pt reports most recent A1c over 7 and currently requiring long term insulin therapy and oral medications.  Plan: -- check A1c today to assess his glycemic control -- continued PCP follow up

## 2020-09-22 NOTE — Progress Notes (Signed)
Spoke with James Norton in [place] Patient scheduled for November 12th at 11am Antibiotics to be determined once labs. Patient will need first dosing at short stay.  Referral faxed to [facility].  James Norton

## 2020-09-22 NOTE — Assessment & Plan Note (Signed)
Patient with chronic left foot ulcer and recent path from surgery showing osteomyelitis and cultures with Pseudomonas aeruginosa.  Currently on oral Ciprofloxacin and tolerating this well, however, recent cultures with only intermediate susceptibility.  Hopefully will be able to treat him through this bone infection and heal this ulcer.  Will require multifaceted approach to antibiotics, wound care, off loading, and glycemic control.   Plan: -- check CBC, CMP, baseline inflammatory markers today -- check A1c -- PICC line order placed -- once labs received and reviewed will finalize dosing for IV antibiotic plan.  Likely Cefepime x 6 weeks -- continue ciprofloxacin until able to get IV therapy arranged -- continue follow up with podiatry -- RTC 4 weeks

## 2020-09-22 NOTE — Assessment & Plan Note (Signed)
BP very elevated today, however, patient without symptoms.  He is currently managed on 2 drug anti-hypertensive regimen.  Plan: -- continue to follow closely with PCP regarding HTN management.

## 2020-09-22 NOTE — Progress Notes (Signed)
[facility]  Reason for Consult: diabetic foot ulcer, osteo  Referring Provider: Dr James Norton  HPI:  James Norton. is a 79 y.o. male who presents to clinic for further evaluation of diabetic foot ulcer and osteomyelitis.     James Norton has been dealing with a left diabetic foot ulcer and has been on ciprofloxacin for several weeks according to him and his wife (she helps provide his wound care).  He follows with Dr James Norton of the podiatry service and saw him most recently on 09/21/20.  On 07/09/20 he underwent tibial sesamoidectomy on the left followed by a left 1st metatarsal and phalangeal resection on 09/10/20.  Cultures from this most recent surgery are noted below.  Of note, this PsA is R to Levaquin and Intermediate to Cipro.  The only susceptible options are IV therapy.  Patient currently is doing well without fevers or chills or other symptoms of systemic illness.  He reports having been told previously that he has good blood flow to allow for wound healing.  He has no abx allergies that he is aware of.   Micro from 10/28 bone cultures = moderate PsA Path from 10/28 = osteomyelitis from left foot 1st metatarsal  Patient's Medications  New Prescriptions   No medications on file  Previous Medications   AMLODIPINE (NORVASC) 5 MG TABLET       ATORVASTATIN (LIPITOR) 80 MG TABLET       CIPROFLOXACIN (CIPRO) 500 MG TABLET    Take 1 tablet (500 mg total) by mouth 2 (two) times daily.   LEVEMIR FLEXTOUCH 100 UNIT/ML PEN       LISINOPRIL (ZESTRIL) 10 MG TABLET       LISINOPRIL (ZESTRIL) 20 MG TABLET       METFORMIN (GLUCOPHAGE-XR) 500 MG 24 HR TABLET       OXYCODONE-ACETAMINOPHEN (PERCOCET) 5-325 MG TABLET    Take 1 tablet by mouth every 4 (four) hours as needed for severe pain.   SILVER SULFADIAZINE (SILVADENE) 1 % CREAM    Apply pea-sized amount to wound daily.  Modified Medications   No medications on file  Discontinued Medications   No medications  on file      Past Medical History:  Diagnosis Date  . Diabetes ([place])   . Diabetic foot ulcer ([place])   . Hypertension     Social History   Tobacco Use  . Smoking status: Never Smoker  . Smokeless tobacco: Never Used  Substance Use Topics  . Alcohol use: Not on file  . Drug use: Not on file   Past Surgical History:  Procedure Laterality Date  . left metatarsal resection  09/10/2020  . left sesamoidectomy  07/09/2020    No pertinent family hx.  No Known Allergies  Review of Systems: Review of Systems  Constitutional: Negative for chills and fever.  Respiratory: Negative.   Cardiovascular: Negative.   Skin:       Chronic wound on left foot.   All other systems reviewed and are negative.    Objective: Vitals:   09/22/20 0900  BP: (!) 174/72  Pulse: 81  Temp: 98 F (36.7 C)  Weight: 176 lb (79.8 kg)     There is no height or weight on file to calculate BMI.  Physical Exam Constitutional:      General: He is not in acute distress.    Appearance: Normal appearance.  HENT:     Head: Normocephalic and  atraumatic.  Pulmonary:     Effort: Pulmonary effort is normal. No respiratory distress.  Musculoskeletal:     Comments: Left foot with post-surgical scars noted. Base of left foot at 1st MTP joint with small, packed wound with serosanguineous drainage.  No surrounding erythema.  Mild edema in foot.    Neurological:     Mental Status: He is alert.     Assessment and Plan: Osteomyelitis of great toe of left foot ([place]) Patient with chronic left foot ulcer and recent path from surgery showing osteomyelitis and cultures with Pseudomonas aeruginosa.  Currently on oral Ciprofloxacin and tolerating this well, however, recent cultures with only intermediate susceptibility.  Hopefully will be able to treat him through this bone infection and heal this ulcer.  Will require multifaceted approach to antibiotics, wound care, off loading, and glycemic control.   Plan: --  check CBC, CMP, baseline inflammatory markers today -- check A1c -- PICC line order placed -- once labs received and reviewed will finalize dosing for IV antibiotic plan.  Likely Cefepime x 6 weeks -- continue ciprofloxacin until able to get IV therapy arranged -- continue follow up with podiatry -- RTC 4 weeks   Type 2 diabetes mellitus with other specified complication ([place]) Pt reports most recent A1c over 7 and currently requiring long term insulin therapy and oral medications.  Plan: -- check A1c today to assess his glycemic control -- continued PCP follow up   Hypertension BP very elevated today, however, patient without symptoms.  He is currently managed on 2 drug anti-hypertensive regimen.  Plan: -- continue to follow closely with PCP regarding HTN management.     Orders Placed This Encounter  Procedures  . [place] [facility] LEFT >5 YRS INC IMG GUIDE    Standing Status:   Future    Standing Expiration Date:   09/22/2021    Order Specific Question:   Reason for Exam (SYMPTOM  OR DIAGNOSIS REQUIRED)    Answer:   need for long term IV abx    Order Specific Question:   Preferred Imaging Location?    Answer:   [facility]  . CBC w/Diff  . COMPLETE METABOLIC PANEL WITH GFR  . Sedimentation rate  . C-reactive protein  . HgB A1c     [facility] 09/22/2020, 9:43 AM

## 2020-09-23 ENCOUNTER — Telehealth: Payer: Self-pay | Admitting: Internal Medicine

## 2020-09-23 ENCOUNTER — Telehealth: Payer: Self-pay

## 2020-09-23 LAB — C-REACTIVE PROTEIN: CRP: 25.3 mg/L — ABNORMAL HIGH (ref <8.0)

## 2020-09-23 LAB — COMPLETE METABOLIC PANEL WITH GFR
AG Ratio: 1.3 (calc) (ref 1.0–2.5)
ALT: 15 U/L (ref 9–46)
AST: 16 U/L (ref 10–35)
Albumin: 4 g/dL (ref 3.6–5.1)
Alkaline phosphatase (APISO): 110 U/L (ref 35–144)
BUN: 20 mg/dL (ref 7–25)
CO2: 28 mmol/L (ref 20–32)
Calcium: 10.2 mg/dL (ref 8.6–10.3)
Chloride: 101 mmol/L (ref 98–110)
Creat: 0.98 mg/dL (ref 0.70–1.18)
GFR, Est African American: 86 mL/min/{1.73_m2} (ref >=60)
GFR, Est Non African American: 74 mL/min/{1.73_m2} (ref >=60)
Globulin: 3.1 g/dL (calc) (ref 1.9–3.7)
Glucose, Bld: 148 mg/dL — ABNORMAL HIGH (ref 65–99)
Potassium: 5.1 mmol/L (ref 3.5–5.3)
Sodium: 137 mmol/L (ref 135–146)
Total Bilirubin: 0.4 mg/dL (ref 0.2–1.2)
Total Protein: 7.1 g/dL (ref 6.1–8.1)

## 2020-09-23 LAB — CBC WITH DIFFERENTIAL/PLATELET
Absolute Monocytes: 771 cells/uL (ref 200–950)
Basophils Absolute: 75 cells/uL (ref 0–200)
Basophils Relative: 0.8 %
Eosinophils Absolute: 66 cells/uL (ref 15–500)
Eosinophils Relative: 0.7 %
HCT: 39 % (ref 38.5–50.0)
Hemoglobin: 12.8 g/dL — ABNORMAL LOW (ref 13.2–17.1)
Lymphs Abs: 912 cells/uL (ref 850–3900)
MCH: 26.4 pg — ABNORMAL LOW (ref 27.0–33.0)
MCHC: 32.8 g/dL (ref 32.0–36.0)
MCV: 80.4 fL (ref 80.0–100.0)
MPV: 10.3 fL (ref 7.5–12.5)
Monocytes Relative: 8.2 %
Neutro Abs: 7576 cells/uL (ref 1500–7800)
Neutrophils Relative %: 80.6 %
Platelets: 428 10*3/uL — ABNORMAL HIGH (ref 140–400)
RBC: 4.85 10*6/uL (ref 4.20–5.80)
RDW: 14.1 % (ref 11.0–15.0)
Total Lymphocyte: 9.7 %
WBC: 9.4 10*3/uL (ref 3.8–10.8)

## 2020-09-23 LAB — HEMOGLOBIN A1C
Hgb A1c MFr Bld: 7.6 % of total Hgb — ABNORMAL HIGH (ref <5.7)
Mean Plasma Glucose: 171 (calc)
eAG (mmol/L): 9.5 (calc)

## 2020-09-23 LAB — SEDIMENTATION RATE: Sed Rate: 39 mm/h — ABNORMAL HIGH (ref 0–20)

## 2020-09-23 NOTE — Telephone Encounter (Signed)
Patient returned call and updated regarding recent labs and IV therapy for cefepime 2 gm q8h via picc line for 6 weeks.   Patient aware of radiology appointment for 09/25/20. Orders faxed to short stay and [facility].  Eugenia Mcalpine

## 2020-09-23 NOTE — Telephone Encounter (Signed)
Attempted to reach patient at listed contact number [phone] to notify of normal kidney function labs and antibiotic plan that was discussed during yesterday's visit.  After discussing with our clinical pharmacist we will plan for cefepime 2gm q8h via PICC line to be placed this Friday 11/12 and first dose to be given at short stay that same day.  Anticipate 6 week course for osteomyelitis due to Pseduomonas aeruginosa with end date 11/05/20.  Have provided orders to our staff to start arranging and asked patient to call back with any questions/concerns.   Raynelle [facility] 09/23/2020, 9:06 AM

## 2020-09-24 ENCOUNTER — Other Ambulatory Visit (HOSPITAL_COMMUNITY): Payer: Self-pay | Admitting: *Deleted

## 2020-09-24 DIAGNOSIS — D519 Vitamin B12 deficiency anemia, unspecified: Secondary | ICD-10-CM | POA: Diagnosis not present

## 2020-09-25 ENCOUNTER — Ambulatory Visit (HOSPITAL_COMMUNITY)
Admission: RE | Admit: 2020-09-25 | Discharge: 2020-09-25 | Disposition: A | Discharge status: Home / Self Care | Payer: Medicare Other | Source: Ambulatory Visit | Attending: Internal Medicine | Admitting: Internal Medicine

## 2020-09-25 ENCOUNTER — Other Ambulatory Visit: Payer: Self-pay

## 2020-09-25 ENCOUNTER — Encounter (HOSPITAL_COMMUNITY)
Admission: RE | Admit: 2020-09-25 | Discharge: 2020-09-25 | Disposition: A | Discharge status: Home / Self Care | Payer: Medicare Other | Source: Ambulatory Visit | Attending: Internal Medicine | Admitting: Internal Medicine

## 2020-09-25 DIAGNOSIS — E11621 Type 2 diabetes mellitus with foot ulcer: Secondary | ICD-10-CM | POA: Diagnosis not present

## 2020-09-25 DIAGNOSIS — L97526 Non-pressure chronic ulcer of other part of left foot with bone involvement without evidence of necrosis: Secondary | ICD-10-CM

## 2020-09-25 DIAGNOSIS — M868X7 Other osteomyelitis, ankle and foot: Secondary | ICD-10-CM | POA: Diagnosis not present

## 2020-09-25 DIAGNOSIS — Z452 Encounter for adjustment and management of vascular access device: Secondary | ICD-10-CM | POA: Diagnosis not present

## 2020-09-25 DIAGNOSIS — M8600 Acute hematogenous osteomyelitis, unspecified site: Secondary | ICD-10-CM | POA: Diagnosis not present

## 2020-09-25 DIAGNOSIS — M869 Osteomyelitis, unspecified: Secondary | ICD-10-CM

## 2020-09-25 MED ORDER — HEPARIN SOD (PORK) LOCK FLUSH 100 UNIT/ML IV SOLN
250.0000 [IU] | INTRAVENOUS | Status: AC | PRN
  Administered 2020-09-25: 250 [IU]

## 2020-09-25 MED ORDER — LIDOCAINE HCL 1 % IJ SOLN
INTRAMUSCULAR | Status: AC
  Filled 2020-09-25: qty 20

## 2020-09-25 MED ORDER — SODIUM CHLORIDE 0.9 % IV SOLN
2.0000 g | Freq: Once | INTRAVENOUS | Status: AC
  Administered 2020-09-25: 2 g via INTRAVENOUS
  Filled 2020-09-25: qty 2

## 2020-09-25 MED ORDER — LIDOCAINE HCL 1 % IJ SOLN
INTRAMUSCULAR | Status: DC | PRN
  Administered 2020-09-25: 10 mL

## 2020-09-25 MED ORDER — HEPARIN SOD (PORK) LOCK FLUSH 100 UNIT/ML IV SOLN
INTRAVENOUS | Status: AC
  Filled 2020-09-25: qty 5

## 2020-09-25 NOTE — Procedures (Signed)
PROCEDURE SUMMARY:  Successful placement of image-guided single lumen PICC line to the left basilic vein. Length 40 cm. Tip at lower SVC/RA. No complications. EBL < 2 mL. Ready for use.  Please see imaging section of Epic for full dictation.   Earley Abide PA-C 09/25/2020 12:31 PM

## 2020-09-28 ENCOUNTER — Encounter: Payer: Self-pay | Admitting: Podiatry

## 2020-09-28 ENCOUNTER — Other Ambulatory Visit: Payer: Self-pay

## 2020-09-28 ENCOUNTER — Ambulatory Visit (INDEPENDENT_AMBULATORY_CARE_PROVIDER_SITE_OTHER): Payer: Medicare Other | Admitting: Podiatry

## 2020-09-28 DIAGNOSIS — L97422 Non-pressure chronic ulcer of left heel and midfoot with fat layer exposed: Secondary | ICD-10-CM

## 2020-09-28 DIAGNOSIS — M86672 Other chronic osteomyelitis, left ankle and foot: Secondary | ICD-10-CM

## 2020-09-28 DIAGNOSIS — E10621 Type 1 diabetes mellitus with foot ulcer: Secondary | ICD-10-CM

## 2020-09-29 DIAGNOSIS — E11621 Type 2 diabetes mellitus with foot ulcer: Secondary | ICD-10-CM | POA: Diagnosis not present

## 2020-09-29 DIAGNOSIS — L97529 Non-pressure chronic ulcer of other part of left foot with unspecified severity: Secondary | ICD-10-CM | POA: Diagnosis not present

## 2020-09-29 DIAGNOSIS — M869 Osteomyelitis, unspecified: Secondary | ICD-10-CM | POA: Diagnosis not present

## 2020-10-06 DIAGNOSIS — Z5181 Encounter for therapeutic drug level monitoring: Secondary | ICD-10-CM | POA: Diagnosis not present

## 2020-10-12 ENCOUNTER — Encounter: Payer: Self-pay | Admitting: Podiatry

## 2020-10-12 ENCOUNTER — Ambulatory Visit (INDEPENDENT_AMBULATORY_CARE_PROVIDER_SITE_OTHER): Payer: Medicare Other | Admitting: Podiatry

## 2020-10-12 ENCOUNTER — Other Ambulatory Visit: Payer: Self-pay

## 2020-10-12 ENCOUNTER — Encounter: Payer: Medicare Other | Admitting: Podiatry

## 2020-10-12 ENCOUNTER — Other Ambulatory Visit: Payer: Self-pay | Admitting: *Deleted

## 2020-10-12 DIAGNOSIS — L97422 Non-pressure chronic ulcer of left heel and midfoot with fat layer exposed: Secondary | ICD-10-CM

## 2020-10-12 DIAGNOSIS — L03032 Cellulitis of left toe: Secondary | ICD-10-CM

## 2020-10-12 DIAGNOSIS — L02612 Cutaneous abscess of left foot: Secondary | ICD-10-CM

## 2020-10-12 DIAGNOSIS — E10621 Type 1 diabetes mellitus with foot ulcer: Secondary | ICD-10-CM

## 2020-10-12 DIAGNOSIS — Z9889 Other specified postprocedural states: Secondary | ICD-10-CM

## 2020-10-12 DIAGNOSIS — M86672 Other chronic osteomyelitis, left ankle and foot: Secondary | ICD-10-CM

## 2020-10-12 NOTE — Progress Notes (Signed)
  Subjective:  Patient ID: James Solo., male    DOB: June 23, 1941,  MRN: [id number]  Chief Complaint  Patient presents with  . Routine Post Op    i still have a lot of draining on the ball of the left foot    DOS: 07/09/20 Procedure: Tibial Sesamoidectomy left  DOS: 09/10/20 Procedure: Left 1st metatarsal and phalangeal resection, insertion of abx spacer, application of external fixator  DOS: 09/18/20 Procedure: Removal of external fixator under local anesthesia  79 y.o. male presents for folllow up. Doing well, thinks the wound is draining less and looks better in color. Doing IV abx without issues.  Objective:  Physical Exam: tenderness at the surgical site, local edema noted and calf supple, nontender. Incision: healed dorsally with intact suture/staple.Pin sites healing well. Plantar wound macerated with sanguinous drainage with only scant purulent tinge - much improved from prior.  Assessment:   1. Diabetic ulcer of left midfoot associated with type 1 diabetes mellitus, with fat layer exposed ([place])   2. Chronic osteomyelitis involving left ankle and foot (HCC)   3. Cellulitis and abscess of toe of left foot   4. Post-operative state    Plan:  Patient was evaluated and treated and all questions answered.  Post-operative State -The surgical sites remain fully healed. Slight DF deformity noted. -Wound cleansed and repacked with iodoform gauze packing, DSD. The wound is improving with less drainage. The wound still probes deep, but the superficial aspect is without hyperkeratosis. Will continue with current plan.  No follow-ups on file.

## 2020-10-13 DIAGNOSIS — M86172 Other acute osteomyelitis, left ankle and foot: Secondary | ICD-10-CM | POA: Diagnosis not present

## 2020-10-13 DIAGNOSIS — Z792 Long term (current) use of antibiotics: Secondary | ICD-10-CM | POA: Diagnosis not present

## 2020-10-13 NOTE — Progress Notes (Signed)
  Subjective:  Patient ID: James Solo., male    DOB: [birth year],  MRN: [id number]  Chief Complaint  Patient presents with  . Routine Post Op    it is looking alot better and not too much draining and the skin is not all white    DOS: 07/09/20 Procedure: Tibial Sesamoidectomy left  DOS: 09/10/20 Procedure: Left 1st metatarsal and phalangeal resection, insertion of abx spacer, application of external fixator  DOS: 09/18/20 Procedure: Removal of external fixator under local anesthesia  79 y.o. male presents for folllow up.  Objective:  Physical Exam: tenderness at the surgical site, local edema noted and calf supple, nontender. Incision: healed dorsally with intact suture/staple.Pin sites healing well. Plantar wound with sanguinous drainage with slight purulent tinge.  Assessment:   1. Diabetic ulcer of left midfoot associated with type 1 diabetes mellitus, with fat layer exposed ([place])   2. Chronic osteomyelitis involving left ankle and foot ([place])    Plan:  Patient was evaluated and treated and all questions answered.  Post-operative State -Wounds improving. Some interval dorsal 1st toe extensus. -Wound cleansed and repacked with iodoform gauze packing, DSD. -Continue IV abx  No follow-ups on file.

## 2020-10-19 ENCOUNTER — Encounter: Payer: Self-pay | Admitting: Podiatry

## 2020-10-19 ENCOUNTER — Other Ambulatory Visit: Payer: Self-pay | Admitting: *Deleted

## 2020-10-19 ENCOUNTER — Ambulatory Visit (INDEPENDENT_AMBULATORY_CARE_PROVIDER_SITE_OTHER): Payer: Medicare Other | Admitting: Podiatry

## 2020-10-19 ENCOUNTER — Other Ambulatory Visit: Payer: Self-pay

## 2020-10-19 DIAGNOSIS — L97422 Non-pressure chronic ulcer of left heel and midfoot with fat layer exposed: Secondary | ICD-10-CM

## 2020-10-19 DIAGNOSIS — E10621 Type 1 diabetes mellitus with foot ulcer: Secondary | ICD-10-CM

## 2020-10-19 NOTE — Progress Notes (Signed)
  Subjective:  Patient ID: James Solo., male    DOB: 1941/09/05,  MRN: [id number]  Chief Complaint  Patient presents with  . Routine Post Op    the left foot is still draining and i would have thought that it would have stopped draining by now    DOS: 07/09/20 Procedure: Tibial Sesamoidectomy left  DOS: 09/10/20 Procedure: Left 1st metatarsal and phalangeal resection, insertion of abx spacer, application of external fixator  DOS: 09/18/20 Procedure: Removal of external fixator under local anesthesia  79 y.o. male presents for folllow up. States the wound is draining more recently.  Objective:  Physical Exam: tenderness at the surgical site, local edema noted and calf supple, nontender. Incision: healed dorsally. Pin sites healing well. Plantar wound macerated with sanguinous drainage with mild purulence today. Foot without warmth, erythema, signs of infection.  Assessment:   1. Diabetic ulcer of left midfoot associated with type 1 diabetes mellitus, with fat layer exposed ([place])    Plan:  Patient was evaluated and treated and all questions answered.  Post-operative State -The surgical sites remain fully healed. Slight DF deformity noted. -The wound has more drainage today than previous. The foot is not cellulitic. Concern for growing resistance to abx or new organism. New culture taken. Continue current regimen for now. -Packed with maxorb today. Switch to aquacell packing at home. -Will discuss with ID Dr. Juleen China once cultures return.  No follow-ups on file.

## 2020-10-20 DIAGNOSIS — Z5181 Encounter for therapeutic drug level monitoring: Secondary | ICD-10-CM | POA: Diagnosis not present

## 2020-10-21 ENCOUNTER — Telehealth: Payer: Self-pay | Admitting: Podiatry

## 2020-10-21 DIAGNOSIS — Z79899 Other long term (current) drug therapy: Secondary | ICD-10-CM | POA: Diagnosis not present

## 2020-10-21 DIAGNOSIS — L02612 Cutaneous abscess of left foot: Secondary | ICD-10-CM | POA: Diagnosis not present

## 2020-10-21 DIAGNOSIS — E782 Mixed hyperlipidemia: Secondary | ICD-10-CM | POA: Diagnosis not present

## 2020-10-21 DIAGNOSIS — E11628 Type 2 diabetes mellitus with other skin complications: Secondary | ICD-10-CM | POA: Diagnosis not present

## 2020-10-21 DIAGNOSIS — I10 Essential (primary) hypertension: Secondary | ICD-10-CM | POA: Diagnosis not present

## 2020-10-21 DIAGNOSIS — Z7982 Long term (current) use of aspirin: Secondary | ICD-10-CM | POA: Diagnosis not present

## 2020-10-21 DIAGNOSIS — Z794 Long term (current) use of insulin: Secondary | ICD-10-CM | POA: Diagnosis not present

## 2020-10-21 DIAGNOSIS — L03116 Cellulitis of left lower limb: Secondary | ICD-10-CM | POA: Diagnosis not present

## 2020-10-21 NOTE — Telephone Encounter (Signed)
Pt's wife called wanting to know if culture results were back yet because [facility] is wanting to know if orders are staying the same for current antibiotic or if it will change (they need to order medicine).  Pls Advise.

## 2020-10-21 NOTE — Telephone Encounter (Signed)
Pt.notified

## 2020-10-21 NOTE — Telephone Encounter (Signed)
Can you let him know they are still pending. Once I get the results I will make sure they know

## 2020-10-25 LAB — WOUND CULTURE

## 2020-10-26 ENCOUNTER — Ambulatory Visit (INDEPENDENT_AMBULATORY_CARE_PROVIDER_SITE_OTHER): Payer: Medicare Other | Admitting: Podiatry

## 2020-10-26 ENCOUNTER — Other Ambulatory Visit: Payer: Self-pay

## 2020-10-26 DIAGNOSIS — L02612 Cutaneous abscess of left foot: Secondary | ICD-10-CM

## 2020-10-26 DIAGNOSIS — E10621 Type 1 diabetes mellitus with foot ulcer: Secondary | ICD-10-CM

## 2020-10-26 DIAGNOSIS — M86672 Other chronic osteomyelitis, left ankle and foot: Secondary | ICD-10-CM

## 2020-10-26 DIAGNOSIS — L03032 Cellulitis of left toe: Secondary | ICD-10-CM

## 2020-10-26 DIAGNOSIS — M86472 Chronic osteomyelitis with draining sinus, left ankle and foot: Secondary | ICD-10-CM

## 2020-10-26 DIAGNOSIS — L97422 Non-pressure chronic ulcer of left heel and midfoot with fat layer exposed: Secondary | ICD-10-CM

## 2020-10-26 NOTE — Progress Notes (Addendum)
  Subjective:  Patient ID: James Norton., male    DOB: 1941/05/03,  MRN: [id number]  Chief Complaint  Patient presents with  . Wound Check    F/U Lt 1st sub met: Pt states," it's about the same,: -pt dneis any change pt denies any pain Tx: packing, picc line and boot   DOS: 07/09/20 Procedure: Tibial Sesamoidectomy left  DOS: 09/10/20 Procedure: Left 1st metatarsal and phalangeal resection, insertion of abx spacer, application of external fixator  DOS: 09/18/20 Procedure: Removal of external fixator under local anesthesia  79 y.o. male presents for folllow up. States the wound continues to drain.  Objective:  Physical Exam: tenderness at the surgical site, local edema noted and calf supple, nontender. Incision: healed dorsally. Pin sites healing well. Plantar wound macerated with sanguinous drainage with mild purulence today. Foot without warmth, erythema, signs of infection.  Assessment:   1. Diabetic ulcer of left midfoot associated with type 1 diabetes mellitus, with fat layer exposed ([place])   2. Chronic osteomyelitis involving left ankle and foot (HCC)   3. Cellulitis and abscess of toe of left foot    Plan:  Patient was evaluated and treated and all questions answered.  Post-operative State -The surgical sites remains fully healed. Slight DF deformity noted. -The wound continues to drain more, the culture did have organisms initially but no final growth. I think he would benefit from repeat debridement, new cultures, and extending his abx. Will plan for procedure next week. -Order MRI for eval for prior to surgery.  No follow-ups on file.

## 2020-10-26 NOTE — Addendum Note (Signed)
Addended by: Hardie Pulley on: 10/26/2020 11:34 AM   Modules accepted: Orders

## 2020-10-27 DIAGNOSIS — L03116 Cellulitis of left lower limb: Secondary | ICD-10-CM | POA: Diagnosis not present

## 2020-10-27 DIAGNOSIS — I10 Essential (primary) hypertension: Secondary | ICD-10-CM | POA: Diagnosis not present

## 2020-10-27 DIAGNOSIS — Z5181 Encounter for therapeutic drug level monitoring: Secondary | ICD-10-CM | POA: Diagnosis not present

## 2020-10-27 DIAGNOSIS — E11628 Type 2 diabetes mellitus with other skin complications: Secondary | ICD-10-CM | POA: Diagnosis not present

## 2020-10-27 DIAGNOSIS — L02612 Cutaneous abscess of left foot: Secondary | ICD-10-CM | POA: Diagnosis not present

## 2020-10-27 DIAGNOSIS — Z7982 Long term (current) use of aspirin: Secondary | ICD-10-CM | POA: Diagnosis not present

## 2020-10-27 DIAGNOSIS — E782 Mixed hyperlipidemia: Secondary | ICD-10-CM | POA: Diagnosis not present

## 2020-10-29 ENCOUNTER — Telehealth: Payer: Self-pay | Admitting: Podiatry

## 2020-10-29 DIAGNOSIS — D519 Vitamin B12 deficiency anemia, unspecified: Secondary | ICD-10-CM | POA: Diagnosis not present

## 2020-10-29 NOTE — Telephone Encounter (Signed)
Can you take care of this?

## 2020-10-29 NOTE — Telephone Encounter (Signed)
Patient has order in for MRI, needs order to be faxed in to [phone], Please Advise

## 2020-10-30 ENCOUNTER — Telehealth: Payer: Self-pay | Admitting: *Deleted

## 2020-10-30 DIAGNOSIS — M6258 Muscle wasting and atrophy, not elsewhere classified, other site: Secondary | ICD-10-CM | POA: Diagnosis not present

## 2020-10-30 DIAGNOSIS — M86172 Other acute osteomyelitis, left ankle and foot: Secondary | ICD-10-CM | POA: Diagnosis not present

## 2020-10-30 DIAGNOSIS — S91302A Unspecified open wound, left foot, initial encounter: Secondary | ICD-10-CM | POA: Diagnosis not present

## 2020-10-30 DIAGNOSIS — Z1152 Encounter for screening for COVID-19: Secondary | ICD-10-CM | POA: Diagnosis not present

## 2020-10-30 DIAGNOSIS — R600 Localized edema: Secondary | ICD-10-CM | POA: Diagnosis not present

## 2020-10-30 NOTE — Telephone Encounter (Signed)
Called and spoke with liz at [facility] on Wednesday 12/15/2021and got the patient an appointment on 12/17/2021for arrival time 2:45 pm for a 3:00 pm appointment. Lattie Haw

## 2020-10-30 NOTE — Telephone Encounter (Signed)
-----   Message from Evelina Bucy, DPM sent at 10/26/2020 11:34 AM EST ----- Can we send MRI to [facility]? No pre-cert needed bc he's medicare. He will need it this week. I decided to do it after he left so can we call him to inform him as well?

## 2020-11-02 ENCOUNTER — Ambulatory Visit (INDEPENDENT_AMBULATORY_CARE_PROVIDER_SITE_OTHER): Payer: Medicare Other | Admitting: Internal Medicine

## 2020-11-02 ENCOUNTER — Telehealth: Payer: Self-pay

## 2020-11-02 ENCOUNTER — Encounter: Payer: Self-pay | Admitting: Internal Medicine

## 2020-11-02 ENCOUNTER — Other Ambulatory Visit: Payer: Self-pay

## 2020-11-02 DIAGNOSIS — M869 Osteomyelitis, unspecified: Secondary | ICD-10-CM

## 2020-11-02 DIAGNOSIS — E1169 Type 2 diabetes mellitus with other specified complication: Secondary | ICD-10-CM

## 2020-11-02 DIAGNOSIS — Z794 Long term (current) use of insulin: Secondary | ICD-10-CM

## 2020-11-02 NOTE — Telephone Encounter (Signed)
Per MD called [facility] with orders to extend patient's IV antibiotics by two weeks. Spoke with Octavia Bruckner who was able to take orders. Orders were repeated and confirmed before ending call. [place]

## 2020-11-02 NOTE — Assessment & Plan Note (Signed)
Hemoglobin A1c is 7.6 when checked last month.   Plan: -- continued management per PCP

## 2020-11-02 NOTE — Progress Notes (Signed)
[facility]ious [facility]  Chief Complaint:  Follow up DFU, osteomyelitis  Subjective: James Norton. is a 79 y.o. male with PMHx as below who presents to the clinic for follow up of diabetic foot ulcer and osteomyelitis.   Please see A&P for the details of today's visit and status of the patient's medical problems.   Patient's Medications  New Prescriptions   No medications on file  Previous Medications   AMLODIPINE (NORVASC) 5 MG TABLET       ATORVASTATIN (LIPITOR) 80 MG TABLET       CEFEPIME (MAXIPIME) 2 G INJECTION       CIPROFLOXACIN (CIPRO) 500 MG TABLET    Take 1 tablet (500 mg total) by mouth 2 (two) times daily.   IBUPROFEN (ADVIL) 800 MG TABLET    Take 800 mg by mouth 3 (three) times daily.   LEVEMIR FLEXTOUCH 100 UNIT/ML PEN       LISINOPRIL (ZESTRIL) 10 MG TABLET       LISINOPRIL (ZESTRIL) 20 MG TABLET       METFORMIN (GLUCOPHAGE-XR) 500 MG 24 HR TABLET       OXYCODONE-ACETAMINOPHEN (PERCOCET) 5-325 MG TABLET    Take 1 tablet by mouth every 4 (four) hours as needed for severe pain.   SILVER SULFADIAZINE (SILVADENE) 1 % CREAM    Apply pea-sized amount to wound daily.  Modified Medications   No medications on file  Discontinued Medications   No medications on file     Past Medical History:  Diagnosis Date  . Diabetes ([place])   . Diabetic foot ulcer ([place])   . Hypertension     No family history on file.  Social History   Socioeconomic History  . Marital status: Married    Spouse name: Not on file  . Number of children: Not on file  . Years of education: Not on file  . Highest education level: Not on file  Occupational History  . Not on file  Tobacco Use  . Smoking status: Never Smoker  . Smokeless tobacco: Never Used  Substance and Sexual Activity  . Alcohol use: Not on file  . Drug use: Not on file  . Sexual activity: Not on file  Other Topics Concern  . Not on file  Social History Narrative  . Not on file   Social  Determinants of Health   Financial Resource Strain: Not on file  Food Insecurity: Not on file  Transportation Needs: Not on file  Physical Activity: Not on file  Stress: Not on file  Social Connections: Not on file  Intimate Partner Violence: Not on file    No Known Allergies   Review of Systems: Review of Systems  Constitutional: Negative for chills and fever.  Respiratory: Negative.   Cardiovascular: Negative.   Musculoskeletal:       + Drainage from foot wound  Skin: Negative.     Objective: Vitals:   11/02/20 0939  BP: (!) 172/82  Pulse: 64  Temp: 98.2 F (36.8 C)  TempSrc: Oral  Weight: 176 lb 9.6 oz (80.1 kg)   There is no height or weight on file to calculate BMI.  Physical Exam Constitutional:      General: He is not in acute distress.    Appearance: Normal appearance.  HENT:     Head: Normocephalic and atraumatic.  Pulmonary:     Effort: Pulmonary effort is normal. No respiratory distress.  Neurological:  General: No focal deficit present.     Mental Status: He is alert and oriented to person, place, and time.  Psychiatric:        Mood and Affect: Mood normal.        Behavior: Behavior normal.         Pertinent Labs and Microbiology CBC Latest Ref Rng & Units 09/22/2020  WBC 3.8 - 10.8 Thousand/uL 9.4  Hemoglobin 13.2 - 17.1 g/dL 12.8(L)  Hematocrit 38.5 - 50.0 % 39.0  Platelets 140 - 400 Thousand/uL 428(H)   CMP Latest Ref Rng & Units 09/22/2020  Glucose 65 - 99 mg/dL 148(H)  BUN 7 - 25 mg/dL 20  Creatinine 0.70 - 1.18 mg/dL 0.98  Sodium 135 - 146 mmol/L 137  Potassium 3.5 - 5.3 mmol/L 5.1  Chloride 98 - 110 mmol/L 101  CO2 20 - 32 mmol/L 28  Calcium 8.6 - 10.3 mg/dL 10.2  Total Protein 6.1 - 8.1 g/dL 7.1  Total Bilirubin 0.2 - 1.2 mg/dL 0.4  AST 10 - 35 U/L 16  ALT 9 - 46 U/L 15     No results found for this or any previous visit (from the past 240 hour(s)).   Assessment and Plan: Type 2 diabetes mellitus with other  specified complication (HCC) Hemoglobin A1c is 7.6 when checked last month.   Plan: -- continued management per PCP   Osteomyelitis of great toe of left foot [facility]) Patient with chronic left foot ulcer and surgery with Dr March Rummage in November 2021 where cultures grew PsA and path was c/w osteomyelitis.  Had PICC placed and started on cefepime 11/12 for anticipated 6 weeks.  Has been following with podiatry and having increased drainage.  Cultured 12/6 which was no growth.  Podiatry taking back to the OR tomorrow 12/21 for repeat debridement and cultures.  Also had repeat MRI on Friday (images not available as done at [place]).  Today, he reports drainage seems to be improving.  Not having pain in his foot currently.  Reports the cost of Cefepime is high but he is going to call his insurance company to see about helping with cost.  He reports no issues with his PICC line.   Plan: -- monitor inflammatory markers (ESR = 25, CRP = 10.3 on 12/7.  Previously 39 and 25 prior to cefepime on 11/9) -- will extend cefepime x 2 weeks pending repeat debridement and cultures.  New end date = 11/20/19.  Beyond 8 weeks of therapy, pending his repeat cultures, I feel that any further antibiotics are unlikely to provide further benefit -- asked patient to follow up with [place] after discussing with insurance company to see if we can assist any with cost -- continue OPAT labs while on cefepime -- wound care, glycemic control -- RTC 4 weeks.   No orders of the defined types were placed in this encounter.     Raynelle Highland for Infectious Disease [facility] 11/02/2020, 10:13 AM  I spent 35 minutes dedicated to the care of this patient on the date of this encounter to include previsit review of records, face-to-face time with the patient discussing diabetic foot infection, osteo, and diabetes, and post visit ordering of testing.

## 2020-11-02 NOTE — Patient Instructions (Signed)
Thank you for coming to see me today. It was a pleasure seeing you.  We are going to extend your cefepime and lab monitoring for 2 weeks.  If you have any questions or concerns, please do not hesitate to call the office at [phone].  Take Care,   Jule Ser, DO

## 2020-11-02 NOTE — Assessment & Plan Note (Addendum)
Patient with chronic left foot ulcer and surgery with Dr March Rummage in November 2021 where cultures grew PsA and path was c/w osteomyelitis.  Had PICC placed and started on cefepime 11/12 for anticipated 6 weeks.  Has been following with podiatry and having increased drainage.  Cultured 12/6 which was no growth.  Podiatry taking back to the OR tomorrow 12/21 for repeat debridement and cultures.  Also had repeat MRI on Friday (images not available as done at [place]).  Today, he reports drainage seems to be improving.  Not having pain in his foot currently.  Reports the cost of Cefepime is high but he is going to call his insurance company to see about helping with cost.  He reports no issues with his PICC line.   Plan: -- monitor inflammatory markers (ESR = 25, CRP = 10.3 on 12/7.  Previously 39 and 25 prior to cefepime on 11/9) -- will extend cefepime x 2 weeks pending repeat debridement and cultures.  New end date = 11/20/19.  Beyond 8 weeks of therapy, pending his repeat cultures, I feel that any further antibiotics are unlikely to provide further benefit -- asked patient to follow up with [place] after discussing with insurance company to see if we can assist any with cost -- continue OPAT labs while on cefepime -- wound care, glycemic control -- RTC 4 weeks.

## 2020-11-03 ENCOUNTER — Encounter: Payer: Self-pay | Admitting: Podiatry

## 2020-11-03 DIAGNOSIS — I10 Essential (primary) hypertension: Secondary | ICD-10-CM | POA: Diagnosis not present

## 2020-11-03 DIAGNOSIS — E11621 Type 2 diabetes mellitus with foot ulcer: Secondary | ICD-10-CM | POA: Diagnosis not present

## 2020-11-03 DIAGNOSIS — M86672 Other chronic osteomyelitis, left ankle and foot: Secondary | ICD-10-CM | POA: Diagnosis not present

## 2020-11-03 DIAGNOSIS — L97529 Non-pressure chronic ulcer of other part of left foot with unspecified severity: Secondary | ICD-10-CM | POA: Diagnosis not present

## 2020-11-03 DIAGNOSIS — L97422 Non-pressure chronic ulcer of left heel and midfoot with fat layer exposed: Secondary | ICD-10-CM | POA: Diagnosis not present

## 2020-11-03 DIAGNOSIS — Z79891 Long term (current) use of opiate analgesic: Secondary | ICD-10-CM | POA: Diagnosis not present

## 2020-11-03 DIAGNOSIS — L97528 Non-pressure chronic ulcer of other part of left foot with other specified severity: Secondary | ICD-10-CM | POA: Diagnosis not present

## 2020-11-03 DIAGNOSIS — E1142 Type 2 diabetes mellitus with diabetic polyneuropathy: Secondary | ICD-10-CM | POA: Diagnosis not present

## 2020-11-03 DIAGNOSIS — M869 Osteomyelitis, unspecified: Secondary | ICD-10-CM | POA: Diagnosis not present

## 2020-11-03 DIAGNOSIS — E785 Hyperlipidemia, unspecified: Secondary | ICD-10-CM | POA: Diagnosis not present

## 2020-11-03 DIAGNOSIS — E1169 Type 2 diabetes mellitus with other specified complication: Secondary | ICD-10-CM | POA: Diagnosis not present

## 2020-11-03 DIAGNOSIS — Z792 Long term (current) use of antibiotics: Secondary | ICD-10-CM | POA: Diagnosis not present

## 2020-11-03 DIAGNOSIS — Z79899 Other long term (current) drug therapy: Secondary | ICD-10-CM | POA: Diagnosis not present

## 2020-11-04 ENCOUNTER — Telehealth: Payer: Self-pay

## 2020-11-04 DIAGNOSIS — L03116 Cellulitis of left lower limb: Secondary | ICD-10-CM | POA: Diagnosis not present

## 2020-11-04 DIAGNOSIS — Z7982 Long term (current) use of aspirin: Secondary | ICD-10-CM | POA: Diagnosis not present

## 2020-11-04 DIAGNOSIS — E11628 Type 2 diabetes mellitus with other skin complications: Secondary | ICD-10-CM | POA: Diagnosis not present

## 2020-11-04 DIAGNOSIS — I10 Essential (primary) hypertension: Secondary | ICD-10-CM | POA: Diagnosis not present

## 2020-11-04 DIAGNOSIS — E782 Mixed hyperlipidemia: Secondary | ICD-10-CM | POA: Diagnosis not present

## 2020-11-04 DIAGNOSIS — L02612 Cutaneous abscess of left foot: Secondary | ICD-10-CM | POA: Diagnosis not present

## 2020-11-04 DIAGNOSIS — Z5181 Encounter for therapeutic drug level monitoring: Secondary | ICD-10-CM | POA: Diagnosis not present

## 2020-11-04 NOTE — Telephone Encounter (Signed)
Patient called asking about financial assistance for IV antibiotics. I have advised the patient that the financial assistance will be done through [facility]. I have also reached out to [facility] with [facility] and she stated she would work on this next week for the patient. Patient has also been advised. Atiyana Welte T Brooks Sailors

## 2020-11-09 ENCOUNTER — Other Ambulatory Visit: Payer: Self-pay

## 2020-11-09 ENCOUNTER — Ambulatory Visit (INDEPENDENT_AMBULATORY_CARE_PROVIDER_SITE_OTHER): Payer: Medicare Other | Admitting: Podiatry

## 2020-11-09 ENCOUNTER — Ambulatory Visit (INDEPENDENT_AMBULATORY_CARE_PROVIDER_SITE_OTHER): Payer: Medicare Other

## 2020-11-09 DIAGNOSIS — E10621 Type 1 diabetes mellitus with foot ulcer: Secondary | ICD-10-CM

## 2020-11-09 DIAGNOSIS — E785 Hyperlipidemia, unspecified: Secondary | ICD-10-CM | POA: Diagnosis not present

## 2020-11-09 DIAGNOSIS — L97422 Non-pressure chronic ulcer of left heel and midfoot with fat layer exposed: Secondary | ICD-10-CM

## 2020-11-09 DIAGNOSIS — E1142 Type 2 diabetes mellitus with diabetic polyneuropathy: Secondary | ICD-10-CM | POA: Diagnosis not present

## 2020-11-09 DIAGNOSIS — D509 Iron deficiency anemia, unspecified: Secondary | ICD-10-CM | POA: Diagnosis not present

## 2020-11-09 DIAGNOSIS — D519 Vitamin B12 deficiency anemia, unspecified: Secondary | ICD-10-CM | POA: Diagnosis not present

## 2020-11-09 NOTE — Progress Notes (Signed)
  Subjective:  Patient ID: James Solo., male    DOB: [birth year],  MRN: [id number]  No chief complaint on file.  DOS: 07/09/20 Procedure: Tibial Sesamoidectomy left  DOS: 09/10/20 Procedure: Left 1st metatarsal and phalangeal resection, insertion of abx spacer, application of external fixator  DOS: 09/18/20 Procedure: Removal of external fixator under local anesthesia  79 y.o. male presents for folllow up. States wound is draining less, and the drainage is thin and watery unlike before.  Objective:  Physical Exam: tenderness at the surgical site, local edema noted and calf supple, nontender. Incision: healed dorsally. Plantar incision macerated with sanguinous drainage, no purulence noted today. Foot without warmth, erythema, signs of infection.  Assessment:   1. Diabetic ulcer of left midfoot associated with type 1 diabetes mellitus, with fat layer exposed ([place])    Plan:  Patient was evaluated and treated and all questions answered.  Post-operative State -Surgical site well appearing. Repacked today -Cultures reviewed. No new growth. I think without the spacer his wound should progress without further abx -Stable DF deformity, flexible, noted of the great toe. Will hold off intervention at this time. -Continue packing daily.  No follow-ups on file.

## 2020-11-10 DIAGNOSIS — Z5181 Encounter for therapeutic drug level monitoring: Secondary | ICD-10-CM | POA: Diagnosis not present

## 2020-11-10 DIAGNOSIS — I10 Essential (primary) hypertension: Secondary | ICD-10-CM | POA: Diagnosis not present

## 2020-11-10 DIAGNOSIS — L02612 Cutaneous abscess of left foot: Secondary | ICD-10-CM | POA: Diagnosis not present

## 2020-11-10 DIAGNOSIS — E11628 Type 2 diabetes mellitus with other skin complications: Secondary | ICD-10-CM | POA: Diagnosis not present

## 2020-11-10 DIAGNOSIS — L03116 Cellulitis of left lower limb: Secondary | ICD-10-CM | POA: Diagnosis not present

## 2020-11-10 DIAGNOSIS — E782 Mixed hyperlipidemia: Secondary | ICD-10-CM | POA: Diagnosis not present

## 2020-11-10 DIAGNOSIS — Z7982 Long term (current) use of aspirin: Secondary | ICD-10-CM | POA: Diagnosis not present

## 2020-11-12 DIAGNOSIS — E785 Hyperlipidemia, unspecified: Secondary | ICD-10-CM | POA: Diagnosis not present

## 2020-11-12 DIAGNOSIS — E1165 Type 2 diabetes mellitus with hyperglycemia: Secondary | ICD-10-CM | POA: Diagnosis not present

## 2020-11-12 DIAGNOSIS — D509 Iron deficiency anemia, unspecified: Secondary | ICD-10-CM | POA: Diagnosis not present

## 2020-11-12 DIAGNOSIS — D519 Vitamin B12 deficiency anemia, unspecified: Secondary | ICD-10-CM | POA: Diagnosis not present

## 2020-11-12 DIAGNOSIS — I10 Essential (primary) hypertension: Secondary | ICD-10-CM | POA: Diagnosis not present

## 2020-11-12 DIAGNOSIS — E1142 Type 2 diabetes mellitus with diabetic polyneuropathy: Secondary | ICD-10-CM | POA: Diagnosis not present

## 2020-11-16 ENCOUNTER — Encounter: Payer: Medicare Other | Admitting: Podiatry

## 2020-11-17 DIAGNOSIS — Z7982 Long term (current) use of aspirin: Secondary | ICD-10-CM | POA: Diagnosis not present

## 2020-11-17 DIAGNOSIS — L02612 Cutaneous abscess of left foot: Secondary | ICD-10-CM | POA: Diagnosis not present

## 2020-11-17 DIAGNOSIS — L03116 Cellulitis of left lower limb: Secondary | ICD-10-CM | POA: Diagnosis not present

## 2020-11-17 DIAGNOSIS — E11628 Type 2 diabetes mellitus with other skin complications: Secondary | ICD-10-CM | POA: Diagnosis not present

## 2020-11-17 DIAGNOSIS — E782 Mixed hyperlipidemia: Secondary | ICD-10-CM | POA: Diagnosis not present

## 2020-11-17 DIAGNOSIS — I10 Essential (primary) hypertension: Secondary | ICD-10-CM | POA: Diagnosis not present

## 2020-11-19 ENCOUNTER — Telehealth: Payer: Self-pay

## 2020-11-19 NOTE — Telephone Encounter (Signed)
Hi, yes it is okay to pull the PICC.  Thanks

## 2020-11-19 NOTE — Telephone Encounter (Signed)
Spoke to [place] at [facility] to relay verbal orders per Dr. Earlene Plater that okay to pull PICC at end of treatment tomorrow. Orders repeated and verified.   Sandie Ano, RN

## 2020-11-19 NOTE — Telephone Encounter (Signed)
Received voicemail from Tim at [facility] requesting pull PICC orders for patient. IV antibiotic treatment due to end tomorrow (11/20/20). Will route to provider.   Sandie Ano, RN

## 2020-11-20 DIAGNOSIS — E11628 Type 2 diabetes mellitus with other skin complications: Secondary | ICD-10-CM | POA: Diagnosis not present

## 2020-11-20 DIAGNOSIS — L02612 Cutaneous abscess of left foot: Secondary | ICD-10-CM | POA: Diagnosis not present

## 2020-11-20 DIAGNOSIS — Z79899 Other long term (current) drug therapy: Secondary | ICD-10-CM | POA: Diagnosis not present

## 2020-11-20 DIAGNOSIS — Z794 Long term (current) use of insulin: Secondary | ICD-10-CM | POA: Diagnosis not present

## 2020-11-20 DIAGNOSIS — I10 Essential (primary) hypertension: Secondary | ICD-10-CM | POA: Diagnosis not present

## 2020-11-20 DIAGNOSIS — L03116 Cellulitis of left lower limb: Secondary | ICD-10-CM | POA: Diagnosis not present

## 2020-11-20 DIAGNOSIS — E782 Mixed hyperlipidemia: Secondary | ICD-10-CM | POA: Diagnosis not present

## 2020-11-20 DIAGNOSIS — Z7982 Long term (current) use of aspirin: Secondary | ICD-10-CM | POA: Diagnosis not present

## 2020-11-23 ENCOUNTER — Other Ambulatory Visit: Payer: Self-pay

## 2020-11-23 ENCOUNTER — Ambulatory Visit (INDEPENDENT_AMBULATORY_CARE_PROVIDER_SITE_OTHER): Payer: Medicare Other | Admitting: Podiatry

## 2020-11-23 DIAGNOSIS — M86672 Other chronic osteomyelitis, left ankle and foot: Secondary | ICD-10-CM

## 2020-11-23 DIAGNOSIS — E10621 Type 1 diabetes mellitus with foot ulcer: Secondary | ICD-10-CM

## 2020-11-23 DIAGNOSIS — L02612 Cutaneous abscess of left foot: Secondary | ICD-10-CM | POA: Diagnosis not present

## 2020-11-23 DIAGNOSIS — L03116 Cellulitis of left lower limb: Secondary | ICD-10-CM | POA: Diagnosis not present

## 2020-11-23 DIAGNOSIS — Z7982 Long term (current) use of aspirin: Secondary | ICD-10-CM | POA: Diagnosis not present

## 2020-11-23 DIAGNOSIS — E782 Mixed hyperlipidemia: Secondary | ICD-10-CM | POA: Diagnosis not present

## 2020-11-23 DIAGNOSIS — E11628 Type 2 diabetes mellitus with other skin complications: Secondary | ICD-10-CM | POA: Diagnosis not present

## 2020-11-23 DIAGNOSIS — I10 Essential (primary) hypertension: Secondary | ICD-10-CM | POA: Diagnosis not present

## 2020-11-23 DIAGNOSIS — L97422 Non-pressure chronic ulcer of left heel and midfoot with fat layer exposed: Secondary | ICD-10-CM

## 2020-11-23 NOTE — Progress Notes (Signed)
  Subjective:  Patient ID: James Norton., male    DOB: 1941-02-15,  MRN: [id number]  Chief Complaint  Patient presents with  . Routine Post Op    No N/v/f/ch. Pt says he feels fine is having no pain. Occasional neuropathy pain.TX: packing and dressing change. PICC line - completed last dose Friday. Less drainage + redness. Dressing is clean FBS- 86 A1C- 7.6   DOS: 07/09/20 Procedure: Tibial Sesamoidectomy left  DOS: 09/10/20 Procedure: Left 1st metatarsal and phalangeal resection, insertion of abx spacer, application of external fixator  DOS: 09/18/20 Procedure: Removal of external fixator under local anesthesia  80 y.o. male presents for folllow up. Pending PICC line removal today. Completed abx last week. Denies new issues with the wound.  Objective:  Physical Exam: tenderness at the surgical site, local edema noted and calf supple, nontender. Incision: healed dorsally. Plantar incision with sanguinous drainage, no purulence noted. Foot without warmth, erythema, signs of infection.  Assessment:   1. Diabetic ulcer of left midfoot associated with type 1 diabetes mellitus, with fat layer exposed ([place])   2. Chronic osteomyelitis involving left ankle and foot ([place])    Plan:  Patient was evaluated and treated and all questions answered.  Post-operative State -Wound continues to improve. Still with large wound to be packed but no purulence expressible today. Only SS drainage noted. Repacked with iodoform packing. -Ok to pull PICC. Has completed ~8 weeks of abx.  No follow-ups on file.

## 2020-11-30 ENCOUNTER — Ambulatory Visit: Payer: Medicare Other | Admitting: Internal Medicine

## 2020-11-30 ENCOUNTER — Encounter: Payer: Medicare Other | Admitting: Podiatry

## 2020-12-01 DIAGNOSIS — E11628 Type 2 diabetes mellitus with other skin complications: Secondary | ICD-10-CM | POA: Diagnosis not present

## 2020-12-01 DIAGNOSIS — Z7982 Long term (current) use of aspirin: Secondary | ICD-10-CM | POA: Diagnosis not present

## 2020-12-01 DIAGNOSIS — L02612 Cutaneous abscess of left foot: Secondary | ICD-10-CM | POA: Diagnosis not present

## 2020-12-01 DIAGNOSIS — L03116 Cellulitis of left lower limb: Secondary | ICD-10-CM | POA: Diagnosis not present

## 2020-12-01 DIAGNOSIS — E782 Mixed hyperlipidemia: Secondary | ICD-10-CM | POA: Diagnosis not present

## 2020-12-01 DIAGNOSIS — D519 Vitamin B12 deficiency anemia, unspecified: Secondary | ICD-10-CM | POA: Diagnosis not present

## 2020-12-01 DIAGNOSIS — I10 Essential (primary) hypertension: Secondary | ICD-10-CM | POA: Diagnosis not present

## 2020-12-03 ENCOUNTER — Ambulatory Visit (INDEPENDENT_AMBULATORY_CARE_PROVIDER_SITE_OTHER): Payer: Medicare Other | Admitting: Podiatry

## 2020-12-03 ENCOUNTER — Other Ambulatory Visit: Payer: Self-pay

## 2020-12-03 DIAGNOSIS — L97422 Non-pressure chronic ulcer of left heel and midfoot with fat layer exposed: Secondary | ICD-10-CM

## 2020-12-03 DIAGNOSIS — M86672 Other chronic osteomyelitis, left ankle and foot: Secondary | ICD-10-CM

## 2020-12-03 DIAGNOSIS — E10621 Type 1 diabetes mellitus with foot ulcer: Secondary | ICD-10-CM

## 2020-12-03 NOTE — Progress Notes (Signed)
  Subjective:  Patient ID: James Solo., male    DOB: 22-Sep-1941,  MRN: [id number]  Chief Complaint  Patient presents with  . Routine Post Op    POV #3 -pt states," looking better." - w/ less drainage -pt denis redness/swelling/pain Tx: packing daily and sx shoe    DOS: 07/09/20 Procedure: Tibial Sesamoidectomy left  DOS: 09/10/20 Procedure: Left 1st metatarsal and phalangeal resection, insertion of abx spacer, application of external fixator  DOS: 09/18/20 Procedure: Removal of external fixator under local anesthesia  80 y.o. male presents for folllow up.   Objective:  Physical Exam: tenderness at the surgical site, local edema noted and calf supple, nontender. Wound 0.3x1x1 Incision: healed dorsally. Plantar incision without active drainage, no purulence noted. Foot without warmth, erythema, signs of infection. Wound slightly macerated.  Assessment:   1. Diabetic ulcer of left midfoot associated with type 1 diabetes mellitus, with fat layer exposed ([place])   2. Chronic osteomyelitis involving left ankle and foot ([place])    Plan:  Patient was evaluated and treated and all questions answered.  Post-operative State -Wound improving without active drainage, only small amount of drainage noted by patient. Depth decreasing. Continue daily packing changes. Packed with iodoform today. F/u in 2 weeks. -Continue surgical shoe. -Will look into advanced wound care product to enhance healing given high infection risk.  Return in about 2 weeks (around 12/17/2020).

## 2020-12-09 DIAGNOSIS — Z7982 Long term (current) use of aspirin: Secondary | ICD-10-CM | POA: Diagnosis not present

## 2020-12-09 DIAGNOSIS — E782 Mixed hyperlipidemia: Secondary | ICD-10-CM | POA: Diagnosis not present

## 2020-12-09 DIAGNOSIS — I10 Essential (primary) hypertension: Secondary | ICD-10-CM | POA: Diagnosis not present

## 2020-12-09 DIAGNOSIS — L02612 Cutaneous abscess of left foot: Secondary | ICD-10-CM | POA: Diagnosis not present

## 2020-12-09 DIAGNOSIS — E11628 Type 2 diabetes mellitus with other skin complications: Secondary | ICD-10-CM | POA: Diagnosis not present

## 2020-12-09 DIAGNOSIS — L03116 Cellulitis of left lower limb: Secondary | ICD-10-CM | POA: Diagnosis not present

## 2020-12-15 ENCOUNTER — Ambulatory Visit (INDEPENDENT_AMBULATORY_CARE_PROVIDER_SITE_OTHER): Payer: Medicare Other | Admitting: Podiatry

## 2020-12-15 ENCOUNTER — Other Ambulatory Visit: Payer: Self-pay

## 2020-12-15 DIAGNOSIS — L02612 Cutaneous abscess of left foot: Secondary | ICD-10-CM

## 2020-12-15 DIAGNOSIS — L97422 Non-pressure chronic ulcer of left heel and midfoot with fat layer exposed: Secondary | ICD-10-CM

## 2020-12-15 DIAGNOSIS — E782 Mixed hyperlipidemia: Secondary | ICD-10-CM | POA: Diagnosis not present

## 2020-12-15 DIAGNOSIS — Z7982 Long term (current) use of aspirin: Secondary | ICD-10-CM | POA: Diagnosis not present

## 2020-12-15 DIAGNOSIS — E10621 Type 1 diabetes mellitus with foot ulcer: Secondary | ICD-10-CM

## 2020-12-15 DIAGNOSIS — I10 Essential (primary) hypertension: Secondary | ICD-10-CM | POA: Diagnosis not present

## 2020-12-15 DIAGNOSIS — L03032 Cellulitis of left toe: Secondary | ICD-10-CM | POA: Diagnosis not present

## 2020-12-15 DIAGNOSIS — E11628 Type 2 diabetes mellitus with other skin complications: Secondary | ICD-10-CM | POA: Diagnosis not present

## 2020-12-15 DIAGNOSIS — M86672 Other chronic osteomyelitis, left ankle and foot: Secondary | ICD-10-CM

## 2020-12-15 DIAGNOSIS — L03116 Cellulitis of left lower limb: Secondary | ICD-10-CM | POA: Diagnosis not present

## 2020-12-16 ENCOUNTER — Telehealth: Payer: Self-pay | Admitting: *Deleted

## 2020-12-16 NOTE — Progress Notes (Signed)
  Subjective:  Patient ID: James Norton., male    DOB: Sep 23, 1941,  MRN: [id number]  Chief Complaint  Patient presents with  . Post-op Problem    Puss coming out of bottom of left foot incision. Staples are still in place.    DOS: 07/09/20 Procedure: Tibial Sesamoidectomy left  DOS: 09/10/20 Procedure: Left 1st metatarsal and phalangeal resection, insertion of abx spacer, application of external fixator  DOS: 09/18/20 Procedure: Removal of external fixator under local anesthesia  80 y.o. male presents for folllow up. Was concerned that his wound had some pus come out of one of the staple sites. Thinks the plantar wound is doing well, able to get less and less packing material in the wound. Otherwise doing well.  Objective:  Physical Exam: tenderness at the surgical site, local edema noted and calf supple, nontender. Wound 0.3x0.5x0.5 Incision: healed dorsally. Plantar incision without active drainage, no purulence noted. Foot without warmth, erythema, signs of infection. Wound slightly macerated.  Assessment:   1. Diabetic ulcer of left midfoot associated with type 1 diabetes mellitus, with fat layer exposed ([place])   2. Chronic osteomyelitis involving left ankle and foot (HCC)   3. Cellulitis and abscess of toe of left foot    Plan:  Patient was evaluated and treated and all questions answered.  Post-operative State -Wound improving. There appears to have been a small stitch abscess. This did not come from the plantar wound itself. Patient believes the plantar wound to be healing well. -The remaining staples were removed. -Advised patient soak foot twice weekly. -Dress daily with prisma to the plantar central aspect. -Trial transition to normal shoegear.  No follow-ups on file.

## 2020-12-16 NOTE — Telephone Encounter (Signed)
Kerrie from [facility] called and left a message for Dr Price's patient and wanted to get a verbal order to re-cert for wound care for 9 weeks and go out once a week and I called Kerrie back and gave a verbal order per Dr March Rummage. Lattie Haw

## 2020-12-17 ENCOUNTER — Encounter: Payer: Medicare Other | Admitting: Podiatry

## 2020-12-20 DIAGNOSIS — I10 Essential (primary) hypertension: Secondary | ICD-10-CM | POA: Diagnosis not present

## 2020-12-20 DIAGNOSIS — Z79899 Other long term (current) drug therapy: Secondary | ICD-10-CM | POA: Diagnosis not present

## 2020-12-20 DIAGNOSIS — E782 Mixed hyperlipidemia: Secondary | ICD-10-CM | POA: Diagnosis not present

## 2020-12-20 DIAGNOSIS — L03116 Cellulitis of left lower limb: Secondary | ICD-10-CM | POA: Diagnosis not present

## 2020-12-20 DIAGNOSIS — Z794 Long term (current) use of insulin: Secondary | ICD-10-CM | POA: Diagnosis not present

## 2020-12-20 DIAGNOSIS — Z7982 Long term (current) use of aspirin: Secondary | ICD-10-CM | POA: Diagnosis not present

## 2020-12-20 DIAGNOSIS — L02612 Cutaneous abscess of left foot: Secondary | ICD-10-CM | POA: Diagnosis not present

## 2020-12-20 DIAGNOSIS — E11628 Type 2 diabetes mellitus with other skin complications: Secondary | ICD-10-CM | POA: Diagnosis not present

## 2020-12-21 DIAGNOSIS — E782 Mixed hyperlipidemia: Secondary | ICD-10-CM | POA: Diagnosis not present

## 2020-12-21 DIAGNOSIS — Z7982 Long term (current) use of aspirin: Secondary | ICD-10-CM | POA: Diagnosis not present

## 2020-12-21 DIAGNOSIS — L03116 Cellulitis of left lower limb: Secondary | ICD-10-CM | POA: Diagnosis not present

## 2020-12-21 DIAGNOSIS — I10 Essential (primary) hypertension: Secondary | ICD-10-CM | POA: Diagnosis not present

## 2020-12-21 DIAGNOSIS — L02612 Cutaneous abscess of left foot: Secondary | ICD-10-CM | POA: Diagnosis not present

## 2020-12-21 DIAGNOSIS — E11628 Type 2 diabetes mellitus with other skin complications: Secondary | ICD-10-CM | POA: Diagnosis not present

## 2020-12-24 ENCOUNTER — Encounter: Payer: Self-pay | Admitting: Podiatry

## 2020-12-24 ENCOUNTER — Ambulatory Visit (INDEPENDENT_AMBULATORY_CARE_PROVIDER_SITE_OTHER): Payer: Medicare Other | Admitting: Podiatry

## 2020-12-24 ENCOUNTER — Other Ambulatory Visit: Payer: Self-pay

## 2020-12-24 DIAGNOSIS — L97422 Non-pressure chronic ulcer of left heel and midfoot with fat layer exposed: Secondary | ICD-10-CM

## 2020-12-24 DIAGNOSIS — E10621 Type 1 diabetes mellitus with foot ulcer: Secondary | ICD-10-CM | POA: Diagnosis not present

## 2020-12-24 NOTE — Progress Notes (Signed)
  Subjective:  Patient ID: James Solo., male    DOB: 08/25/1941,  MRN: [id number]  Chief Complaint  Patient presents with  . Routine Post Op    I am doing better on the left foot and there is not much draining    DOS: 07/09/20 Procedure: Tibial Sesamoidectomy left  DOS: 09/10/20 Procedure: Left 1st metatarsal and phalangeal resection, insertion of abx spacer, application of external fixator  DOS: 09/18/20 Procedure: Removal of external fixator under local anesthesia  80 y.o. male presents for folllow up. States the wound is not draining much. Thinks it is doing better.  Objective:  Physical Exam: no tenderness at the surgical site, no edema noted and calf supple, nontender. Wound measuring 1.5x0.3x1, with slightly rolled borders and mild undermining. Toe rectus, not edematous.  Assessment:   1. Diabetic ulcer of left midfoot associated with type 1 diabetes mellitus, with fat layer exposed ([place])    Plan:  Patient was evaluated and treated and all questions answered.  Post-operative State -Wound improving. Debrided today of rolled border to viable base. -Puraply applied to the wound, moistened with saline; Puraply LOT [id number] exp 03/27/2023. -Leave graft intact until next week. Ok to change outer bandage. -Continue normal shoegear. The wound has not worsened with normal shoegear and has not had callus buildup. Ok to increase activity as tolerated.  No follow-ups on file.

## 2020-12-30 DIAGNOSIS — L02612 Cutaneous abscess of left foot: Secondary | ICD-10-CM | POA: Diagnosis not present

## 2020-12-30 DIAGNOSIS — I10 Essential (primary) hypertension: Secondary | ICD-10-CM | POA: Diagnosis not present

## 2020-12-30 DIAGNOSIS — E782 Mixed hyperlipidemia: Secondary | ICD-10-CM | POA: Diagnosis not present

## 2020-12-30 DIAGNOSIS — E11628 Type 2 diabetes mellitus with other skin complications: Secondary | ICD-10-CM | POA: Diagnosis not present

## 2020-12-30 DIAGNOSIS — Z7982 Long term (current) use of aspirin: Secondary | ICD-10-CM | POA: Diagnosis not present

## 2020-12-30 DIAGNOSIS — L03116 Cellulitis of left lower limb: Secondary | ICD-10-CM | POA: Diagnosis not present

## 2020-12-31 ENCOUNTER — Ambulatory Visit (INDEPENDENT_AMBULATORY_CARE_PROVIDER_SITE_OTHER): Payer: Medicare Other | Admitting: Podiatry

## 2020-12-31 ENCOUNTER — Other Ambulatory Visit: Payer: Self-pay

## 2020-12-31 DIAGNOSIS — E11621 Type 2 diabetes mellitus with foot ulcer: Secondary | ICD-10-CM | POA: Diagnosis not present

## 2020-12-31 DIAGNOSIS — L97421 Non-pressure chronic ulcer of left heel and midfoot limited to breakdown of skin: Secondary | ICD-10-CM

## 2020-12-31 NOTE — Progress Notes (Signed)
  Subjective:  Patient ID: James Solo., male    DOB: 02/27/1941,  MRN: [id number]  Chief Complaint  Patient presents with  . Wound Check    F/U Left  wound check -pt states wound is looking better and better -pt denies N/v/F/Ch    DOS: 07/09/20 Procedure: Tibial Sesamoidectomy left  DOS: 09/10/20 Procedure: Left 1st metatarsal and phalangeal resection, insertion of abx spacer, application of external fixator  DOS: 09/18/20 Procedure: Removal of external fixator under local anesthesia  80 y.o. male presents for folllow up. Wound continues to look   Objective:  Physical Exam: no tenderness at the surgical site, no edema noted and calf supple, nontender. Wound measuring 1.4x0.6x0.8.   Assessment:   1. Diabetic ulcer of left midfoot associated with type 2 diabetes mellitus, limited to breakdown of skin ([place])    Plan:  Patient was evaluated and treated and all questions answered.  Post-operative State -Wound continues to improve. No significant hyperkeratosis, depth improving. -Affinity 2.5x2.5 graft applied, ZP#[id number] Exp 01/02/21. -Leave graft intact until next Monday. Will move up 2nd application due to me being out of town rather than delay treatment. Will plan for waiting 7 days thereafter. -Continue normal shoegear.   No follow-ups on file.

## 2021-01-04 ENCOUNTER — Other Ambulatory Visit: Payer: Self-pay

## 2021-01-04 ENCOUNTER — Ambulatory Visit (INDEPENDENT_AMBULATORY_CARE_PROVIDER_SITE_OTHER): Payer: Medicare Other | Admitting: Podiatry

## 2021-01-04 ENCOUNTER — Encounter: Payer: Self-pay | Admitting: Podiatry

## 2021-01-04 DIAGNOSIS — E11621 Type 2 diabetes mellitus with foot ulcer: Secondary | ICD-10-CM | POA: Diagnosis not present

## 2021-01-04 DIAGNOSIS — L97421 Non-pressure chronic ulcer of left heel and midfoot limited to breakdown of skin: Secondary | ICD-10-CM

## 2021-01-04 DIAGNOSIS — D519 Vitamin B12 deficiency anemia, unspecified: Secondary | ICD-10-CM | POA: Diagnosis not present

## 2021-01-04 NOTE — Progress Notes (Signed)
  Subjective:  Patient ID: James Solo., male    DOB: [birth year],  MRN: [id number]  Chief Complaint  Patient presents with  . Routine Post Op    I am doing a lot better on the left foot    DOS: 07/09/20 Procedure: Tibial Sesamoidectomy left  DOS: 09/10/20 Procedure: Left 1st metatarsal and phalangeal resection, insertion of abx spacer, application of external fixator  DOS: 09/18/20 Procedure: Removal of external fixator under local anesthesia  80 y.o. male presents for folllow up. Wound doing better did not change primary dressing, did change outer bandage. Only mild amount of drainage noted.  Objective:  Physical Exam: no tenderness at the surgical site, no edema noted and calf supple, nontender. Wound measuring 0.3x1x0.4  Assessment:   1. Diabetic ulcer of left midfoot associated with type 2 diabetes mellitus, limited to breakdown of skin ([place])    Plan:  Patient was evaluated and treated and all questions answered.  Post-operative State -Wound continues to improve. No hyperkeratosis, wound minimally debrided. -Affinity 2.5 x 2.5 applied; ID [id number] Exp 01/07/21. Covered with adaptic, steri-strips, bolster gauze, kerlix, ACE bandage -Continue normal shoegear.   No follow-ups on file.

## 2021-01-05 DIAGNOSIS — Z7982 Long term (current) use of aspirin: Secondary | ICD-10-CM | POA: Diagnosis not present

## 2021-01-05 DIAGNOSIS — I10 Essential (primary) hypertension: Secondary | ICD-10-CM | POA: Diagnosis not present

## 2021-01-05 DIAGNOSIS — L02612 Cutaneous abscess of left foot: Secondary | ICD-10-CM | POA: Diagnosis not present

## 2021-01-05 DIAGNOSIS — L03116 Cellulitis of left lower limb: Secondary | ICD-10-CM | POA: Diagnosis not present

## 2021-01-05 DIAGNOSIS — E782 Mixed hyperlipidemia: Secondary | ICD-10-CM | POA: Diagnosis not present

## 2021-01-05 DIAGNOSIS — E11628 Type 2 diabetes mellitus with other skin complications: Secondary | ICD-10-CM | POA: Diagnosis not present

## 2021-01-11 ENCOUNTER — Other Ambulatory Visit: Payer: Self-pay

## 2021-01-11 ENCOUNTER — Ambulatory Visit (INDEPENDENT_AMBULATORY_CARE_PROVIDER_SITE_OTHER): Payer: Medicare Other | Admitting: Podiatry

## 2021-01-11 DIAGNOSIS — E11621 Type 2 diabetes mellitus with foot ulcer: Secondary | ICD-10-CM

## 2021-01-11 DIAGNOSIS — L97522 Non-pressure chronic ulcer of other part of left foot with fat layer exposed: Secondary | ICD-10-CM | POA: Diagnosis not present

## 2021-01-11 NOTE — Progress Notes (Signed)
  Subjective:  Patient ID: James Norton., male    DOB: Jul 09, 1941,  MRN: [id number]  Chief Complaint  Patient presents with  . Routine Post Op    POV -pt denies N/V/F/Ch -dressing clean and intact -pt states no pain at wound are just with neuropathy pain -w/ a raw place at 5th met base from guaze rubbign Tx: boot    DOS: 07/09/20 Procedure: Tibial Sesamoidectomy left  DOS: 09/10/20 Procedure: Left 1st metatarsal and phalangeal resection, insertion of abx spacer, application of external fixator  DOS: 09/18/20 Procedure: Removal of external fixator under local anesthesia  80 y.o. male presents for folllow up. States he had a new issue of higher pressure on the outside of the foot but otherwise no complaints.  Objective:  Physical Exam: no tenderness at the surgical site, no edema noted and calf supple, nontender. Wound measuring 0.3x1x0.4  Assessment:   1. Diabetic ulcer of toe of left foot associated with type 2 diabetes mellitus, with fat layer exposed ([place])    Plan:  Patient was evaluated and treated and all questions answered.  Post-operative State -Wound healing well, similar to last visit. Next visit plan for repeat application and closure in office if still not healed. -Wound cleansed, minimally debrided, Organogenesis affinity applied VF#[id number], Exp 01/14/21. Covered with mepitel, 4x4, kling, ACE -Continue normal shoegear.   No follow-ups on file.

## 2021-01-12 DIAGNOSIS — E782 Mixed hyperlipidemia: Secondary | ICD-10-CM | POA: Diagnosis not present

## 2021-01-12 DIAGNOSIS — I10 Essential (primary) hypertension: Secondary | ICD-10-CM | POA: Diagnosis not present

## 2021-01-12 DIAGNOSIS — E11628 Type 2 diabetes mellitus with other skin complications: Secondary | ICD-10-CM | POA: Diagnosis not present

## 2021-01-12 DIAGNOSIS — Z7982 Long term (current) use of aspirin: Secondary | ICD-10-CM | POA: Diagnosis not present

## 2021-01-12 DIAGNOSIS — L02612 Cutaneous abscess of left foot: Secondary | ICD-10-CM | POA: Diagnosis not present

## 2021-01-12 DIAGNOSIS — L03116 Cellulitis of left lower limb: Secondary | ICD-10-CM | POA: Diagnosis not present

## 2021-01-18 ENCOUNTER — Ambulatory Visit (INDEPENDENT_AMBULATORY_CARE_PROVIDER_SITE_OTHER): Payer: Medicare Other | Admitting: Podiatry

## 2021-01-18 ENCOUNTER — Other Ambulatory Visit: Payer: Self-pay

## 2021-01-18 DIAGNOSIS — E11621 Type 2 diabetes mellitus with foot ulcer: Secondary | ICD-10-CM

## 2021-01-18 DIAGNOSIS — E1142 Type 2 diabetes mellitus with diabetic polyneuropathy: Secondary | ICD-10-CM | POA: Diagnosis not present

## 2021-01-18 DIAGNOSIS — E785 Hyperlipidemia, unspecified: Secondary | ICD-10-CM | POA: Diagnosis not present

## 2021-01-18 DIAGNOSIS — E1165 Type 2 diabetes mellitus with hyperglycemia: Secondary | ICD-10-CM | POA: Diagnosis not present

## 2021-01-18 DIAGNOSIS — L97522 Non-pressure chronic ulcer of other part of left foot with fat layer exposed: Secondary | ICD-10-CM

## 2021-01-18 NOTE — Progress Notes (Signed)
  Subjective:  Patient ID: James Solo., male    DOB: 07-16-1941,  MRN: [id number]  No chief complaint on file.  DOS: 07/09/20 Procedure: Tibial Sesamoidectomy left  DOS: 09/10/20 Procedure: Left 1st metatarsal and phalangeal resection, insertion of abx spacer, application of external fixator  DOS: 09/18/20 Procedure: Removal of external fixator under local anesthesia  80 y.o. male presents for folllow up. Overall foot is doing well did not change dressing as requested but did have to adjust the wrap.  Objective:  Physical Exam: no tenderness at the surgical site, no edema noted and calf supple, nontender. Wound egdes appear flattened, measuring 0.6x0.3  Assessment:   1. Diabetic ulcer of toe of left foot associated with type 2 diabetes mellitus, with fat layer exposed ([place])    Plan:  Patient was evaluated and treated and all questions answered.  Post-operative State -Continues to improve. The wound is slightly larger in one dimension only due to the wound healing from inside out. Applied one final graft. Wound minimally debrided, cleansed. Organogenesis affinity 2.5x2.5, [id number], exp 01/20/21 applied. Covered with mepitel, 4x4, kerlix, [place] -Continue normal shoegear.   No follow-ups on file.

## 2021-02-01 ENCOUNTER — Ambulatory Visit (INDEPENDENT_AMBULATORY_CARE_PROVIDER_SITE_OTHER): Payer: Medicare Other | Admitting: Podiatry

## 2021-02-01 ENCOUNTER — Encounter: Payer: Self-pay | Admitting: Podiatry

## 2021-02-01 ENCOUNTER — Other Ambulatory Visit: Payer: Self-pay

## 2021-02-01 DIAGNOSIS — E11621 Type 2 diabetes mellitus with foot ulcer: Secondary | ICD-10-CM

## 2021-02-01 DIAGNOSIS — L97522 Non-pressure chronic ulcer of other part of left foot with fat layer exposed: Secondary | ICD-10-CM

## 2021-02-01 NOTE — Progress Notes (Signed)
  Subjective:  Patient ID: James Solo., male    DOB: September 03, 1941,  MRN: [id number]  Chief Complaint  Patient presents with  . Routine Post Op    I am doing a lot better and there is not a lot of draining on the left foot    DOS: 07/09/20 Procedure: Tibial Sesamoidectomy left  DOS: 09/10/20 Procedure: Left 1st metatarsal and phalangeal resection, insertion of abx spacer, application of external fixator  DOS: 09/18/20 Procedure: Removal of external fixator under local anesthesia  80 y.o. male presents for folllow up. States the wound is improving with a small amount of bloody drainage during dressing changes.   Objective:  Physical Exam: no tenderness at the surgical site, no edema noted and calf supple, nontender. Wound egdes appear flattened, measuring 1 cm linear. No warmth, erythema, signs of infection noted.  Assessment:   1. Diabetic ulcer of toe of left foot associated with type 2 diabetes mellitus, with fat layer exposed ([place])    Plan:  Patient was evaluated and treated and all questions answered.  Post-operative State -There is a small fissure that has not closed. I think primary closure should help accelerate healing. Performed as below. Ok to shower, dress daily with abx ointment and bandage.  Procedure: Wound closure Anesthesia: Lidocaine 1% plain; 1.87mL and Marcaine 0.5% plain; 1.53mL Instrumentation: 3-0 prolene Technique: The wound was cleansed and debrided of non-viable soft tissue with a tissue nipper. The wound was then infiltrated with local anesthesia. The skin was prepped with betadine and draped. The wound was primarily closed with 3-0 nylon mattress suture. The wound was cleansed and sterile dressing applied Disposition: Patient tolerated procedure well.   Return in about 2 weeks (around 02/15/2021) for wound f/u.

## 2021-02-04 DIAGNOSIS — D519 Vitamin B12 deficiency anemia, unspecified: Secondary | ICD-10-CM | POA: Diagnosis not present

## 2021-02-08 DIAGNOSIS — D509 Iron deficiency anemia, unspecified: Secondary | ICD-10-CM | POA: Diagnosis not present

## 2021-02-08 DIAGNOSIS — E1142 Type 2 diabetes mellitus with diabetic polyneuropathy: Secondary | ICD-10-CM | POA: Diagnosis not present

## 2021-02-08 DIAGNOSIS — D519 Vitamin B12 deficiency anemia, unspecified: Secondary | ICD-10-CM | POA: Diagnosis not present

## 2021-02-08 DIAGNOSIS — E785 Hyperlipidemia, unspecified: Secondary | ICD-10-CM | POA: Diagnosis not present

## 2021-02-10 DIAGNOSIS — E1142 Type 2 diabetes mellitus with diabetic polyneuropathy: Secondary | ICD-10-CM | POA: Diagnosis not present

## 2021-02-10 DIAGNOSIS — E1165 Type 2 diabetes mellitus with hyperglycemia: Secondary | ICD-10-CM | POA: Diagnosis not present

## 2021-02-10 DIAGNOSIS — I10 Essential (primary) hypertension: Secondary | ICD-10-CM | POA: Diagnosis not present

## 2021-02-10 DIAGNOSIS — D509 Iron deficiency anemia, unspecified: Secondary | ICD-10-CM | POA: Diagnosis not present

## 2021-02-10 DIAGNOSIS — D519 Vitamin B12 deficiency anemia, unspecified: Secondary | ICD-10-CM | POA: Diagnosis not present

## 2021-02-10 DIAGNOSIS — E785 Hyperlipidemia, unspecified: Secondary | ICD-10-CM | POA: Diagnosis not present

## 2021-02-15 ENCOUNTER — Encounter: Payer: Self-pay | Admitting: Podiatry

## 2021-02-15 ENCOUNTER — Ambulatory Visit (INDEPENDENT_AMBULATORY_CARE_PROVIDER_SITE_OTHER): Payer: Medicare Other | Admitting: Podiatry

## 2021-02-15 ENCOUNTER — Other Ambulatory Visit: Payer: Self-pay

## 2021-02-15 DIAGNOSIS — E11621 Type 2 diabetes mellitus with foot ulcer: Secondary | ICD-10-CM

## 2021-02-15 DIAGNOSIS — L97522 Non-pressure chronic ulcer of other part of left foot with fat layer exposed: Secondary | ICD-10-CM

## 2021-02-15 NOTE — Progress Notes (Signed)
  Subjective:  Patient ID: James Solo., male    DOB: 1940/12/12,  MRN: [id number]  Chief Complaint  Patient presents with  . Routine Post Op    I am doing ok and I have a stitch left and it has been there for about 2 weeks    DOS: 07/09/20 Procedure: Tibial Sesamoidectomy left  DOS: 09/10/20 Procedure: Left 1st metatarsal and phalangeal resection, insertion of abx spacer, application of external fixator  DOS: 09/18/20 Procedure: Removal of external fixator under local anesthesia  80 y.o. male presents for folllow up. States the wound is doing well, not draining.  Objective:  Physical Exam: no tenderness at the surgical site, no edema noted and calf supple, nontender. Wound egdes appear flattened, measuring 1 cm linear. No warmth, erythema, signs of infection noted.  Assessment:   1. Diabetic ulcer of toe of left foot associated with type 2 diabetes mellitus, with fat layer exposed ([place])    Plan:  Patient was evaluated and treated and all questions answered.  Post-operative State -Suture removed. Fissure remains. Wound slightly macerated but not overly hyperkeratotic. -Plan for wound graft application vs closure at next visits if not healed.  Return in about 2 weeks (around 03/01/2021) for Wound Care.

## 2021-02-17 DIAGNOSIS — E785 Hyperlipidemia, unspecified: Secondary | ICD-10-CM | POA: Diagnosis not present

## 2021-02-17 DIAGNOSIS — Z1331 Encounter for screening for depression: Secondary | ICD-10-CM | POA: Diagnosis not present

## 2021-02-17 DIAGNOSIS — Z0000 Encounter for general adult medical examination without abnormal findings: Secondary | ICD-10-CM | POA: Diagnosis not present

## 2021-02-17 DIAGNOSIS — Z9181 History of falling: Secondary | ICD-10-CM | POA: Diagnosis not present

## 2021-02-17 DIAGNOSIS — Z139 Encounter for screening, unspecified: Secondary | ICD-10-CM | POA: Diagnosis not present

## 2021-03-04 ENCOUNTER — Other Ambulatory Visit: Payer: Self-pay

## 2021-03-04 ENCOUNTER — Ambulatory Visit (INDEPENDENT_AMBULATORY_CARE_PROVIDER_SITE_OTHER): Payer: Medicare Other | Admitting: Podiatry

## 2021-03-04 DIAGNOSIS — L97522 Non-pressure chronic ulcer of other part of left foot with fat layer exposed: Secondary | ICD-10-CM | POA: Diagnosis not present

## 2021-03-04 DIAGNOSIS — E11621 Type 2 diabetes mellitus with foot ulcer: Secondary | ICD-10-CM

## 2021-03-04 NOTE — Progress Notes (Signed)
  Subjective:  Patient ID: James Norton., male    DOB: 06-Jul-1941,  MRN: [id number]  Chief Complaint  Patient presents with  . Ulcer    F/U Lt ulcer check -pt states," same but with a hard crust around it." - no sweling/drainage/redness Tx: betadine and bandaid   DOS: 07/09/20 Procedure: Tibial Sesamoidectomy left  DOS: 09/10/20 Procedure: Left 1st metatarsal and phalangeal resection, insertion of abx spacer, application of external fixator  DOS: 09/18/20 Procedure: Removal of external fixator under local anesthesia  80 y.o. male presents for folllow up. History above confirmed with patient.  Objective:  Physical Exam: no tenderness at the surgical site, no edema noted and calf supple, nontender. Wound egdes flattened without central fissure, measuring 1x0.5. No warmth, erythema, signs of infection noted.  Assessment:   1. Diabetic ulcer of toe of left foot associated with type 2 diabetes mellitus, with fat layer exposed ([place])    Plan:  Patient was evaluated and treated and all questions answered.  Post-operative State -Wound appears superficial today. No  -Wound debrided and cleansed. Affinity graft applied to the wound. Covered with mepitel, 4x4, kerlix, ACE. AO#[id number] exp 03/05/21 -F/u in 1 week for repeat application   Return in about 1 week (around 9/62/9528) for graft application.

## 2021-03-08 DIAGNOSIS — D519 Vitamin B12 deficiency anemia, unspecified: Secondary | ICD-10-CM | POA: Diagnosis not present

## 2021-03-11 ENCOUNTER — Ambulatory Visit (INDEPENDENT_AMBULATORY_CARE_PROVIDER_SITE_OTHER): Payer: Medicare Other | Admitting: Podiatry

## 2021-03-11 ENCOUNTER — Encounter: Payer: Self-pay | Admitting: Podiatry

## 2021-03-11 ENCOUNTER — Other Ambulatory Visit: Payer: Self-pay

## 2021-03-11 DIAGNOSIS — E11621 Type 2 diabetes mellitus with foot ulcer: Secondary | ICD-10-CM | POA: Diagnosis not present

## 2021-03-11 DIAGNOSIS — L97522 Non-pressure chronic ulcer of other part of left foot with fat layer exposed: Secondary | ICD-10-CM | POA: Diagnosis not present

## 2021-03-11 NOTE — Progress Notes (Signed)
  Subjective:  Patient ID: James Solo., male    DOB: 08/11/1941,  MRN: [id number]  Chief Complaint  Patient presents with  . Routine Post Op    I have had the bandage on for about a week now and there is some draining on the left foot    DOS: 07/09/20 Procedure: Tibial Sesamoidectomy left  DOS: 09/10/20 Procedure: Left 1st metatarsal and phalangeal resection, insertion of abx spacer, application of external fixator  DOS: 09/18/20 Procedure: Removal of external fixator under local anesthesia  80 y.o. male presents for folllow up. History above confirmed with patient.  Objective:  Physical Exam: no tenderness at the surgical site, no edema noted and calf supple, nontender. Wound egdes flattened without central fissure, measuring 0.5x0.5. No warmth, erythema, signs of infection noted.  Assessment:   1. Diabetic ulcer of toe of left foot associated with type 2 diabetes mellitus, with fat layer exposed ([place])    Plan:  Patient was evaluated and treated and all questions answered.  Post-operative State -Wound appears superficial today. No  -Wound cleansed and minimally debrided. Affinity graft TI#[id number] exp 03/12/21. Covered with mepitel, actisorb, 4x4, kerlix, ACE. -F/u in 1 week for wound check. Depending upon if it is closed we may or may not repeat graft application.  Return in about 1 week (around 03/18/2021) for Wound check, possible graft application.

## 2021-03-18 ENCOUNTER — Ambulatory Visit: Payer: Medicare Other | Admitting: Podiatry

## 2021-03-18 ENCOUNTER — Ambulatory Visit (INDEPENDENT_AMBULATORY_CARE_PROVIDER_SITE_OTHER): Payer: Medicare Other | Admitting: Podiatry

## 2021-03-18 ENCOUNTER — Encounter: Payer: Self-pay | Admitting: Podiatry

## 2021-03-18 ENCOUNTER — Other Ambulatory Visit: Payer: Self-pay

## 2021-03-18 DIAGNOSIS — L97522 Non-pressure chronic ulcer of other part of left foot with fat layer exposed: Secondary | ICD-10-CM

## 2021-03-18 DIAGNOSIS — E11621 Type 2 diabetes mellitus with foot ulcer: Secondary | ICD-10-CM

## 2021-03-18 NOTE — Progress Notes (Signed)
  Subjective:  Patient ID: James Solo., male    DOB: 11-Nov-1941,  MRN: [id number]  Chief Complaint  Patient presents with  . Routine Post Op    I am doing better and I did not take the bandage off until today on the left foot    DOS: 07/09/20 Procedure: Tibial Sesamoidectomy left  DOS: 09/10/20 Procedure: Left 1st metatarsal and phalangeal resection, insertion of abx spacer, application of external fixator  DOS: 09/18/20 Procedure: Removal of external fixator under local anesthesia  80 y.o. male presents for folllow up. History above confirmed with patient.  Objective:  Physical Exam: no tenderness at the surgical site, no edema noted and calf supple, nontender. Wound egdes flattened without central fissure, measuring 05.x0.3. No warmth, erythema, signs of infection noted.  Assessment:   1. Diabetic ulcer of toe of left foot associated with type 2 diabetes mellitus, with fat layer exposed ([place])    Plan:  Patient was evaluated and treated and all questions answered.  Post-operative State -Wound appears to be improving. -Wound cleansed and minimally debrided. Affinity graft QJ#[id number] exp 03/19/21. Covered with mepitel, foam, DSD. -F/u in 1 week   No follow-ups on file.

## 2021-03-25 ENCOUNTER — Ambulatory Visit (INDEPENDENT_AMBULATORY_CARE_PROVIDER_SITE_OTHER): Payer: Medicare Other | Admitting: Podiatry

## 2021-03-25 ENCOUNTER — Other Ambulatory Visit: Payer: Self-pay

## 2021-03-25 ENCOUNTER — Encounter: Payer: Self-pay | Admitting: Podiatry

## 2021-03-25 DIAGNOSIS — L97522 Non-pressure chronic ulcer of other part of left foot with fat layer exposed: Secondary | ICD-10-CM

## 2021-03-25 DIAGNOSIS — E11621 Type 2 diabetes mellitus with foot ulcer: Secondary | ICD-10-CM | POA: Diagnosis not present

## 2021-03-25 NOTE — Progress Notes (Signed)
  Subjective:  Patient ID: James Solo., male    DOB: [birth year],  MRN: [id number]  Chief Complaint  Patient presents with  . Routine Post Op    The bandage did come off and we had to re wrap it and it looks like it did when the bandage come off as it does today on the left foot    DOS: 07/09/20 Procedure: Tibial Sesamoidectomy left  DOS: 09/10/20 Procedure: Left 1st metatarsal and phalangeal resection, insertion of abx spacer, application of external fixator  DOS: 09/18/20 Procedure: Removal of external fixator under local anesthesia  80 y.o. male presents for folllow up. History above confirmed with patient.  Objective:  Physical Exam: no tenderness at the surgical site, no edema noted and calf supple, nontender. Wound appears healed. Minimal hyperkeratosis  Assessment:   1. Diabetic ulcer of toe of left foot associated with type 2 diabetes mellitus, with fat layer exposed ([place])    Plan:  Patient was evaluated and treated and all questions answered.  Post-operative State -Wound gnetly debrided of HPK, appears healed. -Would benefit from new DM inserts and shoes. Will make appt  Return in about 1 month (around 04/25/2021) for Wound check.

## 2021-04-05 DIAGNOSIS — M791 Myalgia, unspecified site: Secondary | ICD-10-CM | POA: Diagnosis not present

## 2021-04-05 DIAGNOSIS — Z20828 Contact with and (suspected) exposure to other viral communicable diseases: Secondary | ICD-10-CM | POA: Diagnosis not present

## 2021-04-05 DIAGNOSIS — R5383 Other fatigue: Secondary | ICD-10-CM | POA: Diagnosis not present

## 2021-04-06 DIAGNOSIS — A084 Viral intestinal infection, unspecified: Secondary | ICD-10-CM | POA: Diagnosis not present

## 2021-04-08 DIAGNOSIS — D519 Vitamin B12 deficiency anemia, unspecified: Secondary | ICD-10-CM | POA: Diagnosis not present

## 2021-04-26 ENCOUNTER — Ambulatory Visit: Payer: Medicare Other | Admitting: Podiatry

## 2021-04-28 ENCOUNTER — Ambulatory Visit (INDEPENDENT_AMBULATORY_CARE_PROVIDER_SITE_OTHER): Payer: Medicare Other | Admitting: Podiatry

## 2021-04-28 ENCOUNTER — Other Ambulatory Visit: Payer: Self-pay

## 2021-04-28 DIAGNOSIS — L97522 Non-pressure chronic ulcer of other part of left foot with fat layer exposed: Secondary | ICD-10-CM

## 2021-04-28 DIAGNOSIS — E11621 Type 2 diabetes mellitus with foot ulcer: Secondary | ICD-10-CM

## 2021-04-28 NOTE — Progress Notes (Signed)
Patient presented for foam casting for 3 pair custom diabetic shoe inserts. Patient is measured with a brannock device to be a size 11 wide  Diabetic shoes are chosen from the [employer]. The shoes chosen are [id number]  The patient will be contacted when the shoes and inserts are ready to be picked up

## 2021-04-29 ENCOUNTER — Ambulatory Visit: Payer: Medicare Other | Admitting: Podiatry

## 2021-05-03 ENCOUNTER — Ambulatory Visit (INDEPENDENT_AMBULATORY_CARE_PROVIDER_SITE_OTHER): Payer: Medicare Other | Admitting: Podiatry

## 2021-05-03 ENCOUNTER — Other Ambulatory Visit: Payer: Self-pay

## 2021-05-03 ENCOUNTER — Ambulatory Visit (INDEPENDENT_AMBULATORY_CARE_PROVIDER_SITE_OTHER): Payer: Medicare Other

## 2021-05-03 DIAGNOSIS — M1460 Charcot's joint, unspecified site: Secondary | ICD-10-CM | POA: Diagnosis not present

## 2021-05-03 DIAGNOSIS — M14672 Charcot's joint, left ankle and foot: Secondary | ICD-10-CM

## 2021-05-03 DIAGNOSIS — R609 Edema, unspecified: Secondary | ICD-10-CM | POA: Diagnosis not present

## 2021-05-03 DIAGNOSIS — E11610 Type 2 diabetes mellitus with diabetic neuropathic arthropathy: Secondary | ICD-10-CM | POA: Diagnosis not present

## 2021-05-03 DIAGNOSIS — E11621 Type 2 diabetes mellitus with foot ulcer: Secondary | ICD-10-CM

## 2021-05-03 DIAGNOSIS — R600 Localized edema: Secondary | ICD-10-CM

## 2021-05-03 DIAGNOSIS — L97522 Non-pressure chronic ulcer of other part of left foot with fat layer exposed: Secondary | ICD-10-CM

## 2021-05-03 NOTE — Progress Notes (Signed)
  Subjective:  Patient ID: James Solo., male    DOB: 07-Jul-1941,  MRN: [id number]  Chief Complaint  Patient presents with   Angioedema    Pain and swelling at Lt ankle -BL sides x 3 days -w/ neuropathy pain -w/ heel pain; 5/10 Tx: IBU, resting -worse with walking at heel    80 y.o. male presents with the above complaint. History confirmed with patient.  Objective:  Physical Exam: warm, good capillary refill, no trophic changes or ulcerative lesions, normal DP and PT pulses, and normal sensory exam. Left Foot: left midfoot warmth, scant erythema. No crepitus. No loss of pedal arch, no rocker bottom deformity.  No images are attached to the encounter.  Radiographs: X-ray of the left foot: left midfoot destruction, worsened compared to prior. Bone erosions noted. Assessment:   1. Charcot's joint   2. Diabetic ulcer of toe of left foot associated with type 2 diabetes mellitus, with fat layer exposed ([place])   3. Charcot's joint arthropathy in type 2 diabetes mellitus ([place])    Plan:  Patient was evaluated and treated and all questions answered.  Charcot foot left -Xrs reviewed with patient -Reviewed Dx of Charcot -Explained importance of strict NWB to prevent further complication -Soft cast applied. New CAM boot dispensed. -Order knee scooter and bedside commode. -XRs needed at follow-up: 3 view Foot  Return in about 3 weeks (around 05/24/2021) for Charcot f/u.

## 2021-05-04 ENCOUNTER — Telehealth: Payer: Self-pay

## 2021-05-04 NOTE — Telephone Encounter (Signed)
-----   Message from Evelina Bucy, DPM sent at 05/03/2021  5:04 PM EDT ----- Can we send orders for DME to [facility] if possible or wherever else.

## 2021-05-04 NOTE — Telephone Encounter (Signed)
Faxed orders for DME bedside commode and knee scooter to [facility] [phone]

## 2021-05-07 ENCOUNTER — Telehealth: Payer: Self-pay | Admitting: Podiatry

## 2021-05-07 ENCOUNTER — Other Ambulatory Visit: Payer: Self-pay | Admitting: Podiatry

## 2021-05-07 DIAGNOSIS — M1460 Charcot's joint, unspecified site: Secondary | ICD-10-CM

## 2021-05-07 DIAGNOSIS — R609 Edema, unspecified: Secondary | ICD-10-CM

## 2021-05-07 NOTE — Telephone Encounter (Signed)
Pt was measured for diabetic shoes and picked out the [id number] but it is not avail in the size he needs. Pt is to come in to see Dr March Rummage and will pick out a new shoe at that time. On 7.11.2022 in [place] office.

## 2021-05-07 NOTE — Telephone Encounter (Signed)
It should only be on for 7 days  He can remove it, let us know if he needs help.  Make sure he is wearing his boot at all times

## 2021-05-07 NOTE — Telephone Encounter (Signed)
Pt notified and understands/reb °

## 2021-05-07 NOTE — Telephone Encounter (Signed)
He is currently having a flare up of Charcot I would like to hold off on measuring him for at least a month

## 2021-05-07 NOTE — Telephone Encounter (Signed)
Pt called & wanted to verify that he is to leave dressing on until he comes back in 3 wks for appt.

## 2021-05-10 ENCOUNTER — Ambulatory Visit: Payer: Medicare Other | Admitting: Podiatry

## 2021-05-10 DIAGNOSIS — D519 Vitamin B12 deficiency anemia, unspecified: Secondary | ICD-10-CM | POA: Diagnosis not present

## 2021-05-13 DIAGNOSIS — L821 Other seborrheic keratosis: Secondary | ICD-10-CM | POA: Diagnosis not present

## 2021-05-13 DIAGNOSIS — L570 Actinic keratosis: Secondary | ICD-10-CM | POA: Diagnosis not present

## 2021-05-13 DIAGNOSIS — L578 Other skin changes due to chronic exposure to nonionizing radiation: Secondary | ICD-10-CM | POA: Diagnosis not present

## 2021-05-14 DIAGNOSIS — E785 Hyperlipidemia, unspecified: Secondary | ICD-10-CM | POA: Diagnosis not present

## 2021-05-14 DIAGNOSIS — D509 Iron deficiency anemia, unspecified: Secondary | ICD-10-CM | POA: Diagnosis not present

## 2021-05-14 DIAGNOSIS — E1142 Type 2 diabetes mellitus with diabetic polyneuropathy: Secondary | ICD-10-CM | POA: Diagnosis not present

## 2021-05-14 DIAGNOSIS — D519 Vitamin B12 deficiency anemia, unspecified: Secondary | ICD-10-CM | POA: Diagnosis not present

## 2021-05-14 DIAGNOSIS — Z125 Encounter for screening for malignant neoplasm of prostate: Secondary | ICD-10-CM | POA: Diagnosis not present

## 2021-05-18 DIAGNOSIS — D519 Vitamin B12 deficiency anemia, unspecified: Secondary | ICD-10-CM | POA: Diagnosis not present

## 2021-05-18 DIAGNOSIS — I10 Essential (primary) hypertension: Secondary | ICD-10-CM | POA: Diagnosis not present

## 2021-05-18 DIAGNOSIS — D509 Iron deficiency anemia, unspecified: Secondary | ICD-10-CM | POA: Diagnosis not present

## 2021-05-18 DIAGNOSIS — E785 Hyperlipidemia, unspecified: Secondary | ICD-10-CM | POA: Diagnosis not present

## 2021-05-18 DIAGNOSIS — E1142 Type 2 diabetes mellitus with diabetic polyneuropathy: Secondary | ICD-10-CM | POA: Diagnosis not present

## 2021-05-20 DIAGNOSIS — Z23 Encounter for immunization: Secondary | ICD-10-CM | POA: Diagnosis not present

## 2021-05-24 ENCOUNTER — Telehealth: Payer: Self-pay | Admitting: Podiatry

## 2021-05-24 ENCOUNTER — Ambulatory Visit (INDEPENDENT_AMBULATORY_CARE_PROVIDER_SITE_OTHER): Payer: Medicare Other | Admitting: Podiatry

## 2021-05-24 ENCOUNTER — Other Ambulatory Visit: Payer: Self-pay

## 2021-05-24 ENCOUNTER — Ambulatory Visit (INDEPENDENT_AMBULATORY_CARE_PROVIDER_SITE_OTHER): Payer: Medicare Other

## 2021-05-24 DIAGNOSIS — M14672 Charcot's joint, left ankle and foot: Secondary | ICD-10-CM

## 2021-05-24 DIAGNOSIS — M1460 Charcot's joint, unspecified site: Secondary | ICD-10-CM

## 2021-05-24 NOTE — Progress Notes (Signed)
  Subjective:  Patient ID: James Norton., male    DOB: [birth year],  MRN: [id number]  Chief Complaint  Patient presents with   charcot    F/U Lt charcot foot -pt states," looks and feels much better." -w/ less nuropathy pain tx: boot and elevation   80 y.o. male presents with the above complaint. History confirmed with patient.  Objective:  Physical Exam: warm, good capillary refill, no trophic changes or ulcerative lesions, normal DP and PT pulses, and normal sensory exam. Left Foot: left midfoot witthout warmth or erythema today. No crepitus. No loss of pedal arch, no rocker bottom deformity.  No images are attached to the encounter.  Radiographs: X-ray of the left foot: left midfoot destruction, stable Assessment:   1. Charcot's joint    Plan:  Patient was evaluated and treated and all questions answered.  Charcot foot left -Xrs reviewed with patient  -Re-educatd on importance of strict NWB to prevent further complication -Soft cast re-applied.   -XRs needed at follow-up: 3 view Foot  Procedure: Multilayer Compression dressing Rationale: venous insufficiency Technique: Unna boot, cast padding, Coban compression dressing applied Disposition: Patient tolerated procedure well.   No follow-ups on file.

## 2021-05-24 NOTE — Telephone Encounter (Signed)
Pt was in today and he picked out a boot style 481 highline. Is it ok for me to order the shoes and inserts now Dr March Rummage?

## 2021-06-10 ENCOUNTER — Ambulatory Visit (INDEPENDENT_AMBULATORY_CARE_PROVIDER_SITE_OTHER): Payer: Medicare Other

## 2021-06-10 ENCOUNTER — Encounter: Payer: Self-pay | Admitting: Podiatry

## 2021-06-10 ENCOUNTER — Ambulatory Visit (INDEPENDENT_AMBULATORY_CARE_PROVIDER_SITE_OTHER): Payer: Medicare Other | Admitting: Podiatry

## 2021-06-10 ENCOUNTER — Other Ambulatory Visit: Payer: Self-pay

## 2021-06-10 DIAGNOSIS — M1460 Charcot's joint, unspecified site: Secondary | ICD-10-CM | POA: Diagnosis not present

## 2021-06-10 DIAGNOSIS — R600 Localized edema: Secondary | ICD-10-CM | POA: Diagnosis not present

## 2021-06-10 DIAGNOSIS — D519 Vitamin B12 deficiency anemia, unspecified: Secondary | ICD-10-CM | POA: Diagnosis not present

## 2021-06-10 NOTE — Progress Notes (Signed)
  Subjective:  Patient ID: James Norton., male    DOB: November 08, 1941,  MRN: [id number]  Chief Complaint  Patient presents with   Foot Pain    I am doing ok on the left and I am wearing the boot like he told me and the walker is helping    80 y.o. male presents with the above complaint. History confirmed with patient.  Objective:  Physical Exam: warm, good capillary refill, no trophic changes or ulcerative lesions, normal DP and PT pulses, and normal sensory exam. Left Foot: left midfoot witthout warmth or erythema today. No crepitus. No loss of pedal arch, no rocker bottom deformity.  No images are attached to the encounter.  Radiographs: X-ray of the left foot: left midfoot destruction, stable without acute worsening Assessment:   1. Charcot's joint   2. Localized edema    Plan:  Patient was evaluated and treated and all questions answered.  Charcot foot left -Still with some warmth which is concerning for continued Charcot process will have to continue immobilization for now.  -New x-rays reviewed patient no interval significant changes noted -Soft cast reapplied today for immobilization protection -Patient given supplies to perform Unna boot dressing next week -XRs needed at follow-up: 3 view Foot  Procedure: Multilayer Compression dressing Rationale: venous insufficiency Technique: Unna boot, cast padding, Coban compression dressing applied Disposition: Patient tolerated procedure well.    No follow-ups on file.

## 2021-06-24 ENCOUNTER — Ambulatory Visit (INDEPENDENT_AMBULATORY_CARE_PROVIDER_SITE_OTHER): Payer: Medicare Other | Admitting: Podiatry

## 2021-06-24 ENCOUNTER — Encounter: Payer: Self-pay | Admitting: Podiatry

## 2021-06-24 ENCOUNTER — Ambulatory Visit (INDEPENDENT_AMBULATORY_CARE_PROVIDER_SITE_OTHER): Payer: Medicare Other

## 2021-06-24 ENCOUNTER — Other Ambulatory Visit: Payer: Self-pay

## 2021-06-24 DIAGNOSIS — M1460 Charcot's joint, unspecified site: Secondary | ICD-10-CM

## 2021-06-24 DIAGNOSIS — R600 Localized edema: Secondary | ICD-10-CM

## 2021-06-24 DIAGNOSIS — M2042 Other hammer toe(s) (acquired), left foot: Secondary | ICD-10-CM | POA: Diagnosis not present

## 2021-06-24 DIAGNOSIS — E1142 Type 2 diabetes mellitus with diabetic polyneuropathy: Secondary | ICD-10-CM | POA: Diagnosis not present

## 2021-06-24 DIAGNOSIS — M2041 Other hammer toe(s) (acquired), right foot: Secondary | ICD-10-CM | POA: Diagnosis not present

## 2021-06-24 NOTE — Progress Notes (Signed)
  Subjective:  Patient ID: James Solo., male    DOB: 1941-11-02,  MRN: [id number]  Chief Complaint  Patient presents with   Nail Problem    Toenail trim   Routine Post Op    I am doing ok on the left and I am here to get the diabetic shoes   80 y.o. male presents with the above complaint. History confirmed with patient.  Objective:  Physical Exam: warm, good capillary refill, no trophic changes or ulcerative lesions, normal DP and PT pulses, and normal sensory exam. Left Foot: left midfoot with mild warmth, no erythema today. No crepitus. No loss of pedal arch, no rocker bottom deformity.  No images are attached to the encounter.  Radiographs: X-ray of the left foot: left midfoot destruction, stable without acute worsening or interval change. Assessment:   1. Charcot's joint   2. Localized edema   3. DM type 2 with diabetic peripheral neuropathy ([place])   4. Hammer toes of both feet    Plan:  Patient was evaluated and treated and all questions answered.  Charcot foot left -Still with slight warmth but repeat XR without acute worsening. Will continue NWB precautions and boot for 3 more weeks and recheck XR  -XRs needed at follow-up: 3 view Foot  Diabetes, hammertoes, hallux valgus -Dispensed DM shoes today. Reviewed written and oral break-in instructions. Patient wanted to try them on at home rather than the office advised he let us know promptly of any issues.  No follow-ups on file.

## 2021-07-12 DIAGNOSIS — D519 Vitamin B12 deficiency anemia, unspecified: Secondary | ICD-10-CM | POA: Diagnosis not present

## 2021-07-15 ENCOUNTER — Ambulatory Visit: Payer: Medicare Other | Admitting: Podiatry

## 2021-07-26 ENCOUNTER — Ambulatory Visit (INDEPENDENT_AMBULATORY_CARE_PROVIDER_SITE_OTHER): Payer: Medicare Other | Admitting: Podiatry

## 2021-07-26 ENCOUNTER — Ambulatory Visit (INDEPENDENT_AMBULATORY_CARE_PROVIDER_SITE_OTHER): Payer: Medicare Other

## 2021-07-26 ENCOUNTER — Other Ambulatory Visit: Payer: Self-pay

## 2021-07-26 ENCOUNTER — Other Ambulatory Visit: Payer: Self-pay | Admitting: Podiatry

## 2021-07-26 DIAGNOSIS — M1460 Charcot's joint, unspecified site: Secondary | ICD-10-CM

## 2021-07-26 DIAGNOSIS — R600 Localized edema: Secondary | ICD-10-CM

## 2021-07-26 DIAGNOSIS — M14672 Charcot's joint, left ankle and foot: Secondary | ICD-10-CM

## 2021-07-26 NOTE — Progress Notes (Signed)
  Subjective:  Patient ID: James Solo., male    DOB: 08/04/1941,  MRN: [id number]  No chief complaint on file.  80 y.o. male presents with the above complaint. History confirmed with patient. Thinks the skin temperature has decreased. States the past 1 week he has had no pain and feels like his foot is back to normal.  Objective:  Physical Exam: warm, good capillary refill, no trophic changes or ulcerative lesions, normal DP and PT pulses, and normal sensory exam. Left Foot: left midfoot with mild warmth, no erythema today. No crepitus. No loss of pedal arch, no rocker bottom deformity.  No images are attached to the encounter.  Radiographs: X-ray of the left foot: left midfoot destruction, stable without acute worsening or interval change. Signs of consolidation noted. Assessment:   1. Charcot's joint   2. Localized edema    Plan:  Patient was evaluated and treated and all questions answered.  Charcot foot left -XR without worsening erosion, but with some signs of consolidation -Foot temperature normalized. Believed the end of the Charcot flare. Will progress WB but will still immobilize in boot for 2 more weeks. If new Xrs show no change will allow out of boot. -XRs needed at follow-up: 3 view Foot   No follow-ups on file.

## 2021-08-02 ENCOUNTER — Other Ambulatory Visit: Payer: Self-pay | Admitting: Podiatry

## 2021-08-02 DIAGNOSIS — R600 Localized edema: Secondary | ICD-10-CM

## 2021-08-02 DIAGNOSIS — M14672 Charcot's joint, left ankle and foot: Secondary | ICD-10-CM

## 2021-08-12 ENCOUNTER — Ambulatory Visit: Payer: Medicare Other | Admitting: Podiatry

## 2021-08-12 DIAGNOSIS — D519 Vitamin B12 deficiency anemia, unspecified: Secondary | ICD-10-CM | POA: Diagnosis not present

## 2021-08-18 DIAGNOSIS — D509 Iron deficiency anemia, unspecified: Secondary | ICD-10-CM | POA: Diagnosis not present

## 2021-08-18 DIAGNOSIS — D519 Vitamin B12 deficiency anemia, unspecified: Secondary | ICD-10-CM | POA: Diagnosis not present

## 2021-08-18 DIAGNOSIS — R972 Elevated prostate specific antigen [PSA]: Secondary | ICD-10-CM | POA: Diagnosis not present

## 2021-08-18 DIAGNOSIS — E1142 Type 2 diabetes mellitus with diabetic polyneuropathy: Secondary | ICD-10-CM | POA: Diagnosis not present

## 2021-08-18 DIAGNOSIS — E785 Hyperlipidemia, unspecified: Secondary | ICD-10-CM | POA: Diagnosis not present

## 2021-08-19 ENCOUNTER — Ambulatory Visit (INDEPENDENT_AMBULATORY_CARE_PROVIDER_SITE_OTHER): Payer: Medicare Other | Admitting: Podiatry

## 2021-08-19 ENCOUNTER — Ambulatory Visit (INDEPENDENT_AMBULATORY_CARE_PROVIDER_SITE_OTHER): Payer: Medicare Other

## 2021-08-19 ENCOUNTER — Encounter: Payer: Self-pay | Admitting: Podiatry

## 2021-08-19 DIAGNOSIS — M1460 Charcot's joint, unspecified site: Secondary | ICD-10-CM | POA: Diagnosis not present

## 2021-08-24 DIAGNOSIS — Z23 Encounter for immunization: Secondary | ICD-10-CM | POA: Diagnosis not present

## 2021-08-24 DIAGNOSIS — D519 Vitamin B12 deficiency anemia, unspecified: Secondary | ICD-10-CM | POA: Diagnosis not present

## 2021-08-24 DIAGNOSIS — D509 Iron deficiency anemia, unspecified: Secondary | ICD-10-CM | POA: Diagnosis not present

## 2021-08-24 DIAGNOSIS — I10 Essential (primary) hypertension: Secondary | ICD-10-CM | POA: Diagnosis not present

## 2021-08-24 DIAGNOSIS — E785 Hyperlipidemia, unspecified: Secondary | ICD-10-CM | POA: Diagnosis not present

## 2021-08-24 DIAGNOSIS — E1142 Type 2 diabetes mellitus with diabetic polyneuropathy: Secondary | ICD-10-CM | POA: Diagnosis not present

## 2021-08-26 NOTE — Progress Notes (Signed)
  Subjective:  Patient ID: Thereasa Solo., male    DOB: 20-Sep-1941,  MRN: [id number]  Chief Complaint  Patient presents with   Routine Post Op    I am doing fine and I have had no problems on the left foot     80 y.o. male presents with the above complaint. History confirmed with patient.  States the foot feels a lot better and he has not had any problems.  Objective:  Physical Exam: warm, good capillary refill, no trophic changes or ulcerative lesions, normal DP and PT pulses, and normal sensory exam. Left Foot: left midfoot with without warmth or erythema today. No crepitus. No loss of pedal arch, no rocker bottom deformity.  No images are attached to the encounter.  Radiographs: X-ray of the left foot: Stable coalesced midfoot destruction without interval change Assessment:   1. Charcot's joint    Plan:  Patient was evaluated and treated and all questions answered.  Charcot foot left -Repeat x-rays without worsening erosions.  Foot type stable. -The excessive warmth and erythema appears to have resolved.  His foot is near normal temperature.  We discussed that he can continue his normal shoe gear to ensure that he does not have any pain or areas of irritation and that the redness has not returned. -XRs needed at follow-up: 3 view Foot   No follow-ups on file.

## 2021-09-13 DIAGNOSIS — D519 Vitamin B12 deficiency anemia, unspecified: Secondary | ICD-10-CM | POA: Diagnosis not present

## 2021-10-02 DIAGNOSIS — Z23 Encounter for immunization: Secondary | ICD-10-CM | POA: Diagnosis not present

## 2021-10-13 DIAGNOSIS — D519 Vitamin B12 deficiency anemia, unspecified: Secondary | ICD-10-CM | POA: Diagnosis not present

## 2021-10-21 ENCOUNTER — Ambulatory Visit (INDEPENDENT_AMBULATORY_CARE_PROVIDER_SITE_OTHER): Payer: Medicare Other

## 2021-10-21 ENCOUNTER — Encounter: Payer: Self-pay | Admitting: Podiatry

## 2021-10-21 ENCOUNTER — Ambulatory Visit (INDEPENDENT_AMBULATORY_CARE_PROVIDER_SITE_OTHER): Payer: Medicare Other | Admitting: Podiatry

## 2021-10-21 DIAGNOSIS — E1140 Type 2 diabetes mellitus with diabetic neuropathy, unspecified: Secondary | ICD-10-CM

## 2021-10-21 DIAGNOSIS — E1169 Type 2 diabetes mellitus with other specified complication: Secondary | ICD-10-CM

## 2021-10-21 DIAGNOSIS — M1460 Charcot's joint, unspecified site: Secondary | ICD-10-CM

## 2021-10-21 DIAGNOSIS — E1142 Type 2 diabetes mellitus with diabetic polyneuropathy: Secondary | ICD-10-CM

## 2021-10-21 DIAGNOSIS — B351 Tinea unguium: Secondary | ICD-10-CM

## 2021-10-21 MED ORDER — GABAPENTIN 300 MG PO CAPS: 300.0000 mg | ORAL_CAPSULE | Freq: Every day | ORAL | 2 refills | Status: DC

## 2021-10-21 NOTE — Progress Notes (Signed)
  Subjective:  Patient ID: James Norton., male    DOB: 1941-02-01,  MRN: [id number]  Chief Complaint  Patient presents with   Routine Post Op    Doing ok and I have had some neuropathy pain in the last several months   80 y.o. male presents with the above complaint. History confirmed with patient. Left foot is doing well not had any pain or issues and has been able to return to work without problems.  Has had some neuropathy pains - had for years but has been bad the past couple days. Usually takes ibuprofen. Objective:  Physical Exam: warm, good capillary refill, no trophic changes or ulcerative lesions, normal DP and PT pulses, and normal sensory exam. Left Foot: left midfoot with without warmth or erythema today. No crepitus. No loss of pedal arch, no rocker bottom deformity. Nails elongated and dystrophic.  No images are attached to the encounter.  Radiographs: X-ray of the left foot: Stable coalesced midfoot destruction without interval change Assessment:   1. Charcot's joint   2. Onychomycosis of multiple toenails with type 2 diabetes mellitus and peripheral neuropathy ([place])   3. Diabetic neuropathy, painful ([place])    Plan:  Patient was evaluated and treated and all questions answered.  Charcot foot left -Repeat x-rays with consolidation -Appears the worst of this flare is over -F/u sooner should issues persist  Diabetic Neuropathy -Discussed gabapentin. Discussed r/b/a of medication. -Rx sent to pharmacy  Onychomycosis, Diabetes, and DPN -Patient is diabetic with a qualifying condition for at risk foot care.  Procedure: Nail Debridement Type of Debridement: manual, sharp debridement. Instrumentation: Nail nipper, rotary burr. Number of Nails: 10  Return in about 3 months (around 01/19/2022) for Diabetic Foot Care.

## 2021-10-25 ENCOUNTER — Encounter: Payer: Self-pay | Admitting: Podiatry

## 2021-10-25 ENCOUNTER — Ambulatory Visit (INDEPENDENT_AMBULATORY_CARE_PROVIDER_SITE_OTHER): Payer: Medicare Other | Admitting: Podiatry

## 2021-10-25 ENCOUNTER — Ambulatory Visit (INDEPENDENT_AMBULATORY_CARE_PROVIDER_SITE_OTHER): Payer: Medicare Other

## 2021-10-25 VITALS — Temp 97.7°F

## 2021-10-25 DIAGNOSIS — E11610 Type 2 diabetes mellitus with diabetic neuropathic arthropathy: Secondary | ICD-10-CM | POA: Diagnosis not present

## 2021-10-25 DIAGNOSIS — M14672 Charcot's joint, left ankle and foot: Secondary | ICD-10-CM

## 2021-10-25 DIAGNOSIS — M1460 Charcot's joint, unspecified site: Secondary | ICD-10-CM | POA: Diagnosis not present

## 2021-10-25 NOTE — Progress Notes (Signed)
  Subjective:  Patient ID: James Solo., male    DOB: 1941/03/25,  MRN: [id number]  No chief complaint on file.  80 y.o. male presents with the above complaint. History confirmed with patient. States Friday (day after he was seen) he had a lot of pain in his left foot and he could not walk on it. Foot was hot to touch and swollen. Feeling much better today but still warm. Objective:  Physical Exam: warm, good capillary refill, no trophic changes or ulcerative lesions, normal DP and PT pulses, and normal sensory exam. Left Foot: left midfoot with warmth at medial ankle, no erythema today. No crepitus. No loss of pedal arch, no rocker bottom deformity.   No images are attached to the encounter.  Radiographs: X-ray of the left foot: Stable coalesced midfoot destruction without interval change Assessment:   1. Charcot's joint   2. Charcot's joint arthropathy in type 2 diabetes mellitus ([place])    Plan:  Patient was evaluated and treated and all questions answered.  Charcot foot left -Repeat x-rays without worsening -Recurrence of flare suspected.  -Continue NWB with walker. Dispensed new boot.  Return in about 17 days (around 11/11/2021) for Charcot f/u, with XRs.

## 2021-11-11 ENCOUNTER — Ambulatory Visit (INDEPENDENT_AMBULATORY_CARE_PROVIDER_SITE_OTHER): Payer: Medicare Other | Admitting: Podiatry

## 2021-11-11 ENCOUNTER — Encounter: Payer: Self-pay | Admitting: Podiatry

## 2021-11-11 ENCOUNTER — Other Ambulatory Visit: Payer: Self-pay

## 2021-11-11 ENCOUNTER — Ambulatory Visit (INDEPENDENT_AMBULATORY_CARE_PROVIDER_SITE_OTHER): Payer: Medicare Other

## 2021-11-11 DIAGNOSIS — M1460 Charcot's joint, unspecified site: Secondary | ICD-10-CM

## 2021-11-11 DIAGNOSIS — E11610 Type 2 diabetes mellitus with diabetic neuropathic arthropathy: Secondary | ICD-10-CM | POA: Diagnosis not present

## 2021-11-12 DIAGNOSIS — D519 Vitamin B12 deficiency anemia, unspecified: Secondary | ICD-10-CM | POA: Diagnosis not present

## 2021-11-16 NOTE — Progress Notes (Signed)
°  Subjective:  Patient ID: James Norton., male    DOB: 01/16/1941,  MRN: [id number]  Chief Complaint  Patient presents with   Routine Post Op    after seeing Dr March Rummage on Thursday the week end was better on the left foot     81 y.o. male presents with the above complaint. History confirmed with patient. States the pain resolved by the weekend after he was seen. Denies new pedal issues today. Objective:  Physical Exam: warm, good capillary refill, no trophic changes or ulcerative lesions, normal DP and PT pulses, and normal sensory exam. Left Foot: left midfoot without warmth at medial ankle, no erythema today. No crepitus. No loss of pedal arch, no rocker bottom deformity.   No images are attached to the encounter.  Radiographs: X-ray of the left foot: Stable coalesced midfoot destruction without interval change Assessment:   1. Charcot's joint    Plan:  Patient was evaluated and treated and all questions answered.  Charcot foot left -Repeat x-rays without worsening -Likely quiescent at this point. Ok to return to normal shoegear and advised if he notices any pain or redness/warmth to return to CAM boot.   No follow-ups on file.

## 2021-11-19 DIAGNOSIS — D519 Vitamin B12 deficiency anemia, unspecified: Secondary | ICD-10-CM | POA: Diagnosis not present

## 2021-11-19 DIAGNOSIS — D509 Iron deficiency anemia, unspecified: Secondary | ICD-10-CM | POA: Diagnosis not present

## 2021-11-19 DIAGNOSIS — E785 Hyperlipidemia, unspecified: Secondary | ICD-10-CM | POA: Diagnosis not present

## 2021-11-19 DIAGNOSIS — E1142 Type 2 diabetes mellitus with diabetic polyneuropathy: Secondary | ICD-10-CM | POA: Diagnosis not present

## 2021-11-24 DIAGNOSIS — I10 Essential (primary) hypertension: Secondary | ICD-10-CM | POA: Diagnosis not present

## 2021-11-24 DIAGNOSIS — E785 Hyperlipidemia, unspecified: Secondary | ICD-10-CM | POA: Diagnosis not present

## 2021-11-24 DIAGNOSIS — D519 Vitamin B12 deficiency anemia, unspecified: Secondary | ICD-10-CM | POA: Diagnosis not present

## 2021-11-24 DIAGNOSIS — M19079 Primary osteoarthritis, unspecified ankle and foot: Secondary | ICD-10-CM | POA: Diagnosis not present

## 2021-11-24 DIAGNOSIS — D509 Iron deficiency anemia, unspecified: Secondary | ICD-10-CM | POA: Diagnosis not present

## 2021-11-24 DIAGNOSIS — E1142 Type 2 diabetes mellitus with diabetic polyneuropathy: Secondary | ICD-10-CM | POA: Diagnosis not present

## 2021-12-14 DIAGNOSIS — D519 Vitamin B12 deficiency anemia, unspecified: Secondary | ICD-10-CM | POA: Diagnosis not present

## 2021-12-27 ENCOUNTER — Encounter: Payer: Self-pay | Admitting: Podiatry

## 2021-12-27 ENCOUNTER — Ambulatory Visit (INDEPENDENT_AMBULATORY_CARE_PROVIDER_SITE_OTHER): Payer: Medicare Other | Admitting: Podiatry

## 2021-12-27 DIAGNOSIS — E11610 Type 2 diabetes mellitus with diabetic neuropathic arthropathy: Secondary | ICD-10-CM

## 2021-12-27 DIAGNOSIS — M1460 Charcot's joint, unspecified site: Secondary | ICD-10-CM

## 2021-12-27 NOTE — Progress Notes (Signed)
°  Subjective:  Patient ID: James Solo., male    DOB: [birth year],  MRN: [id number]  Chief Complaint  Patient presents with   Routine Post Op    I am doing fine on the left foot and there may be some slight swelling   81 y.o. male presents with the above complaint. History confirmed with patient. Denies new issues with his foot.  Also of note patient states that he is taking Gabapentin nightly and noticed near resolution of neuropathy symptoms. Objective:  Physical Exam: warm, good capillary refill, no trophic changes or ulcerative lesions, normal DP and PT pulses, and normal sensory exam. Left Foot: left midfoot without warmth at medial ankle, no erythema today. No crepitus. No loss of pedal arch, no rocker bottom deformity.   Assessment:   1. Charcot's joint   2. Charcot's joint arthropathy in type 2 diabetes mellitus ([place])     Plan:  Patient was evaluated and treated and all questions answered.  Charcot foot left -Remains quiescent. No repeat XR indicated at this time.  -Discussed signs of recurrence and prompt reporting of this -Courtesy debridement of small callus left 1st/5th MPJ -Continue normal shoegear.  Return in about 6 weeks (around 02/07/2022) for Charcot f/u.

## 2022-01-13 DIAGNOSIS — D519 Vitamin B12 deficiency anemia, unspecified: Secondary | ICD-10-CM | POA: Diagnosis not present

## 2022-01-20 ENCOUNTER — Ambulatory Visit: Payer: Medicare Other | Admitting: Podiatry

## 2022-01-31 ENCOUNTER — Encounter: Payer: Self-pay | Admitting: Podiatry

## 2022-01-31 ENCOUNTER — Ambulatory Visit (INDEPENDENT_AMBULATORY_CARE_PROVIDER_SITE_OTHER): Payer: Medicare Other | Admitting: Podiatry

## 2022-01-31 ENCOUNTER — Ambulatory Visit (INDEPENDENT_AMBULATORY_CARE_PROVIDER_SITE_OTHER): Payer: Medicare Other

## 2022-01-31 ENCOUNTER — Other Ambulatory Visit: Payer: Self-pay

## 2022-01-31 DIAGNOSIS — E11610 Type 2 diabetes mellitus with diabetic neuropathic arthropathy: Secondary | ICD-10-CM

## 2022-01-31 DIAGNOSIS — L97522 Non-pressure chronic ulcer of other part of left foot with fat layer exposed: Secondary | ICD-10-CM | POA: Diagnosis not present

## 2022-01-31 DIAGNOSIS — Z794 Long term (current) use of insulin: Secondary | ICD-10-CM

## 2022-01-31 DIAGNOSIS — E1169 Type 2 diabetes mellitus with other specified complication: Secondary | ICD-10-CM

## 2022-01-31 NOTE — Progress Notes (Signed)
?  Subjective:  ?Patient ID: James Norton., male    DOB: January 07, 1941,  MRN: [id number] ? ?Chief Complaint  ?Patient presents with  ? charcot's joint  ?   Charcot's joint 6 week follow up  ? ?81 y.o. male presents with the above complaint. History confirmed with patient. Denies new issues with his foot.Relates he is doing well and neuropathy is well controlled. Does have some calluses on his left foot that have been trimmed in the past.  Last A1c was 7.6.  ? ?PCP Associated at [place]  ? ? ?Objective:  ?Physical Exam: ?warm, good capillary refill, no trophic changes or ulcerative lesions, normal DP and PT pulses, and normal sensory exam. ?Left Foot: left midfoot without warmth at medial ankle, no erythema today. No crepitus. No loss of pedal arch, no rocker bottom deformity.  Upon debridement of left first metatarsal callus ulcer noted with granular base measuring 1 cm x 0.2 cm x 0.2 cm. No erythema edema or purulence noted.  ?  ?Assessment:  ? ?1. Charcot's joint arthropathy in type 2 diabetes mellitus ([place])   ? ? ?Plan:  ?Patient was evaluated and treated and all questions answered. ? ?Charcot foot left ?-Remains quiescent. XRs reviewed and no changes noted from previous Xrs.  ?-Discussed signs of recurrence and prompt reporting of this ?Ulcer left first metatarsal with fat layer exposed  ?-Debridement as below. ?-Dressed with betadine, DSD. ?-Off-loading with surgical shoe. ?-No abx indicated.  ?-Discussed glucose control and proper protein-rich diet.  ?-Discussed if any worsening redness, pain, fever or chills to call or may need to report to the emergency room. Patient expressed understanding.  ? ?Procedure: Excisional Debridement of Wound ?Rationale: Removal of non-viable soft tissue from the wound to promote healing.  ?Anesthesia: none ?Pre-Debridement Wound Measurements:  overlying callus  ?Post-Debridement Wound Measurements: 1 cm x 0.2 cm x 2 cm  ?Type of Debridement: Sharp Excisional ?Tissue  Removed: Non-viable soft tissue ?Depth of Debridement: subcutaneous tissue. ?Technique: Sharp excisional debridement to bleeding, viable wound base.  ?Dressing: Dry, sterile, compression dressing. ?Disposition: Patient tolerated procedure well. Patient to return in 2 week for follow-up. ? ?Return in about 2 weeks (around 02/14/2022) for wound check. ? ? ? ?No follow-ups on file.  ? ? ?

## 2022-02-07 ENCOUNTER — Ambulatory Visit: Payer: Medicare Other | Admitting: Podiatry

## 2022-02-14 ENCOUNTER — Ambulatory Visit (INDEPENDENT_AMBULATORY_CARE_PROVIDER_SITE_OTHER): Payer: Medicare Other | Admitting: Podiatry

## 2022-02-14 ENCOUNTER — Encounter: Payer: Self-pay | Admitting: Podiatry

## 2022-02-14 DIAGNOSIS — E11610 Type 2 diabetes mellitus with diabetic neuropathic arthropathy: Secondary | ICD-10-CM

## 2022-02-14 DIAGNOSIS — E1169 Type 2 diabetes mellitus with other specified complication: Secondary | ICD-10-CM

## 2022-02-14 DIAGNOSIS — D519 Vitamin B12 deficiency anemia, unspecified: Secondary | ICD-10-CM | POA: Diagnosis not present

## 2022-02-14 DIAGNOSIS — L97522 Non-pressure chronic ulcer of other part of left foot with fat layer exposed: Secondary | ICD-10-CM | POA: Diagnosis not present

## 2022-02-14 DIAGNOSIS — Z794 Long term (current) use of insulin: Secondary | ICD-10-CM

## 2022-02-14 NOTE — Progress Notes (Signed)
?  Subjective:  ?Patient ID: James Norton., male    DOB: [birth year],  MRN: [id number] ? ?Chief Complaint  ?Patient presents with  ? Foot Ulcer  ?  The ball of the left foot is about the same and it looks like a callus now  ? ?81 y.o. male presents for follow-up of left foot wound. Has been dressing as instructed. Relates it is doing about the same. Denies any other complaints.  Last A1c was 7.6. Denies n/v/f/c.  ? ?PCP:  Associated at [place]  ? ? ?Objective:  ?Physical Exam: ?warm, good capillary refill, no trophic changes or ulcerative lesions, normal DP and PT pulses, and normal sensory exam. ?Left Foot: left midfoot without warmth at medial ankle, no erythema today. No crepitus. No loss of pedal arch, no rocker bottom deformity.  Left first metatarsal plantar ulcer noted with granular base measuring 0.5 cm x 0.1 cm x 0.2 cm. No erythema edema or purulence noted.  ?  ?Assessment:  ? ?1. Ulcer of left foot with fat layer exposed ([place])   ?2. Type 2 diabetes mellitus with other specified complication, with long-term current use of insulin ([place])   ?3. Charcot's joint arthropathy in type 2 diabetes mellitus ([place])   ? ? ? ?Plan:  ?Patient was evaluated and treated and all questions answered. ? ?Charcot foot left ?-Remains quiescent.No Xrs today.  ?-Discussed signs of recurrence and prompt reporting of this ?Ulcer left first metatarsal with fat layer exposed  ?-Debridement as below. ?-Dressed with betadine, DSD. ?-Off-loading with surgical shoe. ?-No abx indicated.  ?-Discussed glucose control and proper protein-rich diet.  ?-Discussed if any worsening redness, pain, fever or chills to call or may need to report to the emergency room. Patient expressed understanding.  ? ?Procedure: Excisional Debridement of Wound ?Rationale: Removal of non-viable soft tissue from the wound to promote healing.  ?Anesthesia: none ?Pre-Debridement Wound Measurements:  overlying callus  ?Post-Debridement Wound Measurements:  0.5cm x 0.1 cm x 2 cm  ?Type of Debridement: Sharp Excisional ?Tissue Removed: Non-viable soft tissue ?Depth of Debridement: subcutaneous tissue. ?Technique: Sharp excisional debridement to bleeding, viable wound base.  ?Dressing: Dry, sterile, compression dressing. ?Disposition: Patient tolerated procedure well. Patient to return in 2 week for follow-up. ? ?No follow-ups on file. ? ? ? ?No follow-ups on file.  ? ? ?

## 2022-02-16 IMAGING — US IR PICC >5YO
1 series · 2 of 2 positions shown · non-contrast
Comparison: none

INDICATION: Patient with history of osteomyelitis of left great toe in the
interval IV access for long-term IV antibiotic use. Request is made
for PICC line placement.

[Series 1: ir picc >5yo · 2 of 2 slices shown]
[im 1/2]
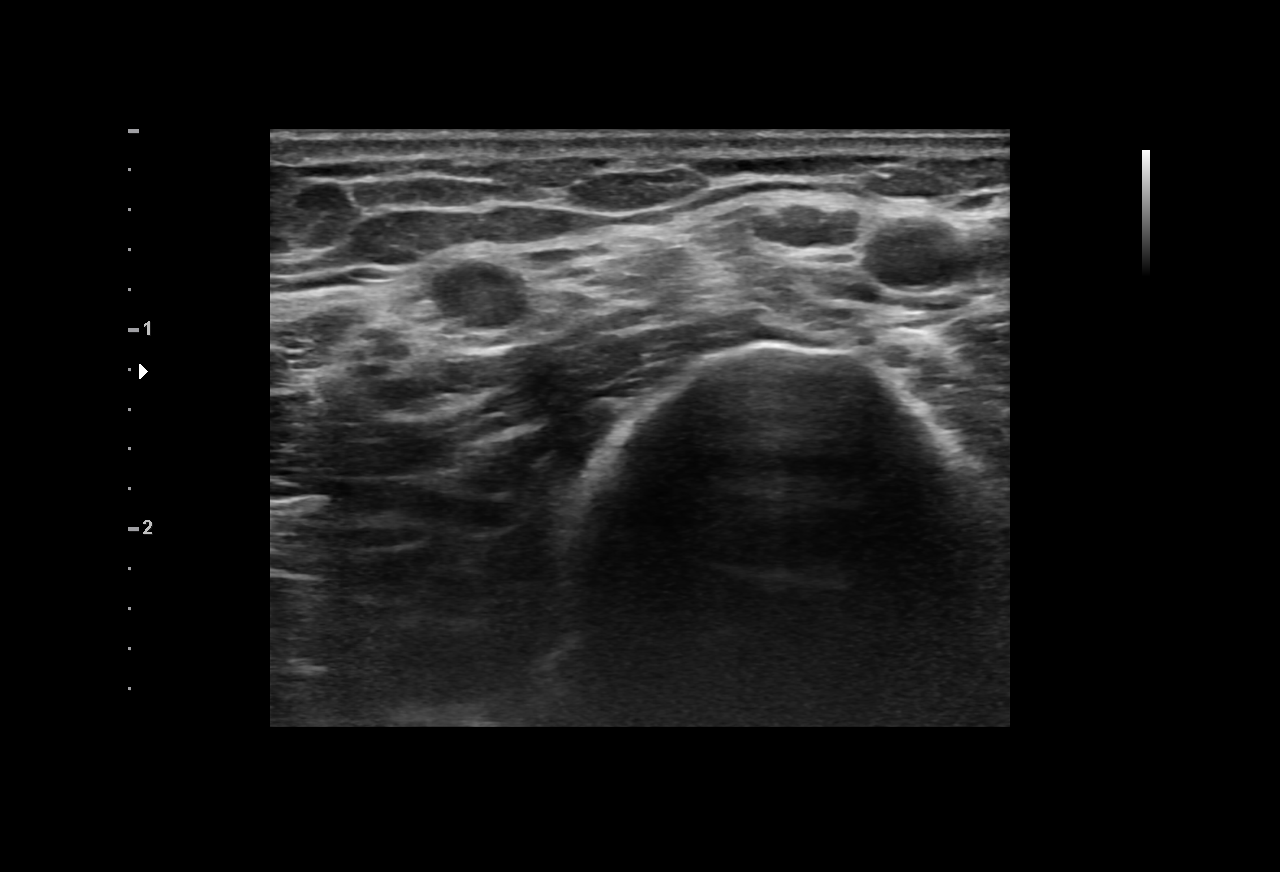
[im 2/2]
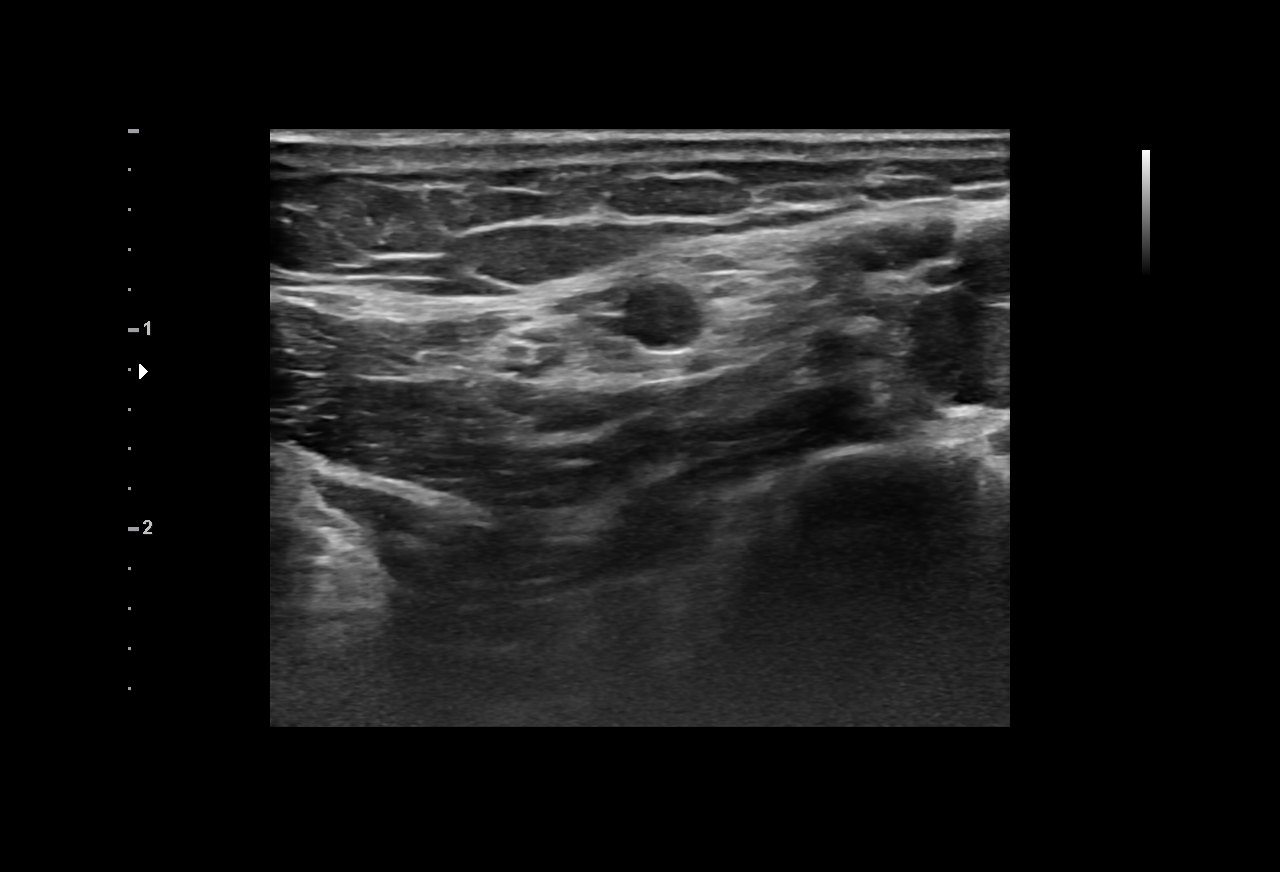

[2 of 2 positions shown; findings below may reference images not displayed]

EXAM:
LEFT PICC LINE PLACEMENT WITH ULTRASOUND AND FLUOROSCOPIC GUIDANCE

MEDICATIONS:
None

ANESTHESIA/SEDATION:
None

FLUOROSCOPY TIME:  Fluoroscopy Time: 24 seconds.

COMPLICATIONS:
None immediate.

PROCEDURE:
The patient was advised of the possible risks and complications and
agreed to undergo the procedure. The patient was then brought to the
angiographic suite for the procedure.

The left arm was prepped with chlorhexidine, draped in the usual
sterile fashion using maximum barrier technique (cap and mask,
sterile gown, sterile gloves, large sterile sheet, hand hygiene and
cutaneous antisepsis) and infiltrated locally with 1% Lidocaine.

Ultrasound demonstrated patency of the left basilic vein, and this
was documented with an image. Under real-time ultrasound guidance,
this vein was accessed with a 21 gauge micropuncture needle and
image documentation was performed. A [DATE] wire was introduced in to
the vein. Over this, a 5 French single lumen power-injectable PICC
was advanced to the lower SVC/right atrial junction. Fluoroscopy
during the procedure and fluoro spot radiograph confirms appropriate
catheter position. The catheter was flushed and covered with
asterile dressing.

Catheter length: 40 cm
IMPRESSION: Successful left arm power-injectable PICC line placement with
ultrasound and fluoroscopic guidance. The catheter is ready for use.

## 2022-02-21 DIAGNOSIS — E785 Hyperlipidemia, unspecified: Secondary | ICD-10-CM | POA: Diagnosis not present

## 2022-02-21 DIAGNOSIS — D509 Iron deficiency anemia, unspecified: Secondary | ICD-10-CM | POA: Diagnosis not present

## 2022-02-21 DIAGNOSIS — E1142 Type 2 diabetes mellitus with diabetic polyneuropathy: Secondary | ICD-10-CM | POA: Diagnosis not present

## 2022-02-21 DIAGNOSIS — D519 Vitamin B12 deficiency anemia, unspecified: Secondary | ICD-10-CM | POA: Diagnosis not present

## 2022-02-24 DIAGNOSIS — E1142 Type 2 diabetes mellitus with diabetic polyneuropathy: Secondary | ICD-10-CM | POA: Diagnosis not present

## 2022-02-24 DIAGNOSIS — D509 Iron deficiency anemia, unspecified: Secondary | ICD-10-CM | POA: Diagnosis not present

## 2022-02-24 DIAGNOSIS — D519 Vitamin B12 deficiency anemia, unspecified: Secondary | ICD-10-CM | POA: Diagnosis not present

## 2022-02-24 DIAGNOSIS — Z1331 Encounter for screening for depression: Secondary | ICD-10-CM | POA: Diagnosis not present

## 2022-02-24 DIAGNOSIS — Z9181 History of falling: Secondary | ICD-10-CM | POA: Diagnosis not present

## 2022-02-24 DIAGNOSIS — E785 Hyperlipidemia, unspecified: Secondary | ICD-10-CM | POA: Diagnosis not present

## 2022-02-24 DIAGNOSIS — I10 Essential (primary) hypertension: Secondary | ICD-10-CM | POA: Diagnosis not present

## 2022-02-28 ENCOUNTER — Encounter: Payer: Self-pay | Admitting: Podiatry

## 2022-02-28 ENCOUNTER — Ambulatory Visit (INDEPENDENT_AMBULATORY_CARE_PROVIDER_SITE_OTHER): Payer: Medicare Other | Admitting: Podiatry

## 2022-02-28 DIAGNOSIS — Z794 Long term (current) use of insulin: Secondary | ICD-10-CM

## 2022-02-28 DIAGNOSIS — E11610 Type 2 diabetes mellitus with diabetic neuropathic arthropathy: Secondary | ICD-10-CM

## 2022-02-28 DIAGNOSIS — L97522 Non-pressure chronic ulcer of other part of left foot with fat layer exposed: Secondary | ICD-10-CM

## 2022-02-28 DIAGNOSIS — E11621 Type 2 diabetes mellitus with foot ulcer: Secondary | ICD-10-CM | POA: Diagnosis not present

## 2022-02-28 DIAGNOSIS — E1169 Type 2 diabetes mellitus with other specified complication: Secondary | ICD-10-CM

## 2022-02-28 NOTE — Progress Notes (Signed)
?  Subjective:  ?Patient ID: James Norton., male    DOB: Jul 27, 1941,  MRN: [id number] ? ?Chief Complaint  ?Patient presents with  ? Callouses  ?  I have callus on the ball of the left foot and there is some occasionally neuropathy and no draining  ? ?81 y.o. male presents for follow-up of left foot wound. Has been dressing as instructed. Relates it is doing about the same. Denies any other complaints.  Last A1c was 7.6. Denies n/v/f/c.  ? ?PCP:  Associated at [place]  ? ? ?Objective:  ?Physical Exam: ?warm, good capillary refill, no trophic changes or ulcerative lesions, normal DP and PT pulses, and normal sensory exam. ?Left Foot: left midfoot without warmth at medial ankle, no erythema today. No crepitus. No loss of pedal arch, no rocker bottom deformity.  Left first metatarsal plantar ulcer noted with granular base measuring 0.3 cm x 1 cm x 0.2 cm. No erythema edema or purulence noted.  ?  ?Assessment:  ? ?1. Ulcer of left foot with fat layer exposed ([place])   ?2. Type 2 diabetes mellitus with other specified complication, with long-term current use of insulin ([place])   ?3. Charcot's joint arthropathy in type 2 diabetes mellitus ([place])   ?4. Diabetic ulcer of toe of left foot associated with type 2 diabetes mellitus, with fat layer exposed ([place])   ? ? ? ?Plan:  ?Patient was evaluated and treated and all questions answered. ? ?Charcot foot left ?-Remains quiescent.No Xrs today.  ?-Discussed signs of recurrence and prompt reporting of this ?Ulcer left first metatarsal with fat layer exposed  ?-Debridement as below. ?-Dressed with betadine, DSD. ?-Off-loading with surgical shoe. ?-No abx indicated.  ?-Discussed glucose control and proper protein-rich diet.  ?-Discussed if any worsening redness, pain, fever or chills to call or may need to report to the emergency room. Patient expressed understanding.  ? ?Procedure: Excisional Debridement of Wound ?Rationale: Removal of non-viable soft tissue from the wound  to promote healing.  ?Anesthesia: none ?Pre-Debridement Wound Measurements:  overlying callus  ?Post-Debridement Wound Measurements: 0.3cm x 1 cm x 2 cm  ?Type of Debridement: Sharp Excisional ?Tissue Removed: Non-viable soft tissue ?Depth of Debridement: subcutaneous tissue. ?Technique: Sharp excisional debridement to bleeding, viable wound base.  ?Dressing: Dry, sterile, compression dressing. ?Disposition: Patient tolerated procedure well. Patient to return in 2 week for follow-up. ? ?Return in about 2 weeks (around 03/14/2022) for wound check. ? ? ? ?Return in about 2 weeks (around 03/14/2022) for wound check.  ? ? ?

## 2022-03-14 ENCOUNTER — Ambulatory Visit (INDEPENDENT_AMBULATORY_CARE_PROVIDER_SITE_OTHER): Payer: Medicare Other | Admitting: Podiatry

## 2022-03-14 ENCOUNTER — Encounter: Payer: Self-pay | Admitting: Podiatry

## 2022-03-14 DIAGNOSIS — L97522 Non-pressure chronic ulcer of other part of left foot with fat layer exposed: Secondary | ICD-10-CM

## 2022-03-14 DIAGNOSIS — E1169 Type 2 diabetes mellitus with other specified complication: Secondary | ICD-10-CM

## 2022-03-14 DIAGNOSIS — E11610 Type 2 diabetes mellitus with diabetic neuropathic arthropathy: Secondary | ICD-10-CM

## 2022-03-14 DIAGNOSIS — Z794 Long term (current) use of insulin: Secondary | ICD-10-CM | POA: Diagnosis not present

## 2022-03-14 NOTE — Progress Notes (Signed)
?  Subjective:  ?Patient ID: James Norton., male    DOB: 06/08/1941,  MRN: [id number] ? ?No chief complaint on file. ? ?81 y.o. male presents for follow-up of left foot wound. Has been dressing as instructed. Relates doing well and continuing to drain. . Denies any other complaints.  Last A1c was 7.6. Denies n/v/f/c.  ? ?PCP:  Associated at [place]  ? ? ?Objective:  ?Physical Exam: ?warm, good capillary refill, no trophic changes or ulcerative lesions, normal DP and PT pulses, and normal sensory exam. ?Left Foot: left midfoot without warmth at medial ankle, no erythema today. No crepitus. No loss of pedal arch, no rocker bottom deformity.  Left first metatarsal plantar ulcer noted with granular base measuring 0.3 cm x 0.4 cm x 0.2 cm. No erythema edema or purulence noted.  ?  ?Assessment:  ? ?No diagnosis found. ? ? ? ?Plan:  ?Patient was evaluated and treated and all questions answered. ? ?Charcot foot left ?-Remains quiescent.No Xrs today.  ?-Discussed signs of recurrence and prompt reporting of this ?Ulcer left first metatarsal with fat layer exposed  ?-Debridement as below. ?-Dressed with betadine, DSD. ?-Off-loading with surgical shoe. ?-No abx indicated.  ?-Discussed glucose control and proper protein-rich diet.  ?-Discussed if any worsening redness, pain, fever or chills to call or may need to report to the emergency room. Patient expressed understanding.  ? ?Procedure: Excisional Debridement of Wound ?Rationale: Removal of non-viable soft tissue from the wound to promote healing.  ?Anesthesia: none ?Pre-Debridement Wound Measurements:  overlying callus  ?Post-Debridement Wound Measurements: 0.3cm x 0.4 cm x 2 cm  ?Type of Debridement: Sharp Excisional ?Tissue Removed: Non-viable soft tissue ?Depth of Debridement: subcutaneous tissue. ?Technique: Sharp excisional debridement to bleeding, viable wound base.  ?Dressing: Dry, sterile, compression dressing. ?Disposition: Patient tolerated procedure  well. Patient to return in 2 week for follow-up. ? ?No follow-ups on file. ? ? ? ?No follow-ups on file.  ? ? ?

## 2022-03-17 DIAGNOSIS — D519 Vitamin B12 deficiency anemia, unspecified: Secondary | ICD-10-CM | POA: Diagnosis not present

## 2022-03-28 ENCOUNTER — Ambulatory Visit (INDEPENDENT_AMBULATORY_CARE_PROVIDER_SITE_OTHER): Payer: Medicare Other | Admitting: Podiatry

## 2022-03-28 ENCOUNTER — Encounter: Payer: Self-pay | Admitting: Podiatry

## 2022-03-28 DIAGNOSIS — E1169 Type 2 diabetes mellitus with other specified complication: Secondary | ICD-10-CM

## 2022-03-28 DIAGNOSIS — L97522 Non-pressure chronic ulcer of other part of left foot with fat layer exposed: Secondary | ICD-10-CM | POA: Diagnosis not present

## 2022-03-28 DIAGNOSIS — Z794 Long term (current) use of insulin: Secondary | ICD-10-CM

## 2022-03-28 DIAGNOSIS — E11610 Type 2 diabetes mellitus with diabetic neuropathic arthropathy: Secondary | ICD-10-CM | POA: Diagnosis not present

## 2022-03-28 NOTE — Progress Notes (Signed)
?  Subjective:  ?Patient ID: James Pen., male    DOB: [birth year],  MRN: [id number] ? ?Chief Complaint  ?Patient presents with  ? Foot Ulcer  ?  The ball of the left foot is better and there is not any draining that I can tell and I have been using the betadine on it   ? ?81 y.o. male presents for follow-up of left foot wound. Has been dressing as instructed.Doing well no pain and no concerns for infection.  Denies any other complaints.  Last A1c was 7.6. Denies n/v/f/c.  ? ?PCP:  Associated at [place]  ? ? ?Objective:  ?Physical Exam: ?warm, good capillary refill, no trophic changes or ulcerative lesions, normal DP and PT pulses, and normal sensory exam. ?Left Foot: left midfoot without warmth at medial ankle, no erythema today. No crepitus. No loss of pedal arch, no rocker bottom deformity.  Left first metatarsal plantar ulcer noted with granular base measuring 0.3 cm x 0.4 cm x 0.2 cm. No erythema edema or purulence noted.  ?  ?Assessment:  ? ?1. Ulcer of left foot with fat layer exposed ([place])   ?2. Type 2 diabetes mellitus with other specified complication, with long-term current use of insulin ([place])   ?3. Charcot's joint arthropathy in type 2 diabetes mellitus ([place])   ? ? ? ? ?Plan:  ?Patient was evaluated and treated and all questions answered. ? ?Charcot foot left ?-Remains quiescent.No Xrs today.  ?-Discussed signs of recurrence and prompt reporting of this ?Ulcer left first metatarsal with fat layer exposed  ?-Debridement as below. ?-Dressed with betadine, DSD. ?-Off-loading with surgical shoe. ?-No abx indicated.  ?-Discussed glucose control and proper protein-rich diet.  ?-Discussed if any worsening redness, pain, fever or chills to call or may need to report to the emergency room. Patient expressed understanding.  ? ?Procedure: Excisional Debridement of Wound ?Rationale: Removal of non-viable soft tissue from the wound to promote healing.  ?Anesthesia: none ?Pre-Debridement Wound  Measurements:  overlying callus  ?Post-Debridement Wound Measurements: 0.3cm x 0.4 cm x 2 cm  ?Type of Debridement: Sharp Excisional ?Tissue Removed: Non-viable soft tissue ?Depth of Debridement: subcutaneous tissue. ?Technique: Sharp excisional debridement to bleeding, viable wound base.  ?Dressing: Dry, sterile, compression dressing. ?Disposition: Patient tolerated procedure well. Patient to return in 2 week for follow-up. ? ?Return in about 2 weeks (around 04/11/2022) for wound check. ? ? ? ?Return in about 2 weeks (around 04/11/2022) for wound check.  ? ? ?

## 2022-04-18 ENCOUNTER — Encounter: Payer: Self-pay | Admitting: Podiatry

## 2022-04-18 ENCOUNTER — Ambulatory Visit (INDEPENDENT_AMBULATORY_CARE_PROVIDER_SITE_OTHER): Payer: Medicare Other | Admitting: Podiatry

## 2022-04-18 DIAGNOSIS — L97522 Non-pressure chronic ulcer of other part of left foot with fat layer exposed: Secondary | ICD-10-CM | POA: Diagnosis not present

## 2022-04-18 DIAGNOSIS — Z794 Long term (current) use of insulin: Secondary | ICD-10-CM | POA: Diagnosis not present

## 2022-04-18 DIAGNOSIS — D519 Vitamin B12 deficiency anemia, unspecified: Secondary | ICD-10-CM | POA: Diagnosis not present

## 2022-04-18 DIAGNOSIS — E1169 Type 2 diabetes mellitus with other specified complication: Secondary | ICD-10-CM | POA: Diagnosis not present

## 2022-04-18 NOTE — Progress Notes (Signed)
  Subjective:  Patient ID: James Norton., male    DOB: [birth year],  MRN: [id number]  No chief complaint on file.  81 y.o. male presents for follow-up of left foot wound. Has been dressing as instructed.Doing well no pain and no concerns for infection.  Denies any other complaints.  Last A1c was 7.6. Denies n/v/f/c.   PCP:  Associated at [facility] medical    Objective:  Physical Exam: warm, good capillary refill, no trophic changes or ulcerative lesions, normal DP and PT pulses, and normal sensory exam. Left Foot: left midfoot without warmth at medial ankle, no erythema today. No crepitus. No loss of pedal arch, no rocker bottom deformity.  Left first metatarsal plantar ulcer noted with granular base measuring 0.3 cm x 0.3 cm x 0.2 cm. No erythema edema or purulence noted.    Assessment:   1. Ulcer of left foot with fat layer exposed ([place])   2. Type 2 diabetes mellitus with other specified complication, with long-term current use of insulin ([place])       Plan:  Patient was evaluated and treated and all questions answered.  Charcot foot left -Remains quiescent.No Xrs today.  -Discussed signs of recurrence and prompt reporting of this Ulcer left first metatarsal with fat layer exposed  -Debridement as below. -Dressed with betadine, DSD. -Off-loading with surgical shoe. -No abx indicated.  -Discussed glucose control and proper protein-rich diet.  -Discussed if any worsening redness, pain, fever or chills to call or may need to report to the emergency room. Patient expressed understanding.   Procedure: Excisional Debridement of Wound Rationale: Removal of non-viable soft tissue from the wound to promote healing.  Anesthesia: none Pre-Debridement Wound Measurements:  overlying callus  Post-Debridement Wound Measurements: 0.3cm x 0.3 cm x 2 cm  Type of Debridement: Sharp Excisional Tissue Removed: Non-viable soft tissue Depth of Debridement: subcutaneous tissue. Technique:  Sharp excisional debridement to bleeding, viable wound base.  Dressing: Dry, sterile, compression dressing. Disposition: Patient tolerated procedure well. Patient to return in 2 week for follow-up.  Return in about 2 weeks (around 05/02/2022) for wound check.    Return in about 2 weeks (around 05/02/2022) for wound check.

## 2022-05-02 ENCOUNTER — Ambulatory Visit (INDEPENDENT_AMBULATORY_CARE_PROVIDER_SITE_OTHER): Payer: Medicare Other | Admitting: Podiatry

## 2022-05-02 ENCOUNTER — Encounter: Payer: Self-pay | Admitting: Podiatry

## 2022-05-02 DIAGNOSIS — E1169 Type 2 diabetes mellitus with other specified complication: Secondary | ICD-10-CM

## 2022-05-02 DIAGNOSIS — L97522 Non-pressure chronic ulcer of other part of left foot with fat layer exposed: Secondary | ICD-10-CM

## 2022-05-02 DIAGNOSIS — Z794 Long term (current) use of insulin: Secondary | ICD-10-CM | POA: Diagnosis not present

## 2022-05-02 DIAGNOSIS — E11610 Type 2 diabetes mellitus with diabetic neuropathic arthropathy: Secondary | ICD-10-CM

## 2022-05-02 NOTE — Progress Notes (Signed)
  Subjective:  Patient ID: James Norton., male    DOB: 1941/08/20,  MRN: [id number]  Chief Complaint  Patient presents with   Foot Ulcer    It is better and has been swelling on the ball of the left foot    81 y.o. male presents for follow-up of left foot wound. Has been dressing as instructed.Doing well no pain and no concerns for infection. Relates left ankle has been swollen.  Denies any other complaints.  Last A1c was 7.6. Denies n/v/f/c.   PCP:  Associated at [facility] medical    Objective:  Physical Exam: warm, good capillary refill, no trophic changes or ulcerative lesions, normal DP and PT pulses, and normal sensory exam. Left Foot: left midfoot without warmth at medial ankle, no erythema today. No crepitus. No loss of pedal arch, no rocker bottom deformity.  Left first metatarsal plantar ulcer noted with granular base measuring 0.2 cm x 0.3 cm x 0.2 cm. No erythema edema or purulence noted.    Assessment:   1. Ulcer of left foot with fat layer exposed ([place])   2. Type 2 diabetes mellitus with other specified complication, with long-term current use of insulin ([facility])   3. Charcot's joint arthropathy in type 2 diabetes mellitus ([place])       Plan:  Patient was evaluated and treated and all questions answered.  Charcot foot left -Remains quiescent.No Xrs today.  -With increased warmth recommend going into CAM boot for time being until calms.  Ulcer left first metatarsal with fat layer exposed  -Debridement as below. -Dressed with betadine, DSD. -Off-loading with surgical shoe. -No abx indicated.  -Discussed glucose control and proper protein-rich diet.  -Discussed if any worsening redness, pain, fever or chills to call or may need to report to the emergency room. Patient expressed understanding.   Procedure: Excisional Debridement of Wound Rationale: Removal of non-viable soft tissue from the wound to promote healing.  Anesthesia: none Pre-Debridement Wound  Measurements:  overlying callus  Post-Debridement Wound Measurements: 0.2cm x 0.3 cm x 2 cm  Type of Debridement: Sharp Excisional Tissue Removed: Non-viable soft tissue Depth of Debridement: subcutaneous tissue. Technique: Sharp excisional debridement to bleeding, viable wound base.  Dressing: Dry, sterile, compression dressing. Disposition: Patient tolerated procedure well. Patient to return in 2 week for follow-up.  No follow-ups on file.    No follow-ups on file.

## 2022-05-19 DIAGNOSIS — D519 Vitamin B12 deficiency anemia, unspecified: Secondary | ICD-10-CM | POA: Diagnosis not present

## 2022-05-23 ENCOUNTER — Encounter: Payer: Self-pay | Admitting: Podiatry

## 2022-05-23 ENCOUNTER — Ambulatory Visit (INDEPENDENT_AMBULATORY_CARE_PROVIDER_SITE_OTHER): Payer: Medicare Other | Admitting: Podiatry

## 2022-05-23 DIAGNOSIS — L84 Corns and callosities: Secondary | ICD-10-CM | POA: Diagnosis not present

## 2022-05-23 DIAGNOSIS — Z794 Long term (current) use of insulin: Secondary | ICD-10-CM | POA: Diagnosis not present

## 2022-05-23 DIAGNOSIS — E11610 Type 2 diabetes mellitus with diabetic neuropathic arthropathy: Secondary | ICD-10-CM

## 2022-05-23 DIAGNOSIS — D519 Vitamin B12 deficiency anemia, unspecified: Secondary | ICD-10-CM | POA: Diagnosis not present

## 2022-05-23 DIAGNOSIS — D509 Iron deficiency anemia, unspecified: Secondary | ICD-10-CM | POA: Diagnosis not present

## 2022-05-23 DIAGNOSIS — E1169 Type 2 diabetes mellitus with other specified complication: Secondary | ICD-10-CM

## 2022-05-23 DIAGNOSIS — E1142 Type 2 diabetes mellitus with diabetic polyneuropathy: Secondary | ICD-10-CM | POA: Diagnosis not present

## 2022-05-23 DIAGNOSIS — E785 Hyperlipidemia, unspecified: Secondary | ICD-10-CM | POA: Diagnosis not present

## 2022-05-23 NOTE — Progress Notes (Signed)
  Subjective:  Patient ID: James Norton., male    DOB: [birth year],  MRN: [id number]  Chief Complaint  Patient presents with   Wound Check   81 y.o. male presents for follow-up of left foot wound. Has been dressing as instructed.Doing well no pain and no concerns for infection.Believes it has healed.   Denies any other complaints.  Last A1c was 7.6. Denies n/v/f/c.   PCP:  Associated at [facility] medical    Objective:  Physical Exam: warm, good capillary refill, no trophic changes or ulcerative lesions, normal DP and PT pulses, and normal sensory exam. Left Foot: left midfoot without warmth at medial ankle, no erythema today. No crepitus. No loss of pedal arch, no rocker bottom deformity.  Left first metatarsal plantar ulcer healed.    Assessment:   1. Type 2 diabetes mellitus with other specified complication, with long-term current use of insulin ([facility])   2. Charcot's joint arthropathy in type 2 diabetes mellitus ([place])   3. Pre-ulcerative calluses       Plan:  Patient was evaluated and treated and all questions answered.  Charcot foot left -Remains quiescent.No Xrs today.  -With increased warmth recommend going into CAM boot for time being until calms.  Ulcer left first metatarsal -healed -Debridement of hyperkeratotic tissue with ulcer healed.. -Dressed with betadine, DSD. -Off-loading with surgical shoe. -No abx indicated.  -Discussed glucose control and proper protein-rich diet.  -Discussed if any worsening redness, pain, fever or chills to call or may need to report to the emergency room. Patient expressed understanding.     Return in about 4 weeks (around 06/20/2022) for wound check.    Return in about 4 weeks (around 06/20/2022) for wound check.

## 2022-05-26 DIAGNOSIS — I10 Essential (primary) hypertension: Secondary | ICD-10-CM | POA: Diagnosis not present

## 2022-05-26 DIAGNOSIS — E785 Hyperlipidemia, unspecified: Secondary | ICD-10-CM | POA: Diagnosis not present

## 2022-05-26 DIAGNOSIS — D519 Vitamin B12 deficiency anemia, unspecified: Secondary | ICD-10-CM | POA: Diagnosis not present

## 2022-05-26 DIAGNOSIS — D509 Iron deficiency anemia, unspecified: Secondary | ICD-10-CM | POA: Diagnosis not present

## 2022-05-26 DIAGNOSIS — E1142 Type 2 diabetes mellitus with diabetic polyneuropathy: Secondary | ICD-10-CM | POA: Diagnosis not present

## 2022-05-30 DIAGNOSIS — E785 Hyperlipidemia, unspecified: Secondary | ICD-10-CM | POA: Diagnosis not present

## 2022-05-30 DIAGNOSIS — Z139 Encounter for screening, unspecified: Secondary | ICD-10-CM | POA: Diagnosis not present

## 2022-05-30 DIAGNOSIS — Z1331 Encounter for screening for depression: Secondary | ICD-10-CM | POA: Diagnosis not present

## 2022-05-30 DIAGNOSIS — Z9181 History of falling: Secondary | ICD-10-CM | POA: Diagnosis not present

## 2022-05-30 DIAGNOSIS — Z0000 Encounter for general adult medical examination without abnormal findings: Secondary | ICD-10-CM | POA: Diagnosis not present

## 2022-06-20 ENCOUNTER — Ambulatory Visit (INDEPENDENT_AMBULATORY_CARE_PROVIDER_SITE_OTHER): Payer: Medicare Other | Admitting: Podiatry

## 2022-06-20 ENCOUNTER — Encounter: Payer: Self-pay | Admitting: Podiatry

## 2022-06-20 DIAGNOSIS — L84 Corns and callosities: Secondary | ICD-10-CM | POA: Diagnosis not present

## 2022-06-20 DIAGNOSIS — Z794 Long term (current) use of insulin: Secondary | ICD-10-CM

## 2022-06-20 DIAGNOSIS — E11610 Type 2 diabetes mellitus with diabetic neuropathic arthropathy: Secondary | ICD-10-CM

## 2022-06-20 NOTE — Progress Notes (Signed)
  Subjective:  Patient ID: James Norton., male    DOB: 01/01/1941,  MRN: [id number]  Chief Complaint  Patient presents with   Diabetic Ulcer    4 week follow up left toe   81 y.o. male presents for follow-up of left foot wound. Has remained heeled and not had any issues.  Denies any other complaints.  Last A1c was 7.6. Denies n/v/f/c.   PCP:  Associated at [facility] medical    Objective:  Physical Exam: warm, good capillary refill, no trophic changes or ulcerative lesions, normal DP and PT pulses, and normal sensory exam. Left Foot: left midfoot without warmth at medial ankle, no erythema today. No crepitus. No loss of pedal arch, no rocker bottom deformity.  Left first metatarsal plantar ulcer healed.    Assessment:   1. Pre-ulcerative calluses   2. Type 2 diabetes mellitus with other specified complication, with long-term current use of insulin ([facility])   3. Charcot's joint arthropathy in type 2 diabetes mellitus ([place])       Plan:  Patient was evaluated and treated and all questions answered.  Charcot foot left -Remains quiescent.No Xrs today.  -With increased warmth recommend going into CAM boot for time being until calms.  Ulcer left first metatarsal -healed -Debridement of hyperkeratotic tissue with ulcer healed.. -No dressing necessary.  -Off-loading with diabetic shoes.  -No abx indicated.  -Discussed glucose control and proper protein-rich diet.  -Discussed if any worsening redness, pain, fever or chills to call or may need to report to the emergency room. Patient expressed understanding.  Will return in 6 weeks for rfc and will get on a three month schedule after that.     Return in about 6 weeks (around 08/01/2022) for rfc, wound check.    Return in about 6 weeks (around 08/01/2022) for rfc, wound check.

## 2022-06-21 DIAGNOSIS — D519 Vitamin B12 deficiency anemia, unspecified: Secondary | ICD-10-CM | POA: Diagnosis not present

## 2022-06-27 DIAGNOSIS — L821 Other seborrheic keratosis: Secondary | ICD-10-CM | POA: Diagnosis not present

## 2022-06-27 DIAGNOSIS — L578 Other skin changes due to chronic exposure to nonionizing radiation: Secondary | ICD-10-CM | POA: Diagnosis not present

## 2022-07-26 DIAGNOSIS — D519 Vitamin B12 deficiency anemia, unspecified: Secondary | ICD-10-CM | POA: Diagnosis not present

## 2022-08-03 ENCOUNTER — Ambulatory Visit (INDEPENDENT_AMBULATORY_CARE_PROVIDER_SITE_OTHER): Payer: Medicare Other | Admitting: Podiatry

## 2022-08-03 ENCOUNTER — Encounter: Payer: Self-pay | Admitting: Podiatry

## 2022-08-03 DIAGNOSIS — Z794 Long term (current) use of insulin: Secondary | ICD-10-CM | POA: Diagnosis not present

## 2022-08-03 DIAGNOSIS — L84 Corns and callosities: Secondary | ICD-10-CM | POA: Diagnosis not present

## 2022-08-03 DIAGNOSIS — E11610 Type 2 diabetes mellitus with diabetic neuropathic arthropathy: Secondary | ICD-10-CM

## 2022-08-03 DIAGNOSIS — E1169 Type 2 diabetes mellitus with other specified complication: Secondary | ICD-10-CM | POA: Diagnosis not present

## 2022-08-03 NOTE — Progress Notes (Signed)
  Subjective:  Patient ID: James Solo., male    DOB: 1941-02-23,  MRN: [id number]  Chief Complaint  Patient presents with   Foot Pain    Patient came in for a left hallux pain follow-up, patient is doing well denies any pain,    81 y.o. male presents for follow-up of left foot wound. Has remained heeled and not had any issues.  Denies any other complaints. Inquiring about new DM shoes.  Last A1c was 7.6. Denies n/v/f/c.   PCP:  [facility]    Objective:  Physical Exam: warm, good capillary refill, no trophic changes or ulcerative lesions, normal DP and PT pulses, and normal sensory exam. Left Foot: left midfoot without warmth at medial ankle, no erythema today. No crepitus. No loss of pedal arch, no rocker bottom deformity.  Left first metatarsal plantar ulcer healed.    Assessment:   1. Type 2 diabetes mellitus with other specified complication, with long-term current use of insulin (HCC)   2. Charcot's joint arthropathy in type 2 diabetes mellitus ([place])   3. Pre-ulcerative calluses       Plan:  Patient was evaluated and treated and all questions answered.  Charcot foot left -Remains quiescent.No Xrs today.  -With increased warmth recommend going into CAM boot for time being until calms.  Ulcer left first metatarsal -healed -No debridement necessary today as callus looking goo.  -No dressing necessary.  -Off-loading with diabetic shoes. Will schedule to get fitted for DM shoes.  -No abx indicated.  -Discussed glucose control and proper protein-rich diet.  -Discussed if any worsening redness, pain, fever or chills to call or may need to report to the emergency room. Patient expressed understanding.  Will return in 3 months for rfc.     Return in about 3 months (around 11/02/2022) for rfc.    Return in about 3 months (around 11/02/2022) for rfc.

## 2022-08-10 ENCOUNTER — Ambulatory Visit (INDEPENDENT_AMBULATORY_CARE_PROVIDER_SITE_OTHER): Payer: Medicare Other

## 2022-08-10 DIAGNOSIS — Z794 Long term (current) use of insulin: Secondary | ICD-10-CM

## 2022-08-10 DIAGNOSIS — E1169 Type 2 diabetes mellitus with other specified complication: Secondary | ICD-10-CM

## 2022-08-10 DIAGNOSIS — L97522 Non-pressure chronic ulcer of other part of left foot with fat layer exposed: Secondary | ICD-10-CM

## 2022-08-10 NOTE — Progress Notes (Signed)
Patient presents to the office today for diabetic shoe and insole measuring.  Patient was measured with brannock device to determine size and width for 1 pair of extra depth shoes and foam casted for 3 pair of insoles.   Documentation of medical necessity will be sent to patient's treating diabetic doctor to verify and sign.   Patient's diabetic provider: Nelda Bucks, MD  Shoes and insoles will be ordered at that time and patient will be notified for an appointment for fitting when they arrive.   Shoe size (per patient): 11 wide men's   Brannock measurement: 10.5 wide men's  Patient shoe selection-   Shoe choice:   [address] [employer]ace-up  Shoe size ordered: 11 wide men's

## 2022-08-29 DIAGNOSIS — D519 Vitamin B12 deficiency anemia, unspecified: Secondary | ICD-10-CM | POA: Diagnosis not present

## 2022-08-29 DIAGNOSIS — E1142 Type 2 diabetes mellitus with diabetic polyneuropathy: Secondary | ICD-10-CM | POA: Diagnosis not present

## 2022-08-29 DIAGNOSIS — D509 Iron deficiency anemia, unspecified: Secondary | ICD-10-CM | POA: Diagnosis not present

## 2022-08-29 DIAGNOSIS — Z125 Encounter for screening for malignant neoplasm of prostate: Secondary | ICD-10-CM | POA: Diagnosis not present

## 2022-08-29 DIAGNOSIS — E785 Hyperlipidemia, unspecified: Secondary | ICD-10-CM | POA: Diagnosis not present

## 2022-08-30 DIAGNOSIS — D519 Vitamin B12 deficiency anemia, unspecified: Secondary | ICD-10-CM | POA: Diagnosis not present

## 2022-09-01 DIAGNOSIS — Z23 Encounter for immunization: Secondary | ICD-10-CM | POA: Diagnosis not present

## 2022-09-01 DIAGNOSIS — I10 Essential (primary) hypertension: Secondary | ICD-10-CM | POA: Diagnosis not present

## 2022-09-01 DIAGNOSIS — D519 Vitamin B12 deficiency anemia, unspecified: Secondary | ICD-10-CM | POA: Diagnosis not present

## 2022-09-01 DIAGNOSIS — R972 Elevated prostate specific antigen [PSA]: Secondary | ICD-10-CM | POA: Diagnosis not present

## 2022-09-01 DIAGNOSIS — E785 Hyperlipidemia, unspecified: Secondary | ICD-10-CM | POA: Diagnosis not present

## 2022-09-01 DIAGNOSIS — D509 Iron deficiency anemia, unspecified: Secondary | ICD-10-CM | POA: Diagnosis not present

## 2022-09-01 DIAGNOSIS — E1142 Type 2 diabetes mellitus with diabetic polyneuropathy: Secondary | ICD-10-CM | POA: Diagnosis not present

## 2022-09-06 DIAGNOSIS — Z23 Encounter for immunization: Secondary | ICD-10-CM | POA: Diagnosis not present

## 2022-10-04 DIAGNOSIS — D519 Vitamin B12 deficiency anemia, unspecified: Secondary | ICD-10-CM | POA: Diagnosis not present

## 2022-10-05 ENCOUNTER — Other Ambulatory Visit: Payer: Self-pay

## 2022-10-10 ENCOUNTER — Ambulatory Visit (INDEPENDENT_AMBULATORY_CARE_PROVIDER_SITE_OTHER): Payer: Medicare Other | Admitting: Podiatry

## 2022-10-10 DIAGNOSIS — M2041 Other hammer toe(s) (acquired), right foot: Secondary | ICD-10-CM

## 2022-10-10 DIAGNOSIS — L84 Corns and callosities: Secondary | ICD-10-CM | POA: Diagnosis not present

## 2022-10-10 DIAGNOSIS — M2042 Other hammer toe(s) (acquired), left foot: Secondary | ICD-10-CM

## 2022-10-10 DIAGNOSIS — Z794 Long term (current) use of insulin: Secondary | ICD-10-CM

## 2022-10-10 DIAGNOSIS — E1169 Type 2 diabetes mellitus with other specified complication: Secondary | ICD-10-CM | POA: Diagnosis not present

## 2022-10-10 DIAGNOSIS — M1460 Charcot's joint, unspecified site: Secondary | ICD-10-CM

## 2022-10-10 DIAGNOSIS — L97522 Non-pressure chronic ulcer of other part of left foot with fat layer exposed: Secondary | ICD-10-CM

## 2022-10-10 NOTE — Patient Instructions (Signed)
Change inserts every 4 months. 1st pair was put in today, 2nd pair change in April, 3rd pair in August

## 2022-10-10 NOTE — Progress Notes (Signed)
Patient presents today to pick up diabetic shoe prescribed by Dr. Blenda Mounts.   Diabetic shoes and inserts were dispensed and fit was satisfactory. Reviewed instructions for break-in and wear. Written instructions given to patient.  Patient will follow up as needed.

## 2022-10-31 ENCOUNTER — Ambulatory Visit (INDEPENDENT_AMBULATORY_CARE_PROVIDER_SITE_OTHER): Payer: Medicare Other | Admitting: Podiatry

## 2022-10-31 DIAGNOSIS — E1169 Type 2 diabetes mellitus with other specified complication: Secondary | ICD-10-CM | POA: Diagnosis not present

## 2022-10-31 DIAGNOSIS — E1140 Type 2 diabetes mellitus with diabetic neuropathy, unspecified: Secondary | ICD-10-CM

## 2022-10-31 DIAGNOSIS — E11610 Type 2 diabetes mellitus with diabetic neuropathic arthropathy: Secondary | ICD-10-CM | POA: Diagnosis not present

## 2022-10-31 DIAGNOSIS — Z794 Long term (current) use of insulin: Secondary | ICD-10-CM | POA: Diagnosis not present

## 2022-10-31 NOTE — Progress Notes (Signed)
  Subjective:  Patient ID: James Norton., male    DOB: March 05, 1941,  MRN: [id number]  Chief Complaint  Patient presents with   Follow-up    Charcot foot     81 y.o. male presents with the above complaint. History confirmed with patient. Patient presenting for follow-up of Charcot arthropathy left foot.  He denies any significant changes since prior visit.  Has been wearing good supportive work boots and custom shoes.  No issues.  Denies any calluses or pain in the left foot.  Objective:  Physical Exam: warm, good capillary refill nail exam onychomycosis of the toenails, onycholysis, and dystrophic nails DP pulses palpable, PT pulses palpable, and protective sensation absent Left Foot:  Nails of normal length, no issues. No pre ulcerative calluses, no rocker bottom deformity or gross deformity of the left foot/ankle Right Foot: Nails of normal length.   Assessment:   1. Charcot's joint arthropathy in type 2 diabetes mellitus ([place])   2. Diabetic neuropathy, painful ([place])   3. Type 2 diabetes mellitus with other specified complication, with long-term current use of insulin ([place])      Plan:  Patient was evaluated and treated and all questions answered.  #Charcot Left foot -Stable at this visit.  No gross changes in foot structure or shape. -Continue to monitor for foot deformity. -Patient will follow-up in 6 months for repeat examination. -Monitor for painful lesions or worsening deformity of the left foot call with any questions or concerns arise.  Return in about 6 months (around 05/02/2023) for Routine DM/ follow up [place].         Everitt Amber, DPM [facility]

## 2022-11-08 DIAGNOSIS — D519 Vitamin B12 deficiency anemia, unspecified: Secondary | ICD-10-CM | POA: Diagnosis not present

## 2022-11-09 DIAGNOSIS — E119 Type 2 diabetes mellitus without complications: Secondary | ICD-10-CM | POA: Diagnosis not present

## 2022-11-13 DIAGNOSIS — I10 Essential (primary) hypertension: Secondary | ICD-10-CM | POA: Diagnosis not present

## 2022-11-13 DIAGNOSIS — E785 Hyperlipidemia, unspecified: Secondary | ICD-10-CM | POA: Diagnosis not present

## 2022-11-13 DIAGNOSIS — E1142 Type 2 diabetes mellitus with diabetic polyneuropathy: Secondary | ICD-10-CM | POA: Diagnosis not present

## 2022-11-30 DIAGNOSIS — D509 Iron deficiency anemia, unspecified: Secondary | ICD-10-CM | POA: Diagnosis not present

## 2022-11-30 DIAGNOSIS — R972 Elevated prostate specific antigen [PSA]: Secondary | ICD-10-CM | POA: Diagnosis not present

## 2022-11-30 DIAGNOSIS — E785 Hyperlipidemia, unspecified: Secondary | ICD-10-CM | POA: Diagnosis not present

## 2022-11-30 DIAGNOSIS — D519 Vitamin B12 deficiency anemia, unspecified: Secondary | ICD-10-CM | POA: Diagnosis not present

## 2022-11-30 DIAGNOSIS — E1142 Type 2 diabetes mellitus with diabetic polyneuropathy: Secondary | ICD-10-CM | POA: Diagnosis not present

## 2022-12-06 DIAGNOSIS — I10 Essential (primary) hypertension: Secondary | ICD-10-CM | POA: Diagnosis not present

## 2022-12-06 DIAGNOSIS — D509 Iron deficiency anemia, unspecified: Secondary | ICD-10-CM | POA: Diagnosis not present

## 2022-12-06 DIAGNOSIS — E785 Hyperlipidemia, unspecified: Secondary | ICD-10-CM | POA: Diagnosis not present

## 2022-12-06 DIAGNOSIS — R972 Elevated prostate specific antigen [PSA]: Secondary | ICD-10-CM | POA: Diagnosis not present

## 2022-12-06 DIAGNOSIS — E1142 Type 2 diabetes mellitus with diabetic polyneuropathy: Secondary | ICD-10-CM | POA: Diagnosis not present

## 2022-12-06 DIAGNOSIS — D519 Vitamin B12 deficiency anemia, unspecified: Secondary | ICD-10-CM | POA: Diagnosis not present

## 2022-12-12 ENCOUNTER — Other Ambulatory Visit: Payer: Medicare Other

## 2022-12-12 ENCOUNTER — Encounter: Payer: Medicare Other | Admitting: Oncology

## 2022-12-12 DIAGNOSIS — H401131 Primary open-angle glaucoma, bilateral, mild stage: Secondary | ICD-10-CM | POA: Diagnosis not present

## 2022-12-13 ENCOUNTER — Encounter: Payer: Self-pay | Admitting: Oncology

## 2022-12-13 ENCOUNTER — Inpatient Hospital Stay: Payer: Medicare Other

## 2022-12-13 ENCOUNTER — Inpatient Hospital Stay: Payer: Medicare Other | Attending: Oncology | Admitting: Oncology

## 2022-12-13 VITALS — BP 179/70 | HR 66 | Temp 97.4°F | Resp 14 | Ht 67.0 in | Wt 179.4 lb

## 2022-12-13 DIAGNOSIS — D519 Vitamin B12 deficiency anemia, unspecified: Secondary | ICD-10-CM | POA: Diagnosis not present

## 2022-12-13 DIAGNOSIS — Z789 Other specified health status: Secondary | ICD-10-CM | POA: Diagnosis not present

## 2022-12-13 DIAGNOSIS — D539 Nutritional anemia, unspecified: Secondary | ICD-10-CM

## 2022-12-13 DIAGNOSIS — I10 Essential (primary) hypertension: Secondary | ICD-10-CM | POA: Diagnosis not present

## 2022-12-13 DIAGNOSIS — D649 Anemia, unspecified: Secondary | ICD-10-CM | POA: Diagnosis not present

## 2022-12-13 DIAGNOSIS — Z7982 Long term (current) use of aspirin: Secondary | ICD-10-CM

## 2022-12-13 DIAGNOSIS — E785 Hyperlipidemia, unspecified: Secondary | ICD-10-CM | POA: Diagnosis not present

## 2022-12-13 DIAGNOSIS — D126 Benign neoplasm of colon, unspecified: Secondary | ICD-10-CM

## 2022-12-13 DIAGNOSIS — E11621 Type 2 diabetes mellitus with foot ulcer: Secondary | ICD-10-CM | POA: Diagnosis not present

## 2022-12-13 LAB — TECHNOLOGIST SMEAR REVIEW: Plt Morphology: ADEQUATE

## 2022-12-13 LAB — CMP (CANCER CENTER ONLY)
ALT: 15 U/L (ref 0–44)
AST: 21 U/L (ref 15–41)
Albumin: 4 g/dL (ref 3.5–5.0)
Alkaline Phosphatase: 112 U/L (ref 38–126)
Anion gap: 11 (ref 5–15)
BUN: 17 mg/dL (ref 8–23)
CO2: 24 mmol/L (ref 22–32)
Calcium: 9.4 mg/dL (ref 8.9–10.3)
Chloride: 102 mmol/L (ref 98–111)
Creatinine: 0.95 mg/dL (ref 0.61–1.24)
GFR, Estimated: >60 mL/min (ref >60)
Glucose, Bld: 147 mg/dL — ABNORMAL HIGH (ref 70–99)
Potassium: 3.9 mmol/L (ref 3.5–5.1)
Sodium: 137 mmol/L (ref 135–145)
Total Bilirubin: 0.4 mg/dL (ref 0.3–1.2)
Total Protein: 8.3 g/dL — ABNORMAL HIGH (ref 6.5–8.1)

## 2022-12-13 LAB — CBC WITH DIFFERENTIAL/PLATELET
Abs Immature Granulocytes: 0.02 10*3/uL (ref 0.00–0.07)
Basophils Absolute: 0.1 10*3/uL (ref 0.0–0.1)
Basophils Relative: 1 %
Eosinophils Absolute: 0.1 10*3/uL (ref 0.0–0.5)
Eosinophils Relative: 1 %
HCT: 39.7 % (ref 39.0–52.0)
Hemoglobin: 12 g/dL — ABNORMAL LOW (ref 13.0–17.0)
Immature Granulocytes: 0 %
Lymphocytes Relative: 10 %
Lymphs Abs: 0.9 10*3/uL (ref 0.7–4.0)
MCH: 23.8 pg — ABNORMAL LOW (ref 26.0–34.0)
MCHC: 30.2 g/dL (ref 30.0–36.0)
MCV: 78.6 fL — ABNORMAL LOW (ref 80.0–100.0)
Monocytes Absolute: 0.6 10*3/uL (ref 0.1–1.0)
Monocytes Relative: 6 %
Neutro Abs: 7.3 10*3/uL (ref 1.7–7.7)
Neutrophils Relative %: 82 %
Platelets: 394 10*3/uL (ref 150–400)
RBC: 5.05 MIL/uL (ref 4.22–5.81)
RDW: 16.8 % — ABNORMAL HIGH (ref 11.5–15.5)
WBC: 8.9 10*3/uL (ref 4.0–10.5)
nRBC: 0 % (ref 0.0–0.2)

## 2022-12-13 LAB — RETICULOCYTES
Immature Retic Fract: 14.1 % (ref 2.3–15.9)
RBC.: 5.04 MIL/uL (ref 4.22–5.81)
Retic Count, Absolute: 85.2 10*3/uL (ref 19.0–186.0)
Retic Ct Pct: 1.7 % (ref 0.4–3.1)

## 2022-12-13 LAB — FOLATE: Folate: 16.4 ng/mL (ref >5.9)

## 2022-12-13 LAB — VITAMIN B12: Vitamin B-12: >7500 pg/mL — ABNORMAL HIGH (ref 180–914)

## 2022-12-13 LAB — FERRITIN: Ferritin: 20 ng/mL — ABNORMAL LOW (ref 24–336)

## 2022-12-13 LAB — DIRECT ANTIGLOBULIN TEST (NOT AT ARMC)
DAT, IgG: NEGATIVE
DAT, complement: NEGATIVE

## 2022-12-13 NOTE — Progress Notes (Signed)
James Norton Cancer Initial Visit:  Patient Care Team: Nicoletta Dress, MD as PCP - General (Internal Medicine)  CHIEF COMPLAINTS/PURPOSE OF CONSULTATION:  HISTORY OF PRESENTING ILLNESS: James Norton. 82 y.o. male is here because of anemia Medical history notable for diabetes mellitus type 2, Charcot joint, hyperlipidemia, hypertension, diabetic foot ulcer allergic rhinitis  February 21, 2022 WBC 6.0 hemoglobin 12.9 MCV 82 platelet count 273 normal differential  August 29, 2022 WBC 8.3 hemoglobin 12.2 MCV 80 platelet count 368 normal differential  December 01, 2022 WBC 10.0 hemoglobin 11.6 MCV 76 platelet count 4 9; 79 segs 11 lymphs 8 monos 1 EO 1 basophil Iron saturation 8% B12 433 Hemoglobin A1c 8.7 CMP notable for creatinine 0.82  December 13, 2022: [name]  Has been taking oral iron for a couple of years.  Has never received IV iron nor required PRBC's in the past.   Has  tolerated oral iron.  Has a normal diet.  No history of hemorrhage postoperatively requiring transfusion.   No hematochezia, melena, hemoptysis, hematuria.   No /has history of intra-articular or soft tissue bleeding.  Is not taking oral anticoagulants.  Takes ASA daily.  Takes Ibuprofen PRN for neuropathy.  No history of abnormal bleeding in family members.  Patient does not have fatigue, pallor,  DOE, decreased performance status, pica to ice/starch/dirt.    Last colonoscopy was about 5 yrs ago which was negative for polyps.  On previous colonoscopies he had polyps which were removed.  He has not undergone an EGD.  Social:  Married.  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  Tobacco none.  EtOH none.    Review of Systems - Oncology  MEDICAL HISTORY: Past Medical History:  Diagnosis Date   B12 deficiency anemia    Colon polyps    Diabetes ([place])    Diabetic foot ulcer ([place])    Diverticulosis    Hyperlipidemia    Hypertension    Iron deficiency anemia     SURGICAL  HISTORY: Past Surgical History:  Procedure Laterality Date   COLONOSCOPY     left metatarsal resection  09/10/2020   left sesamoidectomy  07/09/2020   TONSILLECTOMY      SOCIAL HISTORY: Social History   Socioeconomic History   Marital status: Married    Spouse name: Not on file   Number of children: Not on file   Years of education: Not on file   Highest education level: Not on file  Occupational History   Not on file  Tobacco Use   Smoking status: Never   Smokeless tobacco: Never  Vaping Use   Vaping Use: Never used  Substance and Sexual Activity   Alcohol use: Not Currently   Drug use: Never   Sexual activity: Not on file  Other Topics Concern   Not on file  Social History Narrative   Not on file   Social Determinants of Health   Financial Resource Strain: Not on file  Food Insecurity: No Food Insecurity (12/13/2022)   Hunger Vital Sign    Worried About Running Out of Food in the Last Year: Never true    Ran Out of Food in the Last Year: Never true  Transportation Needs: No Transportation Needs (12/13/2022)   PRAPARE - Hydrologist (Medical): No    Lack of Transportation (Non-Medical): No  Physical Activity: Not on file  Stress: Not on file  Social Connections: Not on file  Intimate Partner Violence: Not  At Risk (12/13/2022)   Humiliation, Afraid, Rape, and Kick questionnaire    Fear of Current or Ex-Partner: No    Emotionally Abused: No    Physically Abused: No    Sexually Abused: No    FAMILY HISTORY Family History  Problem Relation Age of Onset   Bone cancer Mother    Hypertension Father    Stroke Father    Diabetes Brother    Breast cancer Paternal Grandmother    Diabetes Daughter     ALLERGIES:  has No Known Allergies.  MEDICATIONS:  Current Outpatient Medications  Medication Sig Dispense Refill   amLODipine (NORVASC) 5 MG tablet      atorvastatin (LIPITOR) 80 MG tablet      Cyanocobalamin (B-12 COMPLIANCE  INJECTION IJ) Inject as directed.     ferrous sulfate 325 (65 FE) MG EC tablet Take 325 mg by mouth in the morning and at bedtime.     ibuprofen (ADVIL) 800 MG tablet Take 800 mg by mouth 3 (three) times daily.     insulin degludec (TRESIBA FLEXTOUCH) 100 UNIT/ML FlexTouch Pen Inject 45 Units into the skin at bedtime.     lisinopril (ZESTRIL) 20 MG tablet      metFORMIN (GLUCOPHAGE-XR) 500 MG 24 hr tablet Take 500 mg by mouth 2 (two) times daily with a meal.     finasteride (PROSCAR) 5 MG tablet Take 5 mg by mouth daily.     tamsulosin (FLOMAX) 0.4 MG CAPS capsule Take 0.4 mg by mouth daily.     No current facility-administered medications for this visit.    PHYSICAL EXAMINATION:  ECOG PERFORMANCE STATUS: 1 - Symptomatic but completely ambulatory   Vitals:   12/13/22 1123  BP: (!) 179/70  Pulse: 66  Resp: 14  Temp: (!) 97.4 F (36.3 C)  SpO2: 99%    Filed Weights   12/13/22 1123  Weight: 179 lb 6.4 oz (81.4 kg)     Physical Exam Vitals and nursing note reviewed.  Constitutional:      General: He is not in acute distress.    Appearance: Normal appearance. He is normal weight. He is not ill-appearing or diaphoretic.  HENT:     Head: Normocephalic and atraumatic.     Right Ear: External ear normal.     Left Ear: External ear normal.     Nose: Nose normal.  Eyes:     General: No scleral icterus.    Conjunctiva/sclera: Conjunctivae normal.     Pupils: Pupils are equal, round, and reactive to light.  Cardiovascular:     Rate and Rhythm: Normal rate and regular rhythm.     Heart sounds:     No friction rub. No gallop.  Pulmonary:     Effort: Pulmonary effort is normal. No respiratory distress.     Breath sounds: Normal breath sounds. No stridor. No wheezing.  Abdominal:     Palpations: Abdomen is soft.     Tenderness: There is no abdominal tenderness. There is no guarding or rebound.  Musculoskeletal:        General: No deformity or signs of injury.     Cervical  back: Normal range of motion and neck supple. No rigidity or tenderness.     Right lower leg: No edema.     Left lower leg: No edema.  Lymphadenopathy:     Head:     Right side of head: No submental, submandibular, tonsillar, preauricular, posterior auricular or occipital adenopathy.     Left side  of head: No submental, submandibular, tonsillar, preauricular, posterior auricular or occipital adenopathy.     Cervical: No cervical adenopathy.     Right cervical: No superficial, deep or posterior cervical adenopathy.    Left cervical: No superficial, deep or posterior cervical adenopathy.     Upper Body:     Right upper body: No supraclavicular or axillary adenopathy.     Left upper body: No supraclavicular or axillary adenopathy.     Lower Body: No right inguinal adenopathy. No left inguinal adenopathy.  Skin:    General: Skin is warm.     Coloration: Skin is not jaundiced.     Findings: No bruising.  Neurological:     General: No focal deficit present.     Mental Status: He is alert and oriented to person, place, and time.     Cranial Nerves: No cranial nerve deficit.  Psychiatric:        Mood and Affect: Mood normal.        Behavior: Behavior normal.        Thought Content: Thought content normal.        Judgment: Judgment normal.      LABORATORY DATA: I have personally reviewed the data as listed:  Appointment on 12/13/2022  Component Date Value Ref Range Status   WBC MORPHOLOGY 12/13/2022 MORPHOLOGY UNREMARKABLE   Final   RBC MORPHOLOGY 12/13/2022 BURR CELLS   Final   Comment: Acanthocytes present OVALOCYTES POLYCHROMASIA PRESENT    Plt Morphology 12/13/2022 PLATELETS APPEAR ADEQUATE   Final   MORPHOLOGY UNREMARKABLE   Clinical Information 12/13/2022 anemia   Final   Performed at [facility], [place] [address] [place], [place] [address]   Sodium 12/13/2022 137  135 - 145 mmol/L Final   Potassium 12/13/2022 3.9  3.5 - 5.1 mmol/L Final   Chloride  12/13/2022 102  98 - 111 mmol/L Final   CO2 12/13/2022 24  22 - 32 mmol/L Final   Glucose, Bld 12/13/2022 147 (H)  70 - 99 mg/dL Final   Glucose reference range applies only to samples taken after fasting for at least 8 hours.   BUN 12/13/2022 17  8 - 23 mg/dL Final   Creatinine 12/13/2022 0.95  0.61 - 1.24 mg/dL Final   Calcium 12/13/2022 9.4  8.9 - 10.3 mg/dL Final   Total Protein 12/13/2022 8.3 (H)  6.5 - 8.1 g/dL Final   Albumin 12/13/2022 4.0  3.5 - 5.0 g/dL Final   AST 12/13/2022 21  15 - 41 U/L Final   ALT 12/13/2022 15  0 - 44 U/L Final   Alkaline Phosphatase 12/13/2022 112  38 - 126 U/L Final   Total Bilirubin 12/13/2022 0.4  0.3 - 1.2 mg/dL Final   GFR, Estimated 12/13/2022 >60  >60 mL/min Final   Comment: (NOTE) Calculated using the CKD-EPI Creatinine Equation (2021)    Anion gap 12/13/2022 11  5 - 15 Final   Performed at [facility], [place] [address], [place], [place] [address]   Vitamin B-12 12/13/2022 >7,500 (H)  180 - 914 pg/mL Final   Comment: RESULT CONFIRMED BY MANUAL DILUTION (NOTE) This assay is not validated for testing neonatal or myeloproliferative syndrome specimens for Vitamin B12 levels. Performed at [facility], [place] [address] [place], [place] [address]    Retic Ct Pct 12/13/2022 1.7  0.4 - 3.1 % Final   RBC. 12/13/2022 5.04  4.22 - 5.81 MIL/uL Final   Retic Count, Absolute 12/13/2022 85.2  19.0 - 186.0 K/uL Final   Immature Retic Fract 12/13/2022 14.1  2.3 - 15.9 % Final   Performed at [facility], [place] [address] [place], [place] [address]   Kappa free light chain 12/13/2022 47.5 (H)  3.3 - 19.4 mg/L Final   Lambda free light chains 12/13/2022 27.4 (H)  5.7 - 26.3 mg/L Final   Kappa, lambda light chain ratio 12/13/2022 1.73 (H)  0.26 - 1.65 Final   Comment: (NOTE) Performed At: [facility] [address] [place], [place] [id number] Rush Farmer MD YI:[id number]    DAT, complement  12/13/2022 NEG   Final   DAT, IgG 12/13/2022    Final                   Value:NEG Performed at [facility], [place] [address], [place], [place] [address]    Copper 12/13/2022 135 (H)  69 - 132 ug/dL Final   Comment: (NOTE) This test was developed and its performance characteristics determined by [facility]. It has not been cleared or approved by the Food and Drug Administration.                                Detection Limit = 5 Performed At: [facility] [address] [place], [place] [id number] Rush Farmer MD IR:[id number]    Zinc 12/13/2022 55  44 - 115 ug/dL Final   Comment: (NOTE) This test was developed and its performance characteristics determined by [facility]. It has not been cleared or approved by the Food and Drug Administration.                                Detection Limit = 5 Performed At: [facility] [address] [place], [place] [id number] Rush Farmer MD PO:[id number]    Haptoglobin 12/13/2022 234  34 - 355 mg/dL Final   Comment: (NOTE) Performed At: [facility] [place], [place] [id number] Rush Farmer MD YP:[id number]    Folate 12/13/2022 16.4  >5.9 ng/mL Final   Performed at [facility], [place] [address], [place], [place] [address]   Ferritin 12/13/2022 20 (L)  24 - 336 ng/mL Final   Performed at [facility], [place] [address] [place], [place] [address]  Office Visit on 12/13/2022  Component Date Value Ref Range Status   WBC 12/13/2022 8.9  4.0 - 10.5 K/uL Final   RBC 12/13/2022 5.05  4.22 - 5.81 MIL/uL Final   Hemoglobin 12/13/2022 12.0 (L)  13.0 - 17.0 g/dL Final   HCT 12/13/2022 39.7  39.0 - 52.0 % Final   MCV 12/13/2022 78.6 (L)  80.0 - 100.0 fL Final   MCH 12/13/2022 23.8 (L)  26.0 - 34.0 pg Final   MCHC 12/13/2022 30.2  30.0 - 36.0 g/dL Final   RDW 12/13/2022 16.8 (H)  11.5 - 15.5 % Final   Platelets 12/13/2022 394  150 - 400 K/uL Final   nRBC  12/13/2022 0.0  0.0 - 0.2 % Final   Neutrophils Relative % 12/13/2022 82  % Final   Neutro Abs 12/13/2022 7.3  1.7 - 7.7 K/uL Final   Lymphocytes Relative 12/13/2022 10  % Final   Lymphs Abs 12/13/2022 0.9  0.7 - 4.0 K/uL Final   Monocytes Relative 12/13/2022 6  % Final   Monocytes Absolute 12/13/2022 0.6  0.1 -  1.0 K/uL Final   Eosinophils Relative 12/13/2022 1  % Final   Eosinophils Absolute 12/13/2022 0.1  0.0 - 0.5 K/uL Final   Basophils Relative 12/13/2022 1  % Final   Basophils Absolute 12/13/2022 0.1  0.0 - 0.1 K/uL Final   Immature Granulocytes 12/13/2022 0  % Final   Abs Immature Granulocytes 12/13/2022 0.02  0.00 - 0.07 K/uL Final   Performed at [facility], [place] [address] [place], [place] [address]    RADIOGRAPHIC STUDIES: I have personally reviewed the radiological images as listed and agree with the findings in the report  No results found.  ASSESSMENT/PLAN   82 y.o. male with edical history notable for diabetes mellitus type 2, Charcot joint, hyperlipidemia, hypertension, diabetic foot ulcer allergic rhinitis.  Patient seen for evaluation and management of anemia  Anemia:  Normocytic and gradually worsening despite taking oral iron.  Patient has history of colon polyps.  He is also on ASA daily.  Probability is high for occult GI blood loss.   Obtain CBC with diff, CMP, retic count, Ferritin B12, folate, DAT, Haptoglobin, SPEP with IEP, free light chains and smear for morphology review, Copper and Zinc levels.    Recommendations to be based on results of laboratory studies but likely to include endoscopy.  Patient has never undergone an EGD.      Cancer Staging  No matching staging information was found for the patient.   No problem-specific Assessment & Plan notes found for this encounter.   Orders Placed This Encounter  Procedures   Ferritin    Standing Status:   Future    Number of Occurrences:   1    Standing Expiration Date:    12/14/2023   Folate    Standing Status:   Future    Number of Occurrences:   1    Standing Expiration Date:   12/14/2023   Haptoglobin    Standing Status:   Future    Number of Occurrences:   1    Standing Expiration Date:   12/14/2023   Zinc    Standing Status:   Future    Number of Occurrences:   1    Standing Expiration Date:   12/14/2023   Copper, serum    Standing Status:   Future    Number of Occurrences:   1    Standing Expiration Date:   12/14/2023   Kappa/lambda light chains    Standing Status:   Future    Number of Occurrences:   1    Standing Expiration Date:   12/14/2023   Multiple Myeloma Panel (SPEP&IFE w/QIG)    Standing Status:   Future    Number of Occurrences:   1    Standing Expiration Date:   12/14/2023   Reticulocytes    Standing Status:   Future    Number of Occurrences:   1    Standing Expiration Date:   12/14/2023   Vitamin B12    Standing Status:   Future    Number of Occurrences:   1    Standing Expiration Date:   12/14/2023   CBC with Differential/Platelet   CMP ([place] only)    Standing Status:   Future    Number of Occurrences:   1    Standing Expiration Date:   12/14/2023   Technologist smear review    Standing Status:   Future    Number of Occurrences:   1    Standing Expiration  Date:   12/14/2023    Order Specific Question:   Clinical information:    Answer:   anemia   Direct antiglobulin test (not at [facility])    Standing Status:   Future    Number of Occurrences:   1    Standing Expiration Date:   12/14/2023    46  minutes was spent in patient care.  This included time spent preparing to see the patient (e.g., review of tests), obtaining and/or reviewing separately obtained history, counseling and educating the patient/family/caregiver, ordering medications, tests, or procedures; documenting clinical information in the electronic or other health record, independently interpreting results and communicating results to the  patient/family/caregiver as well as coordination of care.      All questions were answered. The patient knows to call the clinic with any problems, questions or concerns.  This note was electronically signed.    Barbee Cough, MD  12/19/2022 4:37 PM

## 2022-12-14 DIAGNOSIS — R972 Elevated prostate specific antigen [PSA]: Secondary | ICD-10-CM | POA: Diagnosis not present

## 2022-12-14 DIAGNOSIS — Z79899 Other long term (current) drug therapy: Secondary | ICD-10-CM | POA: Diagnosis not present

## 2022-12-14 DIAGNOSIS — N401 Enlarged prostate with lower urinary tract symptoms: Secondary | ICD-10-CM | POA: Diagnosis not present

## 2022-12-14 LAB — KAPPA/LAMBDA LIGHT CHAINS
Kappa free light chain: 47.5 mg/L — ABNORMAL HIGH (ref 3.3–19.4)
Kappa, lambda light chain ratio: 1.73 — ABNORMAL HIGH (ref 0.26–1.65)
Lambda free light chains: 27.4 mg/L — ABNORMAL HIGH (ref 5.7–26.3)

## 2022-12-15 ENCOUNTER — Other Ambulatory Visit: Payer: Self-pay | Admitting: Pharmacist

## 2022-12-15 LAB — HAPTOGLOBIN: Haptoglobin: 234 mg/dL (ref 34–355)

## 2022-12-15 MED FILL — Ferumoxytol Inj 510 MG/17ML (30 MG/ML) (Elemental Fe): INTRAVENOUS | Qty: 17 | Status: AC

## 2022-12-16 ENCOUNTER — Inpatient Hospital Stay: Payer: Medicare Other | Attending: Oncology

## 2022-12-16 VITALS — BP 166/74 | HR 74 | Temp 98.0°F | Resp 18 | Wt 180.1 lb

## 2022-12-16 DIAGNOSIS — D539 Nutritional anemia, unspecified: Secondary | ICD-10-CM

## 2022-12-16 DIAGNOSIS — D509 Iron deficiency anemia, unspecified: Secondary | ICD-10-CM | POA: Diagnosis not present

## 2022-12-16 MED ORDER — LORATADINE 10 MG PO TABS
10.0000 mg | ORAL_TABLET | Freq: Once | ORAL | Status: AC
  Administered 2022-12-16: 10 mg via ORAL
  Filled 2022-12-16: qty 1

## 2022-12-16 MED ORDER — ACETAMINOPHEN 325 MG PO TABS
650.0000 mg | ORAL_TABLET | Freq: Once | ORAL | Status: AC
  Administered 2022-12-16: 650 mg via ORAL
  Filled 2022-12-16: qty 2

## 2022-12-16 MED ORDER — SODIUM CHLORIDE 0.9 % IV SOLN: Freq: Once | INTRAVENOUS | Status: AC

## 2022-12-16 MED ORDER — SODIUM CHLORIDE 0.9 % IV SOLN
510.0000 mg | Freq: Once | INTRAVENOUS | Status: AC
  Administered 2022-12-16: 510 mg via INTRAVENOUS
  Filled 2022-12-16: qty 510

## 2022-12-16 NOTE — Patient Instructions (Signed)

## 2022-12-18 LAB — ZINC: Zinc: 55 ug/dL (ref 44–115)

## 2022-12-18 LAB — COPPER, SERUM: Copper: 135 ug/dL — ABNORMAL HIGH (ref 69–132)

## 2022-12-19 ENCOUNTER — Encounter: Payer: Self-pay | Admitting: Oncology

## 2022-12-19 DIAGNOSIS — Z7982 Long term (current) use of aspirin: Secondary | ICD-10-CM | POA: Insufficient documentation

## 2022-12-19 DIAGNOSIS — K635 Polyp of colon: Secondary | ICD-10-CM | POA: Insufficient documentation

## 2022-12-19 DIAGNOSIS — Z789 Other specified health status: Secondary | ICD-10-CM | POA: Insufficient documentation

## 2022-12-19 MED FILL — Ferumoxytol Inj 510 MG/17ML (30 MG/ML) (Elemental Fe): INTRAVENOUS | Qty: 17 | Status: AC

## 2022-12-20 ENCOUNTER — Inpatient Hospital Stay: Payer: Medicare Other

## 2022-12-20 VITALS — BP 160/74 | HR 89 | Temp 98.1°F | Resp 18 | Ht 67.0 in | Wt 180.0 lb

## 2022-12-20 DIAGNOSIS — D539 Nutritional anemia, unspecified: Secondary | ICD-10-CM

## 2022-12-20 DIAGNOSIS — D509 Iron deficiency anemia, unspecified: Secondary | ICD-10-CM | POA: Diagnosis not present

## 2022-12-20 LAB — MULTIPLE MYELOMA PANEL, SERUM
Albumin SerPl Elph-Mcnc: 3.4 g/dL (ref 2.9–4.4)
Albumin/Glob SerPl: 0.9 (ref 0.7–1.7)
Alpha 1: 0.3 g/dL (ref 0.0–0.4)
Alpha2 Glob SerPl Elph-Mcnc: 0.9 g/dL (ref 0.4–1.0)
B-Globulin SerPl Elph-Mcnc: 1.1 g/dL (ref 0.7–1.3)
Gamma Glob SerPl Elph-Mcnc: 1.5 g/dL (ref 0.4–1.8)
Globulin, Total: 3.8 g/dL (ref 2.2–3.9)
IgA: 270 mg/dL (ref 61–437)
IgG (Immunoglobin G), Serum: 1576 mg/dL (ref 603–1613)
IgM (Immunoglobulin M), Srm: 82 mg/dL (ref 15–143)
Total Protein ELP: 7.2 g/dL (ref 6.0–8.5)

## 2022-12-20 MED ORDER — SODIUM CHLORIDE 0.9 % IV SOLN: Freq: Once | INTRAVENOUS | Status: AC

## 2022-12-20 MED ORDER — ACETAMINOPHEN 325 MG PO TABS: 650.0000 mg | ORAL_TABLET | Freq: Once | ORAL | Status: DC

## 2022-12-20 MED ORDER — LORATADINE 10 MG PO TABS: 10.0000 mg | ORAL_TABLET | Freq: Once | ORAL | Status: DC

## 2022-12-20 MED ORDER — SODIUM CHLORIDE 0.9 % IV SOLN
510.0000 mg | Freq: Once | INTRAVENOUS | Status: AC
  Administered 2022-12-20: 510 mg via INTRAVENOUS
  Filled 2022-12-20: qty 510

## 2022-12-20 NOTE — Patient Instructions (Signed)

## 2022-12-27 ENCOUNTER — Telehealth: Payer: Self-pay | Admitting: Oncology

## 2022-12-27 ENCOUNTER — Inpatient Hospital Stay: Payer: Medicare Other

## 2022-12-27 ENCOUNTER — Inpatient Hospital Stay (INDEPENDENT_AMBULATORY_CARE_PROVIDER_SITE_OTHER): Payer: Medicare Other | Admitting: Oncology

## 2022-12-27 VITALS — BP 192/82 | HR 69 | Temp 98.1°F | Resp 14 | Ht 67.0 in | Wt 177.8 lb

## 2022-12-27 DIAGNOSIS — D509 Iron deficiency anemia, unspecified: Secondary | ICD-10-CM | POA: Diagnosis not present

## 2022-12-27 DIAGNOSIS — R972 Elevated prostate specific antigen [PSA]: Secondary | ICD-10-CM

## 2022-12-27 DIAGNOSIS — Z789 Other specified health status: Secondary | ICD-10-CM

## 2022-12-27 DIAGNOSIS — D539 Nutritional anemia, unspecified: Secondary | ICD-10-CM | POA: Diagnosis not present

## 2022-12-27 DIAGNOSIS — Z7982 Long term (current) use of aspirin: Secondary | ICD-10-CM

## 2022-12-27 LAB — FERRITIN: Ferritin: 570 ng/mL — ABNORMAL HIGH (ref 24–336)

## 2022-12-27 LAB — CBC WITH DIFFERENTIAL/PLATELET
Abs Immature Granulocytes: 0.03 10*3/uL (ref 0.00–0.07)
Basophils Absolute: 0.1 10*3/uL (ref 0.0–0.1)
Basophils Relative: 1 %
Eosinophils Absolute: 0.1 10*3/uL (ref 0.0–0.5)
Eosinophils Relative: 2 %
HCT: 35.8 % — ABNORMAL LOW (ref 39.0–52.0)
Hemoglobin: 11 g/dL — ABNORMAL LOW (ref 13.0–17.0)
Immature Granulocytes: 0 %
Lymphocytes Relative: 11 %
Lymphs Abs: 0.9 10*3/uL (ref 0.7–4.0)
MCH: 24.7 pg — ABNORMAL LOW (ref 26.0–34.0)
MCHC: 30.7 g/dL (ref 30.0–36.0)
MCV: 80.3 fL (ref 80.0–100.0)
Monocytes Absolute: 0.7 10*3/uL (ref 0.1–1.0)
Monocytes Relative: 9 %
Neutro Abs: 6 10*3/uL (ref 1.7–7.7)
Neutrophils Relative %: 77 %
Platelets: 347 10*3/uL (ref 150–400)
RBC: 4.46 MIL/uL (ref 4.22–5.81)
RDW: 19.8 % — ABNORMAL HIGH (ref 11.5–15.5)
WBC: 7.7 10*3/uL (ref 4.0–10.5)
nRBC: 0 % (ref 0.0–0.2)

## 2022-12-27 NOTE — Telephone Encounter (Signed)
12/27/22 Next appt scheduled and confirmed with patient

## 2022-12-27 NOTE — Progress Notes (Signed)
[facility] Follow up Visit:  Patient Care Team: Nicoletta Dress, MD as PCP - General (Internal Medicine)  CHIEF COMPLAINTS/PURPOSE OF CONSULTATION:  HISTORY OF PRESENTING ILLNESS: James Norton. 82 y.o. male is here because of anemia Medical history notable for diabetes mellitus type 2, Charcot joint, hyperlipidemia, hypertension, diabetic foot ulcer allergic rhinitis  February 21, 2022 WBC 6.0 hemoglobin 12.9 MCV 82 platelet count 273 normal differential  August 29, 2022 WBC 8.3 hemoglobin 12.2 MCV 80 platelet count 368 normal differential  December 01, 2022 WBC 10.0 hemoglobin 11.6 MCV 76 platelet count 4 9; 79 segs 11 lymphs 8 monos 1 EO 1 basophil Iron saturation 8% B12 433 Hemoglobin A1c 8.7 CMP notable for creatinine 0.82  December 13, 2022: [name]  Has been taking oral iron for a couple of years.  Has never received IV iron nor required PRBC's in the past.   Has  tolerated oral iron.  Has a normal diet.  No history of hemorrhage postoperatively requiring transfusion.   No hematochezia, melena, hemoptysis, hematuria.   No /has history of intra-articular or soft tissue bleeding.  Is not taking oral anticoagulants.  Takes ASA daily.  Takes Ibuprofen PRN for neuropathy.  No history of abnormal bleeding in family members.  Patient does not have fatigue, pallor,  DOE, decreased performance status, pica to ice/starch/dirt.    Last colonoscopy was about 5 yrs ago which was negative for polyps.  On previous colonoscopies he had polyps which were removed.  He has not undergone an EGD.  Social:  Married.  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  Tobacco none.  EtOH none.   WBC 8.9 hemoglobin 12.0 MCV 79 platelet count 394; 82 segs 10 lymphs 6 monos EO 1 basophil burr cells noted.  Reticulocyte count 1.7% [place] test negative haptoglobin 234 SPEP with IEP negative for paraprotein serum free kappa 47.5 lambda 27.4 with a kappa lambda 1.73 IgG 1576  IgA 270 IgM 82 B12 greater than 7500 ferritin 20 folate 16.4 Copper 135 zinc 55 CMP notable for glucose 147 protein 8.3  December 16, 2022 Feraheme 510 mg  December 20, 2022: Feraheme 510 mg  December 27 2022:  Scheduled follow up for management of anemia.  Reviewed results of labs since last visit.  Gets B12 injections monthly by PCP.  Feels a bit better since receiving IV iron.  Will refer to GI for consideration of endoscopy to evaluate IDA.  To see urology regarding elevated PSA   Review of Systems - Oncology  MEDICAL HISTORY: Past Medical History:  Diagnosis Date   B12 deficiency anemia    Colon polyps    Diabetes ([place])    Diabetic foot ulcer ([place])    Diverticulosis    Hyperlipidemia    Hypertension    Iron deficiency anemia     SURGICAL HISTORY: Past Surgical History:  Procedure Laterality Date   COLONOSCOPY     left metatarsal resection  09/10/2020   left sesamoidectomy  07/09/2020   TONSILLECTOMY      SOCIAL HISTORY: Social History   Socioeconomic History   Marital status: Married    Spouse name: Not on file   Number of children: Not on file   Years of education: Not on file   Highest education level: Not on file  Occupational History   Not on file  Tobacco Use   Smoking status: Never   Smokeless tobacco: Never  Vaping Use   Vaping Use: Never used  Substance  and Sexual Activity   Alcohol use: Not Currently   Drug use: Never   Sexual activity: Not on file  Other Topics Concern   Not on file  Social History Narrative   Not on file   Social Determinants of Health   Financial Resource Strain: Not on file  Food Insecurity: No Food Insecurity (12/13/2022)   Hunger Vital Sign    Worried About Running Out of Food in the Last Year: Never true    Ran Out of Food in the Last Year: Never true  Transportation Needs: No Transportation Needs (12/13/2022)   PRAPARE - Hydrologist (Medical): No    Lack of Transportation  (Non-Medical): No  Physical Activity: Not on file  Stress: Not on file  Social Connections: Not on file  Intimate Partner Violence: Not At Risk (12/13/2022)   Humiliation, Afraid, Rape, and Kick questionnaire    Fear of Current or Ex-Partner: No    Emotionally Abused: No    Physically Abused: No    Sexually Abused: No    FAMILY HISTORY Family History  Problem Relation Age of Onset   Bone cancer Mother    Hypertension Father    Stroke Father    Diabetes Brother    Breast cancer Paternal Grandmother    Diabetes Daughter     ALLERGIES:  has No Known Allergies.  MEDICATIONS:  Current Outpatient Medications  Medication Sig Dispense Refill   amLODipine (NORVASC) 5 MG tablet      atorvastatin (LIPITOR) 80 MG tablet      Cyanocobalamin (B-12 COMPLIANCE INJECTION IJ) Inject as directed.     ferrous sulfate 325 (65 FE) MG EC tablet Take 325 mg by mouth in the morning and at bedtime.     finasteride (PROSCAR) 5 MG tablet Take 5 mg by mouth daily.     ibuprofen (ADVIL) 800 MG tablet Take 800 mg by mouth 3 (three) times daily.     insulin degludec (TRESIBA FLEXTOUCH) 100 UNIT/ML FlexTouch Pen Inject 45 Units into the skin at bedtime.     lisinopril (ZESTRIL) 20 MG tablet      metFORMIN (GLUCOPHAGE-XR) 500 MG 24 hr tablet Take 500 mg by mouth 2 (two) times daily with a meal.     tamsulosin (FLOMAX) 0.4 MG CAPS capsule Take 0.4 mg by mouth daily.     No current facility-administered medications for this visit.    PHYSICAL EXAMINATION:  ECOG PERFORMANCE STATUS: 1 - Symptomatic but completely ambulatory   Vitals:   12/27/22 1027  BP: (!) 192/82  Pulse: 69  Resp: 14  Temp: 98.1 F (36.7 C)  SpO2: 97%    Filed Weights   12/27/22 1027  Weight: 177 lb 12.8 oz (80.6 kg)     Physical Exam Vitals and nursing note reviewed.  Constitutional:      General: He is not in acute distress.    Appearance: Normal appearance. He is normal weight. He is not ill-appearing or diaphoretic.   HENT:     Head: Normocephalic and atraumatic.     Right Ear: External ear normal.     Left Ear: External ear normal.     Nose: Nose normal.  Eyes:     General: No scleral icterus.    Conjunctiva/sclera: Conjunctivae normal.     Pupils: Pupils are equal, round, and reactive to light.  Cardiovascular:     Rate and Rhythm: Normal rate and regular rhythm.     Heart sounds:  No friction rub. No gallop.  Pulmonary:     Effort: Pulmonary effort is normal. No respiratory distress.     Breath sounds: Normal breath sounds. No stridor. No wheezing.  Abdominal:     Palpations: Abdomen is soft.     Tenderness: There is no abdominal tenderness. There is no guarding or rebound.  Musculoskeletal:        General: No deformity or signs of injury.     Cervical back: Normal range of motion and neck supple. No rigidity or tenderness.     Right lower leg: No edema.     Left lower leg: No edema.  Lymphadenopathy:     Head:     Right side of head: No submental, submandibular, tonsillar, preauricular, posterior auricular or occipital adenopathy.     Left side of head: No submental, submandibular, tonsillar, preauricular, posterior auricular or occipital adenopathy.     Cervical: No cervical adenopathy.     Right cervical: No superficial, deep or posterior cervical adenopathy.    Left cervical: No superficial, deep or posterior cervical adenopathy.     Upper Body:     Right upper body: No supraclavicular or axillary adenopathy.     Left upper body: No supraclavicular or axillary adenopathy.     Lower Body: No right inguinal adenopathy. No left inguinal adenopathy.  Skin:    General: Skin is warm.     Coloration: Skin is not jaundiced.     Findings: No bruising.  Neurological:     General: No focal deficit present.     Mental Status: He is alert and oriented to person, place, and time.     Cranial Nerves: No cranial nerve deficit.  Psychiatric:        Mood and Affect: Mood normal.         Behavior: Behavior normal.        Thought Content: Thought content normal.        Judgment: Judgment normal.      LABORATORY DATA: I have personally reviewed the data as listed:  Appointment on 12/13/2022  Component Date Value Ref Range Status   WBC MORPHOLOGY 12/13/2022 MORPHOLOGY UNREMARKABLE   Final   RBC MORPHOLOGY 12/13/2022 BURR CELLS   Final   Comment: Acanthocytes present OVALOCYTES POLYCHROMASIA PRESENT    Plt Morphology 12/13/2022 PLATELETS APPEAR ADEQUATE   Final   MORPHOLOGY UNREMARKABLE   Clinical Information 12/13/2022 anemia   Final   Performed at [facility], [place] [address] [place], [place] [address]   Sodium 12/13/2022 137  135 - 145 mmol/L Final   Potassium 12/13/2022 3.9  3.5 - 5.1 mmol/L Final   Chloride 12/13/2022 102  98 - 111 mmol/L Final   CO2 12/13/2022 24  22 - 32 mmol/L Final   Glucose, Bld 12/13/2022 147 (H)  70 - 99 mg/dL Final   Glucose reference range applies only to samples taken after fasting for at least 8 hours.   BUN 12/13/2022 17  8 - 23 mg/dL Final   Creatinine 12/13/2022 0.95  0.61 - 1.24 mg/dL Final   Calcium 12/13/2022 9.4  8.9 - 10.3 mg/dL Final   Total Protein 12/13/2022 8.3 (H)  6.5 - 8.1 g/dL Final   Albumin 12/13/2022 4.0  3.5 - 5.0 g/dL Final   AST 12/13/2022 21  15 - 41 U/L Final   ALT 12/13/2022 15  0 - 44 U/L Final   Alkaline Phosphatase 12/13/2022 112  38 - 126 U/L Final   Total  Bilirubin 12/13/2022 0.4  0.3 - 1.2 mg/dL Final   GFR, Estimated 12/13/2022 >60  >60 mL/min Final   Comment: (NOTE) Calculated using the CKD-EPI Creatinine Equation (2021)    Anion gap 12/13/2022 11  5 - 15 Final   Performed at [facility], [place] [address] [place], [place] [address]   Vitamin B-12 12/13/2022 >7,500 (H)  180 - 914 pg/mL Final   Comment: RESULT CONFIRMED BY MANUAL DILUTION (NOTE) This assay is not validated for testing neonatal or myeloproliferative syndrome specimens for Vitamin B12  levels. Performed at [facility], [place] [address], [place],  [address]    Retic Ct Pct 12/13/2022 1.7  0.4 - 3.1 % Final   RBC. 12/13/2022 5.04  4.22 - 5.81 MIL/uL Final   Retic Count, Absolute 12/13/2022 85.2  19.0 - 186.0 K/uL Final   Immature Retic Fract 12/13/2022 14.1  2.3 - 15.9 % Final   Performed at [facility], [place] [address], [place], [place] [address]   IgG (Immunoglobin G), Serum 12/13/2022 1,576  603 - 1,613 mg/dL Final   IgA 12/13/2022 270  61 - 437 mg/dL Final   IgM (Immunoglobulin M), Srm 12/13/2022 82  15 - 143 mg/dL Final   Total Protein ELP 12/13/2022 7.2  6.0 - 8.5 g/dL Corrected   Albumin SerPl Elph-Mcnc 12/13/2022 3.4  2.9 - 4.4 g/dL Corrected   Alpha 1 12/13/2022 0.3  0.0 - 0.4 g/dL Corrected   Alpha2 Glob SerPl Elph-Mcnc 12/13/2022 0.9  0.4 - 1.0 g/dL Corrected   B-Globulin SerPl Elph-Mcnc 12/13/2022 1.1  0.7 - 1.3 g/dL Corrected   Gamma Glob SerPl Elph-Mcnc 12/13/2022 1.5  0.4 - 1.8 g/dL Corrected   M Protein SerPl Elph-Mcnc 12/13/2022 Not Observed  Not Observed g/dL Corrected   Globulin, Total 12/13/2022 3.8  2.2 - 3.9 g/dL Corrected   Albumin/Glob SerPl 12/13/2022 0.9  0.7 - 1.7 Corrected   IFE 1 12/13/2022 Comment   Corrected   Comment: (NOTE) The immunofixation pattern appears unremarkable. Evidence of monoclonal protein is not apparent.    Please Note 12/13/2022 Comment   Corrected   Comment: (NOTE) Protein electrophoresis scan will follow via computer, mail, or courier delivery. Performed At: [facility] [place], [place] [id number] Rush Farmer MD [id number]    Kappa free light chain 12/13/2022 47.5 (H)  3.3 - 19.4 mg/L Final   Lambda free light chains 12/13/2022 27.4 (H)  5.7 - 26.3 mg/L Final   Kappa, lambda light chain ratio 12/13/2022 1.73 (H)  0.26 - 1.65 Final   Comment: (NOTE) Performed At: [facility] [address] [place], [place] [id number] Rush Farmer MD [id number]    DAT, complement 12/13/2022 NEG   Final   DAT, IgG 12/13/2022    Final                   Value:NEG Performed at [facility], [place] [address], [place],  [address]    Copper 12/13/2022 135 (H)  69 - 132 ug/dL Final   Comment: (NOTE) This test was developed and its performance characteristics determined by [facility]. It has not been cleared or approved by the Food and Drug Administration.                                Detection Limit = 5 Performed At: [facility] [address] [place], [place] [id number] Perlie Gold  Derinda Late MD [id number]    Zinc 12/13/2022 55  44 - 115 ug/dL Final   Comment: (NOTE) This test was developed and its performance characteristics determined by [facility]. It has not been cleared or approved by the Food and Drug Administration.                                Detection Limit = 5 Performed At: [facility] [address] [place], [place] [id number] Rush Farmer MD [id number]    Haptoglobin 12/13/2022 234  34 - 355 mg/dL Final   Comment: (NOTE) Performed At: [facility] [place], [place] [id number] Rush Farmer MD [id number]    Folate 12/13/2022 16.4  >5.9 ng/mL Final   Performed at [facility], [place] [address] [place], [place] [address]   Ferritin 12/13/2022 20 (L)  24 - 336 ng/mL Final   Performed at [facility], [place] [address], [place], [place] [address]  Office Visit on 12/13/2022  Component Date Value Ref Range Status   WBC 12/13/2022 8.9  4.0 - 10.5 K/uL Final   RBC 12/13/2022 5.05  4.22 - 5.81 MIL/uL Final   Hemoglobin 12/13/2022 12.0 (L)  13.0 - 17.0 g/dL Final   HCT 12/13/2022 39.7  39.0 - 52.0 % Final   MCV 12/13/2022 78.6 (L)  80.0 - 100.0 fL Final   MCH 12/13/2022 23.8 (L)  26.0 - 34.0 pg Final   MCHC 12/13/2022 30.2  30.0 - 36.0 g/dL Final   RDW 12/13/2022 16.8 (H)  11.5 - 15.5 % Final   Platelets  12/13/2022 394  150 - 400 K/uL Final   nRBC 12/13/2022 0.0  0.0 - 0.2 % Final   Neutrophils Relative % 12/13/2022 82  % Final   Neutro Abs 12/13/2022 7.3  1.7 - 7.7 K/uL Final   Lymphocytes Relative 12/13/2022 10  % Final   Lymphs Abs 12/13/2022 0.9  0.7 - 4.0 K/uL Final   Monocytes Relative 12/13/2022 6  % Final   Monocytes Absolute 12/13/2022 0.6  0.1 - 1.0 K/uL Final   Eosinophils Relative 12/13/2022 1  % Final   Eosinophils Absolute 12/13/2022 0.1  0.0 - 0.5 K/uL Final   Basophils Relative 12/13/2022 1  % Final   Basophils Absolute 12/13/2022 0.1  0.0 - 0.1 K/uL Final   Immature Granulocytes 12/13/2022 0  % Final   Abs Immature Granulocytes 12/13/2022 0.02  0.00 - 0.07 K/uL Final   Performed at [facility], [place] [address], [place], [place] [address]    RADIOGRAPHIC STUDIES: I have personally reviewed the radiological images as listed and agree with the findings in the report  No results found.  ASSESSMENT/PLAN   82 y.o. male with edical history notable for diabetes mellitus type 2, Charcot joint, hyperlipidemia, hypertension, diabetic foot ulcer allergic rhinitis.  Patient seen for evaluation and management of anemia  Anemia:   December 13 2022:  Secondary to iron deficiency from GI blood loss despite oral iron.  Patient has history of colon polyps.  He is also on ASA daily.   December 16, 2022 Feraheme 510 mg   December 20, 2022: Feraheme 510 mg  December 27 2022:  Referring to GI for consideration of endoscopy.  He has history of colon polyps and has never undergone an EGD   Elevated PSA:  To see Urology   Cancer Staging  No matching staging information was found for  the patient.   No problem-specific Assessment & Plan notes found for this encounter.   Orders Placed This Encounter  Procedures   CBC with Differential/Platelet   Ferritin    Standing Status:   Future    Standing Expiration Date:   12/28/2023   Ambulatory referral to Gastroenterology     Referral Priority:   Routine    Referral Type:   Consultation    Referral Reason:   Specialty Services Required    Number of Visits Requested:   1    31  minutes was spent in patient care.  This included time spent preparing to see the patient (e.g., review of tests), obtaining and/or reviewing separately obtained history, counseling and educating the patient/family/caregiver, ordering medications, tests, or procedures; documenting clinical information in the electronic or other health record, independently interpreting results and communicating results to the patient/family/caregiver as well as coordination of care.      All questions were answered. The patient knows to call the clinic with any problems, questions or concerns.  This note was electronically signed.    Barbee Cough, MD  12/27/2022 11:00 AM

## 2022-12-30 DIAGNOSIS — H401131 Primary open-angle glaucoma, bilateral, mild stage: Secondary | ICD-10-CM | POA: Diagnosis not present

## 2022-12-30 DIAGNOSIS — H2513 Age-related nuclear cataract, bilateral: Secondary | ICD-10-CM | POA: Diagnosis not present

## 2023-01-02 ENCOUNTER — Encounter: Payer: Self-pay | Admitting: Oncology

## 2023-01-02 DIAGNOSIS — R972 Elevated prostate specific antigen [PSA]: Secondary | ICD-10-CM | POA: Insufficient documentation

## 2023-01-03 ENCOUNTER — Telehealth: Payer: Self-pay

## 2023-01-03 NOTE — Telephone Encounter (Signed)
Referral to [facility] referral faxed

## 2023-01-12 DIAGNOSIS — D519 Vitamin B12 deficiency anemia, unspecified: Secondary | ICD-10-CM | POA: Diagnosis not present

## 2023-01-18 DIAGNOSIS — E1142 Type 2 diabetes mellitus with diabetic polyneuropathy: Secondary | ICD-10-CM | POA: Diagnosis not present

## 2023-01-26 DIAGNOSIS — N401 Enlarged prostate with lower urinary tract symptoms: Secondary | ICD-10-CM | POA: Diagnosis not present

## 2023-01-26 DIAGNOSIS — R972 Elevated prostate specific antigen [PSA]: Secondary | ICD-10-CM | POA: Diagnosis not present

## 2023-02-14 DIAGNOSIS — D519 Vitamin B12 deficiency anemia, unspecified: Secondary | ICD-10-CM | POA: Diagnosis not present

## 2023-02-20 ENCOUNTER — Encounter: Payer: Self-pay | Admitting: Oncology

## 2023-02-21 DIAGNOSIS — N401 Enlarged prostate with lower urinary tract symptoms: Secondary | ICD-10-CM | POA: Diagnosis not present

## 2023-02-21 DIAGNOSIS — R972 Elevated prostate specific antigen [PSA]: Secondary | ICD-10-CM | POA: Diagnosis not present

## 2023-02-27 DIAGNOSIS — K635 Polyp of colon: Secondary | ICD-10-CM | POA: Diagnosis not present

## 2023-02-27 DIAGNOSIS — E1142 Type 2 diabetes mellitus with diabetic polyneuropathy: Secondary | ICD-10-CM | POA: Diagnosis not present

## 2023-02-27 DIAGNOSIS — D519 Vitamin B12 deficiency anemia, unspecified: Secondary | ICD-10-CM | POA: Diagnosis not present

## 2023-02-27 DIAGNOSIS — D509 Iron deficiency anemia, unspecified: Secondary | ICD-10-CM | POA: Diagnosis not present

## 2023-03-01 ENCOUNTER — Ambulatory Visit: Payer: Medicare Other | Admitting: Podiatry

## 2023-03-01 DIAGNOSIS — N401 Enlarged prostate with lower urinary tract symptoms: Secondary | ICD-10-CM | POA: Diagnosis not present

## 2023-03-01 DIAGNOSIS — R972 Elevated prostate specific antigen [PSA]: Secondary | ICD-10-CM | POA: Diagnosis not present

## 2023-03-01 DIAGNOSIS — H401131 Primary open-angle glaucoma, bilateral, mild stage: Secondary | ICD-10-CM | POA: Diagnosis not present

## 2023-03-01 DIAGNOSIS — H2513 Age-related nuclear cataract, bilateral: Secondary | ICD-10-CM | POA: Diagnosis not present

## 2023-03-06 DIAGNOSIS — D509 Iron deficiency anemia, unspecified: Secondary | ICD-10-CM | POA: Diagnosis not present

## 2023-03-06 DIAGNOSIS — E1142 Type 2 diabetes mellitus with diabetic polyneuropathy: Secondary | ICD-10-CM | POA: Diagnosis not present

## 2023-03-06 DIAGNOSIS — E785 Hyperlipidemia, unspecified: Secondary | ICD-10-CM | POA: Diagnosis not present

## 2023-03-06 DIAGNOSIS — R972 Elevated prostate specific antigen [PSA]: Secondary | ICD-10-CM | POA: Diagnosis not present

## 2023-03-06 DIAGNOSIS — D519 Vitamin B12 deficiency anemia, unspecified: Secondary | ICD-10-CM | POA: Diagnosis not present

## 2023-03-08 DIAGNOSIS — K529 Noninfective gastroenteritis and colitis, unspecified: Secondary | ICD-10-CM | POA: Diagnosis not present

## 2023-03-08 DIAGNOSIS — K2970 Gastritis, unspecified, without bleeding: Secondary | ICD-10-CM | POA: Diagnosis not present

## 2023-03-08 DIAGNOSIS — K5730 Diverticulosis of large intestine without perforation or abscess without bleeding: Secondary | ICD-10-CM | POA: Diagnosis not present

## 2023-03-08 DIAGNOSIS — K254 Chronic or unspecified gastric ulcer with hemorrhage: Secondary | ICD-10-CM | POA: Diagnosis not present

## 2023-03-08 DIAGNOSIS — D124 Benign neoplasm of descending colon: Secondary | ICD-10-CM | POA: Diagnosis not present

## 2023-03-08 DIAGNOSIS — I10 Essential (primary) hypertension: Secondary | ICD-10-CM | POA: Diagnosis not present

## 2023-03-08 DIAGNOSIS — D509 Iron deficiency anemia, unspecified: Secondary | ICD-10-CM | POA: Diagnosis not present

## 2023-03-08 DIAGNOSIS — K222 Esophageal obstruction: Secondary | ICD-10-CM | POA: Diagnosis not present

## 2023-03-08 DIAGNOSIS — K259 Gastric ulcer, unspecified as acute or chronic, without hemorrhage or perforation: Secondary | ICD-10-CM | POA: Diagnosis not present

## 2023-03-08 DIAGNOSIS — D649 Anemia, unspecified: Secondary | ICD-10-CM | POA: Diagnosis not present

## 2023-03-08 DIAGNOSIS — Z86010 Personal history of colonic polyps: Secondary | ICD-10-CM | POA: Diagnosis not present

## 2023-03-08 DIAGNOSIS — D126 Benign neoplasm of colon, unspecified: Secondary | ICD-10-CM | POA: Diagnosis not present

## 2023-03-08 DIAGNOSIS — K635 Polyp of colon: Secondary | ICD-10-CM | POA: Diagnosis not present

## 2023-03-08 DIAGNOSIS — E119 Type 2 diabetes mellitus without complications: Secondary | ICD-10-CM | POA: Diagnosis not present

## 2023-03-09 DIAGNOSIS — E785 Hyperlipidemia, unspecified: Secondary | ICD-10-CM | POA: Diagnosis not present

## 2023-03-09 DIAGNOSIS — E1142 Type 2 diabetes mellitus with diabetic polyneuropathy: Secondary | ICD-10-CM | POA: Diagnosis not present

## 2023-03-09 DIAGNOSIS — E875 Hyperkalemia: Secondary | ICD-10-CM | POA: Diagnosis not present

## 2023-03-09 DIAGNOSIS — I10 Essential (primary) hypertension: Secondary | ICD-10-CM | POA: Diagnosis not present

## 2023-03-16 DIAGNOSIS — D519 Vitamin B12 deficiency anemia, unspecified: Secondary | ICD-10-CM | POA: Diagnosis not present

## 2023-03-27 ENCOUNTER — Encounter: Payer: Self-pay | Admitting: Oncology

## 2023-03-27 ENCOUNTER — Inpatient Hospital Stay: Payer: Medicare Other | Attending: Oncology | Admitting: Oncology

## 2023-03-27 ENCOUNTER — Inpatient Hospital Stay: Payer: Medicare Other

## 2023-03-27 VITALS — BP 162/55 | HR 78 | Temp 98.0°F | Resp 18 | Ht 67.0 in | Wt 168.0 lb

## 2023-03-27 DIAGNOSIS — E785 Hyperlipidemia, unspecified: Secondary | ICD-10-CM | POA: Insufficient documentation

## 2023-03-27 DIAGNOSIS — Z7982 Long term (current) use of aspirin: Secondary | ICD-10-CM | POA: Diagnosis not present

## 2023-03-27 DIAGNOSIS — D500 Iron deficiency anemia secondary to blood loss (chronic): Secondary | ICD-10-CM | POA: Diagnosis not present

## 2023-03-27 DIAGNOSIS — D539 Nutritional anemia, unspecified: Secondary | ICD-10-CM | POA: Diagnosis not present

## 2023-03-27 DIAGNOSIS — K5731 Diverticulosis of large intestine without perforation or abscess with bleeding: Secondary | ICD-10-CM | POA: Diagnosis not present

## 2023-03-27 DIAGNOSIS — Z789 Other specified health status: Secondary | ICD-10-CM

## 2023-03-27 DIAGNOSIS — D126 Benign neoplasm of colon, unspecified: Secondary | ICD-10-CM

## 2023-03-27 DIAGNOSIS — I10 Essential (primary) hypertension: Secondary | ICD-10-CM | POA: Diagnosis not present

## 2023-03-27 DIAGNOSIS — R972 Elevated prostate specific antigen [PSA]: Secondary | ICD-10-CM | POA: Diagnosis not present

## 2023-03-27 DIAGNOSIS — E11621 Type 2 diabetes mellitus with foot ulcer: Secondary | ICD-10-CM | POA: Insufficient documentation

## 2023-03-27 LAB — CBC WITH DIFFERENTIAL/PLATELET
Abs Immature Granulocytes: 0.01 10*3/uL (ref 0.00–0.07)
Basophils Absolute: 0.1 10*3/uL (ref 0.0–0.1)
Basophils Relative: 1 %
Eosinophils Absolute: 0 10*3/uL (ref 0.0–0.5)
Eosinophils Relative: 0 %
HCT: 35.6 % — ABNORMAL LOW (ref 39.0–52.0)
Hemoglobin: 11.3 g/dL — ABNORMAL LOW (ref 13.0–17.0)
Immature Granulocytes: 0 %
Lymphocytes Relative: 8 %
Lymphs Abs: 0.7 10*3/uL (ref 0.7–4.0)
MCH: 26 pg (ref 26.0–34.0)
MCHC: 31.7 g/dL (ref 30.0–36.0)
MCV: 82 fL (ref 80.0–100.0)
Monocytes Absolute: 0.6 10*3/uL (ref 0.1–1.0)
Monocytes Relative: 7 %
Neutro Abs: 7.5 10*3/uL (ref 1.7–7.7)
Neutrophils Relative %: 84 %
Platelets: 335 10*3/uL (ref 150–400)
RBC: 4.34 MIL/uL (ref 4.22–5.81)
RDW: 17.8 % — ABNORMAL HIGH (ref 11.5–15.5)
WBC: 8.9 10*3/uL (ref 4.0–10.5)
nRBC: 0 % (ref 0.0–0.2)

## 2023-03-27 LAB — FERRITIN: Ferritin: 187 ng/mL (ref 24–336)

## 2023-03-27 LAB — VITAMIN B12: Vitamin B-12: 428 pg/mL (ref 180–914)

## 2023-03-27 NOTE — Progress Notes (Signed)
[facility] Cancer Follow up Visit:  Patient Care Team: Paulina Fusi, MD as PCP - General (Internal Medicine)  CHIEF COMPLAINTS/PURPOSE OF CONSULTATION:  HISTORY OF PRESENTING ILLNESS: James Norton. 82 y.o. male is here because of anemia Medical history notable for diabetes mellitus type 2, Charcot joint, hyperlipidemia, hypertension, diabetic foot ulcer allergic rhinitis  February 21, 2022 WBC 6.0 hemoglobin 12.9 MCV 82 platelet count 273 normal differential  August 29, 2022 WBC 8.3 hemoglobin 12.2 MCV 80 platelet count 368 normal differential  December 01, 2022 WBC 10.0 hemoglobin 11.6 MCV 76 platelet count 4 9; 79 segs 11 lymphs 8 monos 1 EO 1 basophil Iron saturation 8% B12 433 Hemoglobin A1c 8.7 CMP notable for creatinine 0.82  December 13, 2022: [name] Hematology Consult  Has been taking oral iron for a couple of years.  Has never received IV iron nor required PRBC's in the past.   Has  tolerated oral iron.  Has a normal diet.  No history of hemorrhage postoperatively requiring transfusion.   No hematochezia, melena, hemoptysis, hematuria.   No /has history of intra-articular or soft tissue bleeding.  Is not taking oral anticoagulants.  Takes ASA daily.  Takes Ibuprofen PRN for neuropathy.  No history of abnormal bleeding in family members.  Patient does not have fatigue, pallor,  DOE, decreased performance status, pica to ice/starch/dirt.    Last colonoscopy was about 5 yrs ago which was negative for polyps.  On previous colonoscopies he had polyps which were removed.  He has not undergone an EGD.  Social:  Married.  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  Tobacco none.  EtOH none.   WBC 8.9 hemoglobin 12.0 MCV 79 platelet count 394; 82 segs 10 lymphs 6 monos EO 1 basophil burr cells noted.  Reticulocyte count 1.7% Coombs test negative haptoglobin 234 SPEP with IEP negative for paraprotein serum free kappa 47.5 lambda 27.4 with a kappa lambda 1.73 IgG 1576  IgA 270 IgM 82 B12 greater than 7500 ferritin 20 folate 16.4 Copper 135 zinc 55 CMP notable for glucose 147 protein 8.3  December 16, 2022 Feraheme 510 mg  December 20, 2022: Feraheme 510 mg  December 27 2022:   Reviewed results of labs since last visit.  Gets B12 injections monthly by PCP.  Feels a bit better since receiving IV iron.  Will refer to GI for consideration of endoscopy to evaluate IDA.  To see urology regarding elevated PSA  WBC 7.7 Hgb 11.0 PLT 347; 77 segs 11 lymph 9 mono 2 eos 1 baso  Ferritin 570  March 06 2023:  Hgb 11.8  March 08 2023:  EGD- Multiple gastric erosions.  1.1 cm distal esophageal stricture- dilated.  Colonoscopy- Diminutive colon polyp removed.  Scattered sigmoid diverticula  Mar 27 2023:  Scheduled follow up for management of anemia.  Reviewed results of previous labs.  Discussed mild anemia and indications to treat with ESA.  Taking iron sulfate 325 mg bid .  Since last visit underwent prostate bx which patient says was good.  Reviewed results of EGD and colonoscopy with patient.  Noted to have some gastritis attributed to ibuprofen which he takes occasionally for pain mainly from left foot.  Now using tylenol. Instructed patient to take iron only once daily; he is tolerating iron sulfate 325 mg without GI upset   Review of Systems - Oncology  MEDICAL HISTORY: Past Medical History:  Diagnosis Date   B12 deficiency anemia    Colon polyps  Diabetes (HCC)    Diabetic foot ulcer (HCC)    Diverticulosis    Hyperlipidemia    Hypertension    Iron deficiency anemia     SURGICAL HISTORY: Past Surgical History:  Procedure Laterality Date   COLONOSCOPY     left metatarsal resection  09/10/2020   left sesamoidectomy  07/09/2020   TONSILLECTOMY      SOCIAL HISTORY: Social History   Socioeconomic History   Marital status: Married    Spouse name: Not on file   Number of children: Not on file   Years of education: Not on file   Highest education  level: Not on file  Occupational History   Not on file  Tobacco Use   Smoking status: Never   Smokeless tobacco: Never  Vaping Use   Vaping Use: Never used  Substance and Sexual Activity   Alcohol use: Not Currently   Drug use: Never   Sexual activity: Not on file  Other Topics Concern   Not on file  Social History Narrative   Not on file   Social Determinants of Health   Financial Resource Strain: Not on file  Food Insecurity: No Food Insecurity (12/13/2022)   Hunger Vital Sign    Worried About Running Out of Food in the Last Year: Never true    Ran Out of Food in the Last Year: Never true  Transportation Needs: No Transportation Needs (12/13/2022)   PRAPARE - Administrator, Civil Service (Medical): No    Lack of Transportation (Non-Medical): No  Physical Activity: Not on file  Stress: Not on file  Social Connections: Not on file  Intimate Partner Violence: Not At Risk (12/13/2022)   Humiliation, Afraid, Rape, and Kick questionnaire    Fear of Current or Ex-Partner: No    Emotionally Abused: No    Physically Abused: No    Sexually Abused: No    FAMILY HISTORY Family History  Problem Relation Age of Onset   Bone cancer Mother    Hypertension Father    Stroke Father    Diabetes Brother    Breast cancer Paternal Grandmother    Diabetes Daughter     ALLERGIES:  has No Known Allergies.  MEDICATIONS:  Current Outpatient Medications  Medication Sig Dispense Refill   amLODipine (NORVASC) 5 MG tablet      atorvastatin (LIPITOR) 80 MG tablet      Cyanocobalamin (B-12 COMPLIANCE INJECTION IJ) Inject as directed.     ferrous sulfate 325 (65 FE) MG EC tablet Take 325 mg by mouth in the morning and at bedtime.     finasteride (PROSCAR) 5 MG tablet Take 5 mg by mouth daily.     ibuprofen (ADVIL) 800 MG tablet Take 800 mg by mouth 3 (three) times daily.     insulin degludec (TRESIBA FLEXTOUCH) 100 UNIT/ML FlexTouch Pen Inject 45 Units into the skin at bedtime.      lisinopril (ZESTRIL) 20 MG tablet      metFORMIN (GLUCOPHAGE-XR) 500 MG 24 hr tablet Take 500 mg by mouth 2 (two) times daily with a meal.     tamsulosin (FLOMAX) 0.4 MG CAPS capsule Take 0.4 mg by mouth daily.     No current facility-administered medications for this visit.    PHYSICAL EXAMINATION:  ECOG PERFORMANCE STATUS: 1 - Symptomatic but completely ambulatory   Vitals:   03/27/23 1043  BP: (!) 162/55  Pulse: 78  Resp: 18  Temp: 98 F (36.7 C)  SpO2: 97%  Filed Weights   03/27/23 1043  Weight: 168 lb (76.2 kg)     Physical Exam Vitals and nursing note reviewed.  Constitutional:      General: He is not in acute distress.    Appearance: Normal appearance. He is normal weight. He is not ill-appearing or diaphoretic.  HENT:     Head: Normocephalic and atraumatic.     Right Ear: External ear normal.     Left Ear: External ear normal.     Nose: Nose normal.  Eyes:     General: No scleral icterus.    Conjunctiva/sclera: Conjunctivae normal.     Pupils: Pupils are equal, round, and reactive to light.  Cardiovascular:     Rate and Rhythm: Normal rate and regular rhythm.     Heart sounds:     No friction rub. No gallop.  Pulmonary:     Effort: Pulmonary effort is normal. No respiratory distress.     Breath sounds: Normal breath sounds. No stridor. No wheezing.  Abdominal:     Palpations: Abdomen is soft.     Tenderness: There is no abdominal tenderness. There is no guarding or rebound.  Musculoskeletal:        General: No deformity or signs of injury.     Cervical back: Normal range of motion and neck supple. No rigidity or tenderness.     Right lower leg: No edema.     Left lower leg: No edema.  Lymphadenopathy:     Head:     Right side of head: No submental, submandibular, tonsillar, preauricular, posterior auricular or occipital adenopathy.     Left side of head: No submental, submandibular, tonsillar, preauricular, posterior auricular or occipital  adenopathy.     Cervical: No cervical adenopathy.     Right cervical: No superficial, deep or posterior cervical adenopathy.    Left cervical: No superficial, deep or posterior cervical adenopathy.     Upper Body:     Right upper body: No supraclavicular or axillary adenopathy.     Left upper body: No supraclavicular or axillary adenopathy.     Lower Body: No right inguinal adenopathy. No left inguinal adenopathy.  Skin:    General: Skin is warm.     Coloration: Skin is not jaundiced.     Findings: No bruising.  Neurological:     General: No focal deficit present.     Mental Status: He is alert and oriented to person, place, and time.     Cranial Nerves: No cranial nerve deficit.  Psychiatric:        Mood and Affect: Mood normal.        Behavior: Behavior normal.        Thought Content: Thought content normal.        Judgment: Judgment normal.      LABORATORY DATA: I have personally reviewed the data as listed:  No visits with results within 1 Month(s) from this visit.  Latest known visit with results is:  Office Visit on 12/27/2022  Component Date Value Ref Range Status   WBC 12/27/2022 7.7  4.0 - 10.5 K/uL Final   RBC 12/27/2022 4.46  4.22 - 5.81 MIL/uL Final   Hemoglobin 12/27/2022 11.0 (L)  13.0 - 17.0 g/dL Final   HCT 40/08/2724 35.8 (L)  39.0 - 52.0 % Final   MCV 12/27/2022 80.3  80.0 - 100.0 fL Final   MCH 12/27/2022 24.7 (L)  26.0 - 34.0 pg Final   MCHC 12/27/2022 30.7  30.0 -  36.0 g/dL Final   RDW 16/08/9603 19.8 (H)  11.5 - 15.5 % Final   Platelets 12/27/2022 347  150 - 400 K/uL Final   nRBC 12/27/2022 0.0  0.0 - 0.2 % Final   Neutrophils Relative % 12/27/2022 77  % Final   Neutro Abs 12/27/2022 6.0  1.7 - 7.7 K/uL Final   Lymphocytes Relative 12/27/2022 11  % Final   Lymphs Abs 12/27/2022 0.9  0.7 - 4.0 K/uL Final   Monocytes Relative 12/27/2022 9  % Final   Monocytes Absolute 12/27/2022 0.7  0.1 - 1.0 K/uL Final   Eosinophils Relative 12/27/2022 2  % Final    Eosinophils Absolute 12/27/2022 0.1  0.0 - 0.5 K/uL Final   Basophils Relative 12/27/2022 1  % Final   Basophils Absolute 12/27/2022 0.1  0.0 - 0.1 K/uL Final   Immature Granulocytes 12/27/2022 0  % Final   Abs Immature Granulocytes 12/27/2022 0.03  0.00 - 0.07 K/uL Final   Performed at [facility], [address], [place], [place] [address]    RADIOGRAPHIC STUDIES: I have personally reviewed the radiological images as listed and agree with the findings in the report  No results found.  ASSESSMENT/PLAN   82 y.o. male with edical history notable for diabetes mellitus type 2, Charcot joint, hyperlipidemia, hypertension, diabetic foot ulcer allergic rhinitis.  Patient seen for evaluation and management of anemia  Anemia:   December 13 2022:  Secondary to iron deficiency from GI blood loss despite oral iron.  Patient has history of colon polyps.  He is also on ASA daily.   December 16, 2022 Feraheme 510 mg   December 20, 2022: Feraheme 510 mg  February 05 2023- EGD demonstrated gastric erosions secondary to NSAIDS.  Colonoscopy revealed diminutive polyp and scattered sigmoid diverticula March 06 2023- Hgb 11.8 Mar 27 2023- No longer taking NSAIDS.  Continue oral iron sulfate daily    Elevated PSA:    Mar 27 2023- Saw urology since last visit.  Will obtain records regarding prostate bx  Cancer Staging  No matching staging information was found for the patient.   No problem-specific Assessment & Plan notes found for this encounter.   Orders Placed This Encounter  Procedures   CBC with Differential/Platelet    Standing Status:   Standing    Number of Occurrences:   50    Standing Expiration Date:   03/26/2024   Ferritin    Standing Status:   Standing    Number of Occurrences:   50    Standing Expiration Date:   03/26/2024   Vitamin B12    Standing Status:   Standing    Number of Occurrences:   50    Standing Expiration Date:   03/26/2024   Obtain medical records     Please obtain recent records from urology Thanks    30  minutes was spent in patient care.  This included time spent preparing to see the patient (e.g., review of tests), obtaining and/or reviewing separately obtained history, counseling and educating the patient/family/caregiver, ordering medications, tests, or procedures; documenting clinical information in the electronic or other health record, independently interpreting results and communicating results to the patient/family/caregiver as well as coordination of care.      All questions were answered. The patient knows to call the clinic with any problems, questions or concerns.  This note was electronically signed.    Loni Muse, MD  03/27/2023 11:10 AM

## 2023-03-28 ENCOUNTER — Telehealth: Payer: Self-pay | Admitting: Oncology

## 2023-03-28 NOTE — Telephone Encounter (Signed)
03/28/23 Next appt scheduled and confirmed with patient

## 2023-04-19 DIAGNOSIS — D519 Vitamin B12 deficiency anemia, unspecified: Secondary | ICD-10-CM | POA: Diagnosis not present

## 2023-04-24 DIAGNOSIS — D649 Anemia, unspecified: Secondary | ICD-10-CM | POA: Diagnosis not present

## 2023-05-01 ENCOUNTER — Ambulatory Visit (INDEPENDENT_AMBULATORY_CARE_PROVIDER_SITE_OTHER): Payer: Medicare Other | Admitting: Podiatry

## 2023-05-01 ENCOUNTER — Ambulatory Visit (INDEPENDENT_AMBULATORY_CARE_PROVIDER_SITE_OTHER): Payer: Medicare Other

## 2023-05-01 ENCOUNTER — Encounter: Payer: Self-pay | Admitting: Oncology

## 2023-05-01 DIAGNOSIS — E11610 Type 2 diabetes mellitus with diabetic neuropathic arthropathy: Secondary | ICD-10-CM | POA: Diagnosis not present

## 2023-05-01 DIAGNOSIS — M14672 Charcot's joint, left ankle and foot: Secondary | ICD-10-CM

## 2023-05-01 DIAGNOSIS — M1460 Charcot's joint, unspecified site: Secondary | ICD-10-CM | POA: Diagnosis not present

## 2023-05-01 DIAGNOSIS — E1140 Type 2 diabetes mellitus with diabetic neuropathy, unspecified: Secondary | ICD-10-CM

## 2023-05-01 NOTE — Progress Notes (Signed)
  Subjective:  Patient ID: James Norton., male    DOB: 1941/08/31,  MRN: [id number]   82 y.o. male presents with concern for left foot and ankle swelling.  Does have a history of Charcot.  History confirmed with patient. Patient presenting for follow-up of Charcot arthropathy left foot.  He ports that since March she has had increasing redness and swelling in the left foot and ankle area.  Has been wearing good supportive work boots and custom shoes.  No issues.  Denies any calluses or pain in the left foot.  Has noticed his left lower extremity seeming to get shorter than the right.  Objective:  Physical Exam: warm, good capillary refill nail exam onychomycosis of the toenails, onycholysis, and dystrophic nails DP pulses palpable, PT pulses palpable, and protective sensation absent Left Foot:  Nails of normal length, no issues. No pre ulcerative calluses.  Attention directed to the left foot and ankle there is noted to be increased erythema and significant edema both of the lateral midfoot as well as the ankle.  There is loss of the longitudinal arch and there is now completely flat foot to the left foot.  Left limb is shorter than right Right Foot: Nails of normal length.   05/01/2023 XR 3 views left foot and ankle weightbearing. findings: Compared to radiographs taken 1 year ago there is noted to be significant breakdown through the ankle with complete bony destruction of the talus and subsidence of the tibial plafond into the talus.  Significant radiographic evidence of acute active Charcot process  Assessment:   1. Charcot's joint arthropathy in type 2 diabetes mellitus (HCC)   2. Diabetic neuropathy, painful (HCC)   3. Charcot's joint     Plan:  Patient was evaluated and treated and all questions answered.  #Charcot Left foot -Significantly worsened from prior in regards to Charcot breakdown of the left ankle -Discussed with the patient that he does have him significant  progression of his Charcot arthropathy in the left rear foot. -Explained that this is very dangerous and could ultimately progressed to loss of his limb if he develops any ulcerations or there is continued breakdown -Explained the need for Charcot resistant orthotic walker device to be manufactured and provided a prescription to [facility] to get 1 of these. -Patient will follow-up in 3 months for repeat examination. -Monitor for painful lesions or worsening deformity of the left foot call with any questions or concerns arise.  Return in about 3 months (around 08/01/2023).         Corinna Gab, DPM [facility]

## 2023-05-08 DIAGNOSIS — K2970 Gastritis, unspecified, without bleeding: Secondary | ICD-10-CM | POA: Diagnosis not present

## 2023-05-08 DIAGNOSIS — K5790 Diverticulosis of intestine, part unspecified, without perforation or abscess without bleeding: Secondary | ICD-10-CM | POA: Diagnosis not present

## 2023-05-08 DIAGNOSIS — D509 Iron deficiency anemia, unspecified: Secondary | ICD-10-CM | POA: Diagnosis not present

## 2023-05-08 DIAGNOSIS — D519 Vitamin B12 deficiency anemia, unspecified: Secondary | ICD-10-CM | POA: Diagnosis not present

## 2023-05-08 DIAGNOSIS — K222 Esophageal obstruction: Secondary | ICD-10-CM | POA: Diagnosis not present

## 2023-05-11 DIAGNOSIS — E785 Hyperlipidemia, unspecified: Secondary | ICD-10-CM | POA: Diagnosis not present

## 2023-05-11 DIAGNOSIS — D519 Vitamin B12 deficiency anemia, unspecified: Secondary | ICD-10-CM | POA: Diagnosis not present

## 2023-05-11 DIAGNOSIS — D509 Iron deficiency anemia, unspecified: Secondary | ICD-10-CM | POA: Diagnosis not present

## 2023-05-11 DIAGNOSIS — I10 Essential (primary) hypertension: Secondary | ICD-10-CM | POA: Diagnosis not present

## 2023-05-11 DIAGNOSIS — E11610 Type 2 diabetes mellitus with diabetic neuropathic arthropathy: Secondary | ICD-10-CM | POA: Diagnosis not present

## 2023-05-11 DIAGNOSIS — E1142 Type 2 diabetes mellitus with diabetic polyneuropathy: Secondary | ICD-10-CM | POA: Diagnosis not present

## 2023-05-26 ENCOUNTER — Encounter: Payer: Self-pay | Admitting: Oncology

## 2023-05-28 ENCOUNTER — Encounter: Payer: Self-pay | Admitting: Oncology

## 2023-05-31 DIAGNOSIS — D519 Vitamin B12 deficiency anemia, unspecified: Secondary | ICD-10-CM | POA: Diagnosis not present

## 2023-06-05 DIAGNOSIS — D519 Vitamin B12 deficiency anemia, unspecified: Secondary | ICD-10-CM | POA: Diagnosis not present

## 2023-06-05 DIAGNOSIS — E785 Hyperlipidemia, unspecified: Secondary | ICD-10-CM | POA: Diagnosis not present

## 2023-06-05 DIAGNOSIS — E1142 Type 2 diabetes mellitus with diabetic polyneuropathy: Secondary | ICD-10-CM | POA: Diagnosis not present

## 2023-06-05 DIAGNOSIS — R972 Elevated prostate specific antigen [PSA]: Secondary | ICD-10-CM | POA: Diagnosis not present

## 2023-06-05 DIAGNOSIS — D509 Iron deficiency anemia, unspecified: Secondary | ICD-10-CM | POA: Diagnosis not present

## 2023-06-08 DIAGNOSIS — I10 Essential (primary) hypertension: Secondary | ICD-10-CM | POA: Diagnosis not present

## 2023-06-08 DIAGNOSIS — D519 Vitamin B12 deficiency anemia, unspecified: Secondary | ICD-10-CM | POA: Diagnosis not present

## 2023-06-08 DIAGNOSIS — E1142 Type 2 diabetes mellitus with diabetic polyneuropathy: Secondary | ICD-10-CM | POA: Diagnosis not present

## 2023-06-08 DIAGNOSIS — Z9181 History of falling: Secondary | ICD-10-CM | POA: Diagnosis not present

## 2023-06-08 DIAGNOSIS — D509 Iron deficiency anemia, unspecified: Secondary | ICD-10-CM | POA: Diagnosis not present

## 2023-06-08 DIAGNOSIS — E785 Hyperlipidemia, unspecified: Secondary | ICD-10-CM | POA: Diagnosis not present

## 2023-06-08 DIAGNOSIS — E875 Hyperkalemia: Secondary | ICD-10-CM | POA: Diagnosis not present

## 2023-06-23 NOTE — Progress Notes (Signed)
[facility] Cancer Follow up Visit:  Patient Care Team: Paulina Fusi, MD as PCP - General (Internal Medicine)  CHIEF COMPLAINTS/PURPOSE OF CONSULTATION:  HISTORY OF PRESENTING ILLNESS: James Norton. 82 y.o. male is here because of anemia Medical history notable for diabetes mellitus type 2, Charcot joint, hyperlipidemia, hypertension, diabetic foot ulcer allergic rhinitis  February 21, 2022 WBC 6.0 hemoglobin 12.9 MCV 82 platelet count 273 normal differential  August 29, 2022 WBC 8.3 hemoglobin 12.2 MCV 80 platelet count 368 normal differential  December 01, 2022 WBC 10.0 hemoglobin 11.6 MCV 76 platelet count 4 9; 79 segs 11 lymphs 8 monos 1 EO 1 basophil Iron saturation 8% B12 433 Hemoglobin A1c 8.7 CMP notable for creatinine 0.82  December 13, 2022: [facility] Hematology Consult  Has been taking oral iron for a couple of years.  Has never received IV iron nor required PRBC's in the past.   Has  tolerated oral iron.  Has a normal diet.  No history of hemorrhage postoperatively requiring transfusion.   No hematochezia, melena, hemoptysis, hematuria.   No /has history of intra-articular or soft tissue bleeding.  Is not taking oral anticoagulants.  Takes ASA daily.  Takes Ibuprofen PRN for neuropathy.  No history of abnormal bleeding in family members.  Patient does not have fatigue, pallor,  DOE, decreased performance status, pica to ice/starch/dirt.    Last colonoscopy was about 5 yrs ago which was negative for polyps.  On previous colonoscopies he had polyps which were removed.  He has not undergone an EGD.  Social:  Married.  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  Tobacco none.  EtOH none.   WBC 8.9 hemoglobin 12.0 MCV 79 platelet count 394; 82 segs 10 lymphs 6 monos EO 1 basophil burr cells noted.  Reticulocyte count 1.7% Coombs test negative haptoglobin 234 SPEP with IEP negative for paraprotein serum free kappa 47.5 lambda 27.4 with a kappa lambda 1.73 IgG 1576  IgA 270 IgM 82 B12 greater than 7500 ferritin 20 folate 16.4 Copper 135 zinc 55 CMP notable for glucose 147 protein 8.3  December 16, 2022 Feraheme 510 mg  December 20, 2022: Feraheme 510 mg  December 27 2022:   Reviewed results of labs since last visit.  Gets B12 injections monthly by PCP.  Feels a bit better since receiving IV iron.  Will refer to GI for consideration of endoscopy to evaluate IDA.  To see urology regarding elevated PSA  WBC 7.7 Hgb 11.0 PLT 347; 77 segs 11 lymph 9 mono 2 eos 1 baso  Ferritin 570  March 06 2023:  Hgb 11.8  March 08 2023:  EGD- Multiple gastric erosions.  1.1 cm distal esophageal stricture- dilated.  Colonoscopy- Diminutive colon polyp removed.  Scattered sigmoid diverticula  Mar 27 2023:    Taking iron sulfate 325 mg bid .  Since last visit underwent prostate bx which patient says was good.  Reviewed results of EGD and colonoscopy with patient.  Noted to have some gastritis attributed to ibuprofen which he takes occasionally for pain mainly from left foot.  Now using tylenol. Instructed patient to take iron only once daily; he is tolerating iron sulfate 325 mg without GI upset Hemoglobin 11.3 MCV 82 Ferritin 187 B12 428  June 05 2023: Hemoglobin 11.0 MCV 82.  B12 742.  Saturation 15%.  PSA 1.8  June 27, 2023:  Scheduled follow up for management of anemia.  Receives B12 injections monthly from PCP.  Continues on iron sulfate 325  mg daily which he is tolerating well.   (Labs recently drawn by PCP and recorded in [facility] so not repeated)   Review of Systems - Oncology  MEDICAL HISTORY: Past Medical History:  Diagnosis Date   B12 deficiency anemia    Colon polyps    Diabetes (HCC)    Diabetic foot ulcer (HCC)    Diverticulosis    Hyperlipidemia    Hypertension    Iron deficiency anemia     SURGICAL HISTORY: Past Surgical History:  Procedure Laterality Date   COLONOSCOPY     left metatarsal resection  09/10/2020   left sesamoidectomy   07/09/2020   TONSILLECTOMY      SOCIAL HISTORY: Social History   Socioeconomic History   Marital status: Married    Spouse name: Not on file   Number of children: Not on file   Years of education: Not on file   Highest education level: Not on file  Occupational History   Not on file  Tobacco Use   Smoking status: Never   Smokeless tobacco: Never  Vaping Use   Vaping status: Never Used  Substance and Sexual Activity   Alcohol use: Not Currently   Drug use: Never   Sexual activity: Not on file  Other Topics Concern   Not on file  Social History Narrative   Not on file   Social Determinants of Health   Financial Resource Strain: Not on file  Food Insecurity: No Food Insecurity (12/13/2022)   Hunger Vital Sign    Worried About Running Out of Food in the Last Year: Never true    Ran Out of Food in the Last Year: Never true  Transportation Needs: No Transportation Needs (12/13/2022)   PRAPARE - Administrator, Civil Service (Medical): No    Lack of Transportation (Non-Medical): No  Physical Activity: Not on file  Stress: Not on file  Social Connections: Not on file  Intimate Partner Violence: Not At Risk (12/13/2022)   Humiliation, Afraid, Rape, and Kick questionnaire    Fear of Current or Ex-Partner: No    Emotionally Abused: No    Physically Abused: No    Sexually Abused: No    FAMILY HISTORY Family History  Problem Relation Age of Onset   Bone cancer Mother    Hypertension Father    Stroke Father    Diabetes Brother    Breast cancer Paternal Grandmother    Diabetes Daughter     ALLERGIES:  has No Known Allergies.  MEDICATIONS:  Current Outpatient Medications  Medication Sig Dispense Refill   amLODipine (NORVASC) 5 MG tablet      atorvastatin (LIPITOR) 80 MG tablet      Cyanocobalamin (B-12 COMPLIANCE INJECTION IJ) Inject as directed.     ferrous sulfate 325 (65 FE) MG EC tablet Take 325 mg by mouth in the morning and at bedtime.      finasteride (PROSCAR) 5 MG tablet Take 5 mg by mouth daily.     ibuprofen (ADVIL) 800 MG tablet Take 800 mg by mouth 3 (three) times daily.     insulin degludec (TRESIBA FLEXTOUCH) 100 UNIT/ML FlexTouch Pen Inject 45 Units into the skin at bedtime.     lisinopril (ZESTRIL) 20 MG tablet      metFORMIN (GLUCOPHAGE-XR) 500 MG 24 hr tablet Take 500 mg by mouth 2 (two) times daily with a meal.     omeprazole (PRILOSEC) 40 MG capsule Take 40 mg by mouth daily.  tamsulosin (FLOMAX) 0.4 MG CAPS capsule Take 0.4 mg by mouth daily.     No current facility-administered medications for this visit.    PHYSICAL EXAMINATION:  ECOG PERFORMANCE STATUS: 1 - Symptomatic but completely ambulatory   Vitals:   06/27/23 1005  BP: (!) 145/65  Pulse: 65  Resp: 16  SpO2: 98%     Filed Weights   06/27/23 1005  Weight: 168 lb 3.2 oz (76.3 kg)      Physical Exam Vitals and nursing note reviewed.  Constitutional:      General: He is not in acute distress.    Appearance: Normal appearance. He is normal weight. He is not ill-appearing or diaphoretic.  HENT:     Head: Normocephalic and atraumatic.     Right Ear: External ear normal.     Left Ear: External ear normal.     Nose: Nose normal.  Eyes:     General: No scleral icterus.    Conjunctiva/sclera: Conjunctivae normal.     Pupils: Pupils are equal, round, and reactive to light.  Cardiovascular:     Rate and Rhythm: Normal rate and regular rhythm.     Heart sounds:     No friction rub. No gallop.  Pulmonary:     Effort: Pulmonary effort is normal. No respiratory distress.     Breath sounds: Normal breath sounds. No stridor. No wheezing.  Abdominal:     Palpations: Abdomen is soft.     Tenderness: There is no abdominal tenderness. There is no guarding or rebound.  Musculoskeletal:        General: No deformity or signs of injury.     Cervical back: Normal range of motion and neck supple. No rigidity or tenderness.     Right lower leg: No  edema.     Left lower leg: No edema.  Lymphadenopathy:     Head:     Right side of head: No submental, submandibular, tonsillar, preauricular, posterior auricular or occipital adenopathy.     Left side of head: No submental, submandibular, tonsillar, preauricular, posterior auricular or occipital adenopathy.     Cervical: No cervical adenopathy.     Right cervical: No superficial, deep or posterior cervical adenopathy.    Left cervical: No superficial, deep or posterior cervical adenopathy.     Upper Body:     Right upper body: No supraclavicular or axillary adenopathy.     Left upper body: No supraclavicular or axillary adenopathy.     Lower Body: No right inguinal adenopathy. No left inguinal adenopathy.  Skin:    General: Skin is warm.     Coloration: Skin is not jaundiced.     Findings: No bruising.  Neurological:     General: No focal deficit present.     Mental Status: He is alert and oriented to person, place, and time.     Cranial Nerves: No cranial nerve deficit.  Psychiatric:        Mood and Affect: Mood normal.        Behavior: Behavior normal.        Thought Content: Thought content normal.        Judgment: Judgment normal.      LABORATORY DATA: I have personally reviewed the data as listed:  No visits with results within 1 Month(s) from this visit.  Latest known visit with results is:  Appointment on 03/27/2023  Component Date Value Ref Range Status   Vitamin B-12 03/27/2023 428  180 - 914 pg/mL Final  Comment: (NOTE) This assay is not validated for testing neonatal or myeloproliferative syndrome specimens for Vitamin B12 levels. Performed at [facility], [address] [place], [place] [address]    Ferritin 03/27/2023 187  24 - 336 ng/mL Final   Performed at [facility], [address], [place], [place] [address]   WBC 03/27/2023 8.9  4.0 - 10.5 K/uL Final   RBC 03/27/2023 4.34  4.22 - 5.81 MIL/uL Final    Hemoglobin 03/27/2023 11.3 (L)  13.0 - 17.0 g/dL Final   HCT 09/81/1914 35.6 (L)  39.0 - 52.0 % Final   MCV 03/27/2023 82.0  80.0 - 100.0 fL Final   MCH 03/27/2023 26.0  26.0 - 34.0 pg Final   MCHC 03/27/2023 31.7  30.0 - 36.0 g/dL Final   RDW 78/29/5621 17.8 (H)  11.5 - 15.5 % Final   Platelets 03/27/2023 335  150 - 400 K/uL Final   nRBC 03/27/2023 0.0  0.0 - 0.2 % Final   Neutrophils Relative % 03/27/2023 84  % Final   Neutro Abs 03/27/2023 7.5  1.7 - 7.7 K/uL Final   Lymphocytes Relative 03/27/2023 8  % Final   Lymphs Abs 03/27/2023 0.7  0.7 - 4.0 K/uL Final   Monocytes Relative 03/27/2023 7  % Final   Monocytes Absolute 03/27/2023 0.6  0.1 - 1.0 K/uL Final   Eosinophils Relative 03/27/2023 0  % Final   Eosinophils Absolute 03/27/2023 0.0  0.0 - 0.5 K/uL Final   Basophils Relative 03/27/2023 1  % Final   Basophils Absolute 03/27/2023 0.1  0.0 - 0.1 K/uL Final   Immature Granulocytes 03/27/2023 0  % Final   Abs Immature Granulocytes 03/27/2023 0.01  0.00 - 0.07 K/uL Final   Performed at [facility], [address] [place], [place] [address]    RADIOGRAPHIC STUDIES: I have personally reviewed the radiological images as listed and agree with the findings in the report  No results found.  ASSESSMENT/PLAN   82 y.o. male with edical history notable for diabetes mellitus type 2, Charcot joint, hyperlipidemia, hypertension, diabetic foot ulcer allergic rhinitis.  Patient seen for evaluation and management of anemia  Anemia:   December 13 2022:  Secondary to iron deficiency from GI blood loss despite oral iron.  Patient has history of colon polyps.  He is also on ASA daily.   December 16, 2022 Feraheme 510 mg   December 20, 2022: Feraheme 510 mg  February 05 2023- EGD demonstrated gastric erosions secondary to NSAIDS.  Colonoscopy revealed diminutive polyp and scattered sigmoid diverticula March 06 2023- Hgb 11.8 Mar 27 2023- No longer taking NSAIDS.  Continue oral iron  sulfate daily   June 05 2023-hemoglobin 11.0.  Iron saturation 15%.  B12 742 June 27, 2023-continue iron sulfate daily as tolerated and B12 replacement per PCP  Elevated PSA:    Mar 27 2023- Saw urology since last visit.   June 05 2023 -PSA 1.8.  June 27 2023-can continue to follow PSA levels     Cancer Staging  No matching staging information was found for the patient.    No problem-specific Assessment & Plan notes found for this encounter.   No orders of the defined types were placed in this encounter.   20  minutes was spent in patient care.  This included time spent preparing to see the patient (e.g., review of tests), obtaining and/or reviewing separately obtained history, counseling and educating the patient/family/caregiver, ordering medications, tests, or  procedures; documenting clinical information in the electronic or other health record, independently interpreting results and communicating results to the patient/family/caregiver as well as coordination of care.      All questions were answered. The patient knows to call the clinic with any problems, questions or concerns.  This note was electronically signed.    Loni Muse, MD  07/04/2023 6:01 PM

## 2023-06-26 DIAGNOSIS — L578 Other skin changes due to chronic exposure to nonionizing radiation: Secondary | ICD-10-CM | POA: Diagnosis not present

## 2023-06-26 DIAGNOSIS — L821 Other seborrheic keratosis: Secondary | ICD-10-CM | POA: Diagnosis not present

## 2023-06-26 DIAGNOSIS — L720 Epidermal cyst: Secondary | ICD-10-CM | POA: Diagnosis not present

## 2023-06-26 DIAGNOSIS — L570 Actinic keratosis: Secondary | ICD-10-CM | POA: Diagnosis not present

## 2023-06-27 ENCOUNTER — Inpatient Hospital Stay: Payer: Medicare Other | Attending: Oncology | Admitting: Oncology

## 2023-06-27 ENCOUNTER — Telehealth: Payer: Self-pay | Admitting: Oncology

## 2023-06-27 ENCOUNTER — Inpatient Hospital Stay: Payer: Medicare Other

## 2023-06-27 VITALS — BP 145/65 | HR 65 | Resp 16 | Ht 67.0 in | Wt 168.2 lb

## 2023-06-27 DIAGNOSIS — D539 Nutritional anemia, unspecified: Secondary | ICD-10-CM | POA: Diagnosis not present

## 2023-06-27 DIAGNOSIS — R972 Elevated prostate specific antigen [PSA]: Secondary | ICD-10-CM

## 2023-06-27 DIAGNOSIS — Z789 Other specified health status: Secondary | ICD-10-CM

## 2023-06-27 NOTE — Telephone Encounter (Signed)
Patient has been scheduled. Aware of appt date and time    Message Received: Today Arville Care, CMA sent to [employer] Scheduling 4 months with labs

## 2023-07-04 ENCOUNTER — Encounter: Payer: Self-pay | Admitting: Oncology

## 2023-07-05 DIAGNOSIS — D519 Vitamin B12 deficiency anemia, unspecified: Secondary | ICD-10-CM | POA: Diagnosis not present

## 2023-07-13 DIAGNOSIS — N401 Enlarged prostate with lower urinary tract symptoms: Secondary | ICD-10-CM | POA: Diagnosis not present

## 2023-07-13 DIAGNOSIS — R972 Elevated prostate specific antigen [PSA]: Secondary | ICD-10-CM | POA: Diagnosis not present

## 2023-08-01 ENCOUNTER — Ambulatory Visit: Payer: BLUE CROSS/BLUE SHIELD | Admitting: Podiatry

## 2023-08-09 DIAGNOSIS — D519 Vitamin B12 deficiency anemia, unspecified: Secondary | ICD-10-CM | POA: Diagnosis not present

## 2023-08-30 DIAGNOSIS — H401131 Primary open-angle glaucoma, bilateral, mild stage: Secondary | ICD-10-CM | POA: Diagnosis not present

## 2023-08-30 DIAGNOSIS — H524 Presbyopia: Secondary | ICD-10-CM | POA: Diagnosis not present

## 2023-09-11 DIAGNOSIS — E1142 Type 2 diabetes mellitus with diabetic polyneuropathy: Secondary | ICD-10-CM | POA: Diagnosis not present

## 2023-09-11 DIAGNOSIS — E785 Hyperlipidemia, unspecified: Secondary | ICD-10-CM | POA: Diagnosis not present

## 2023-09-11 DIAGNOSIS — D509 Iron deficiency anemia, unspecified: Secondary | ICD-10-CM | POA: Diagnosis not present

## 2023-09-11 DIAGNOSIS — D519 Vitamin B12 deficiency anemia, unspecified: Secondary | ICD-10-CM | POA: Diagnosis not present

## 2023-09-13 DIAGNOSIS — I10 Essential (primary) hypertension: Secondary | ICD-10-CM | POA: Diagnosis not present

## 2023-09-13 DIAGNOSIS — E875 Hyperkalemia: Secondary | ICD-10-CM | POA: Diagnosis not present

## 2023-09-13 DIAGNOSIS — D519 Vitamin B12 deficiency anemia, unspecified: Secondary | ICD-10-CM | POA: Diagnosis not present

## 2023-09-13 DIAGNOSIS — R972 Elevated prostate specific antigen [PSA]: Secondary | ICD-10-CM | POA: Diagnosis not present

## 2023-09-13 DIAGNOSIS — E785 Hyperlipidemia, unspecified: Secondary | ICD-10-CM | POA: Diagnosis not present

## 2023-09-13 DIAGNOSIS — Z23 Encounter for immunization: Secondary | ICD-10-CM | POA: Diagnosis not present

## 2023-09-13 DIAGNOSIS — E1142 Type 2 diabetes mellitus with diabetic polyneuropathy: Secondary | ICD-10-CM | POA: Diagnosis not present

## 2023-10-03 DIAGNOSIS — Z23 Encounter for immunization: Secondary | ICD-10-CM | POA: Diagnosis not present

## 2023-10-04 DIAGNOSIS — Z9181 History of falling: Secondary | ICD-10-CM | POA: Diagnosis not present

## 2023-10-04 DIAGNOSIS — Z0000 Encounter for general adult medical examination without abnormal findings: Secondary | ICD-10-CM | POA: Diagnosis not present

## 2023-10-18 DIAGNOSIS — D519 Vitamin B12 deficiency anemia, unspecified: Secondary | ICD-10-CM | POA: Diagnosis not present

## 2023-10-26 ENCOUNTER — Inpatient Hospital Stay: Payer: Medicare Other

## 2023-10-26 ENCOUNTER — Encounter: Payer: Self-pay | Admitting: Oncology

## 2023-10-26 ENCOUNTER — Inpatient Hospital Stay: Payer: Medicare Other | Attending: Oncology | Admitting: Oncology

## 2023-10-26 VITALS — BP 174/60 | HR 72 | Resp 16 | Ht 67.0 in | Wt 174.6 lb

## 2023-10-26 DIAGNOSIS — R972 Elevated prostate specific antigen [PSA]: Secondary | ICD-10-CM | POA: Insufficient documentation

## 2023-10-26 DIAGNOSIS — L97509 Non-pressure chronic ulcer of other part of unspecified foot with unspecified severity: Secondary | ICD-10-CM | POA: Insufficient documentation

## 2023-10-26 DIAGNOSIS — K922 Gastrointestinal hemorrhage, unspecified: Secondary | ICD-10-CM | POA: Insufficient documentation

## 2023-10-26 DIAGNOSIS — E785 Hyperlipidemia, unspecified: Secondary | ICD-10-CM | POA: Insufficient documentation

## 2023-10-26 DIAGNOSIS — I10 Essential (primary) hypertension: Secondary | ICD-10-CM | POA: Diagnosis not present

## 2023-10-26 DIAGNOSIS — Z789 Other specified health status: Secondary | ICD-10-CM

## 2023-10-26 DIAGNOSIS — D500 Iron deficiency anemia secondary to blood loss (chronic): Secondary | ICD-10-CM | POA: Diagnosis not present

## 2023-10-26 DIAGNOSIS — D539 Nutritional anemia, unspecified: Secondary | ICD-10-CM | POA: Diagnosis not present

## 2023-10-26 DIAGNOSIS — E11621 Type 2 diabetes mellitus with foot ulcer: Secondary | ICD-10-CM | POA: Diagnosis not present

## 2023-10-26 NOTE — Progress Notes (Signed)
[facility] Cancer Follow up Visit:  Patient Care Team: Paulina Fusi, MD as PCP - General (Internal Medicine)  CHIEF COMPLAINTS/PURPOSE OF CONSULTATION:  HISTORY OF PRESENTING ILLNESS: James Norton. 82 y.o. male is here because of anemia Medical history notable for diabetes mellitus type 2, Charcot joint, hyperlipidemia, hypertension, diabetic foot ulcer allergic rhinitis  February 21, 2022 WBC 6.0 hemoglobin 12.9 MCV 82 platelet count 273 normal differential  August 29, 2022 WBC 8.3 hemoglobin 12.2 MCV 80 platelet count 368 normal differential  December 01, 2022 WBC 10.0 hemoglobin 11.6 MCV 76 platelet count 4 9; 79 segs 11 lymphs 8 monos 1 EO 1 basophil Iron saturation 8% B12 433 Hemoglobin A1c 8.7 CMP notable for creatinine 0.82  December 13, 2022: [facility] Hematology Consult  Has been taking oral iron for a couple of years.  Has never received IV iron nor required PRBC's in the past.   Has  tolerated oral iron.  Has a normal diet.  No history of hemorrhage postoperatively requiring transfusion.   No hematochezia, melena, hemoptysis, hematuria.   No /has history of intra-articular or soft tissue bleeding.  Is not taking oral anticoagulants.  Takes ASA daily.  Takes Ibuprofen PRN for neuropathy.  No history of abnormal bleeding in family members.  Patient does not have fatigue, pallor,  DOE, decreased performance status, pica to ice/starch/dirt.    Last colonoscopy was about 5 yrs ago which was negative for polyps.  On previous colonoscopies he had polyps which were removed.  He has not undergone an EGD.  Social:  Married.  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  Tobacco none.  EtOH none.   WBC 8.9 hemoglobin 12.0 MCV 79 platelet count 394; 82 segs 10 lymphs 6 monos EO 1 basophil burr cells noted.  Reticulocyte count 1.7% Coombs test negative haptoglobin 234 SPEP with IEP negative for paraprotein serum free kappa 47.5 lambda 27.4 with a kappa lambda 1.73 IgG 1576  IgA 270 IgM 82 B12 greater than 7500 ferritin 20 folate 16.4 Copper 135 zinc 55 CMP notable for glucose 147 protein 8.3  December 16, 2022 Feraheme 510 mg  December 20, 2022: Feraheme 510 mg  December 27 2022:   Reviewed results of labs since last visit.  Gets B12 injections monthly by PCP.  Feels a bit better since receiving IV iron.  Will refer to GI for consideration of endoscopy to evaluate IDA.  To see urology regarding elevated PSA  WBC 7.7 Hgb 11.0 PLT 347; 77 segs 11 lymph 9 mono 2 eos 1 baso  Ferritin 570  March 06 2023:  Hgb 11.8  March 08 2023:  EGD- Multiple gastric erosions.  1.1 cm distal esophageal stricture- dilated.  Colonoscopy- Diminutive colon polyp removed.  Scattered sigmoid diverticula  Mar 27 2023:    Taking iron sulfate 325 mg bid .  Since last visit underwent prostate bx which patient says was good.  Reviewed results of EGD and colonoscopy with patient.  Noted to have some gastritis attributed to ibuprofen which he takes occasionally for pain mainly from left foot.  Now using tylenol. Instructed patient to take iron only once daily; he is tolerating iron sulfate 325 mg without GI upset Hemoglobin 11.3 MCV 82 Ferritin 187 B12 428  June 05 2023: Hemoglobin 11.0 MCV 82.  B12 742.  Saturation 15%.  PSA 1.8  June 27, 2023:    Receives B12 injections monthly from PCP.  Continues on iron sulfate 325 mg daily which he is tolerating  well.   (Labs recently drawn by PCP and recorded in [facility] so not repeated)   September 11, 2023: WBC 5.4 hemoglobin 12.9 MCV 87 platelet count 275; 74 segs 15 lymphs 8 monos 2 eos 1 Baso B12 483 iron saturation 34% CMP notable for creatinine 0.81 alk phos 138  October 26 2023:  Scheduled follow up for management of anemia.  Reviewed results of labs with patient. He is busy making signs and Christmas presents.  Receiving B12 injections from PCP and takes iron sulfate 325 mg daily.    Follow up PRN.  Urology following PSA.  PCP managing  anemia which is stable.     Review of Systems - Oncology  MEDICAL HISTORY: Past Medical History:  Diagnosis Date   B12 deficiency anemia    Colon polyps    Diabetes (HCC)    Diabetic foot ulcer (HCC)    Diverticulosis    Hyperlipidemia    Hypertension    Iron deficiency anemia     SURGICAL HISTORY: Past Surgical History:  Procedure Laterality Date   COLONOSCOPY     left metatarsal resection  09/10/2020   left sesamoidectomy  07/09/2020   TONSILLECTOMY      SOCIAL HISTORY: Social History   Socioeconomic History   Marital status: Married    Spouse name: Not on file   Number of children: Not on file   Years of education: Not on file   Highest education level: Not on file  Occupational History   Not on file  Tobacco Use   Smoking status: Never   Smokeless tobacco: Never  Vaping Use   Vaping status: Never Used  Substance and Sexual Activity   Alcohol use: Not Currently   Drug use: Never   Sexual activity: Not on file  Other Topics Concern   Not on file  Social History Narrative   Not on file   Social Drivers of Health   Financial Resource Strain: Not on file  Food Insecurity: No Food Insecurity (12/13/2022)   Hunger Vital Sign    Worried About Running Out of Food in the Last Year: Never true    Ran Out of Food in the Last Year: Never true  Transportation Needs: No Transportation Needs (12/13/2022)   PRAPARE - Administrator, Civil Service (Medical): No    Lack of Transportation (Non-Medical): No  Physical Activity: Not on file  Stress: Not on file  Social Connections: Not on file  Intimate Partner Violence: Not At Risk (12/13/2022)   Humiliation, Afraid, Rape, and Kick questionnaire    Fear of Current or Ex-Partner: No    Emotionally Abused: No    Physically Abused: No    Sexually Abused: No    FAMILY HISTORY Family History  Problem Relation Age of Onset   Bone cancer Mother    Hypertension Father    Stroke Father    Diabetes Brother     Breast cancer Paternal Grandmother    Diabetes Daughter     ALLERGIES:  has no known allergies.  MEDICATIONS:  Current Outpatient Medications  Medication Sig Dispense Refill   amLODipine (NORVASC) 5 MG tablet      atorvastatin (LIPITOR) 80 MG tablet      Cyanocobalamin (B-12 COMPLIANCE INJECTION IJ) Inject as directed.     ferrous sulfate 325 (65 FE) MG EC tablet Take 325 mg by mouth in the morning and at bedtime.     finasteride (PROSCAR) 5 MG tablet Take 5 mg by mouth daily.  ibuprofen (ADVIL) 800 MG tablet Take 800 mg by mouth 3 (three) times daily.     insulin degludec (TRESIBA FLEXTOUCH) 100 UNIT/ML FlexTouch Pen Inject 45 Units into the skin at bedtime.     lisinopril (ZESTRIL) 20 MG tablet      metFORMIN (GLUCOPHAGE-XR) 500 MG 24 hr tablet Take 500 mg by mouth 2 (two) times daily with a meal.     omeprazole (PRILOSEC) 40 MG capsule Take 40 mg by mouth daily.     tamsulosin (FLOMAX) 0.4 MG CAPS capsule Take 0.4 mg by mouth daily.     No current facility-administered medications for this visit.    PHYSICAL EXAMINATION:  ECOG PERFORMANCE STATUS: 1 - Symptomatic but completely ambulatory   Vitals:   10/26/23 0836 10/26/23 0837  BP: (!) 178/56 (!) 174/60  Pulse: 72   Resp: 16   SpO2: 99%      Filed Weights   10/26/23 0836  Weight: 174 lb 9.6 oz (79.2 kg)      Physical Exam Vitals and nursing note reviewed.  Constitutional:      General: He is not in acute distress.    Appearance: Normal appearance. He is normal weight. He is not ill-appearing or diaphoretic.  HENT:     Head: Normocephalic and atraumatic.     Right Ear: External ear normal.     Left Ear: External ear normal.     Nose: Nose normal.  Eyes:     General: No scleral icterus.    Conjunctiva/sclera: Conjunctivae normal.     Pupils: Pupils are equal, round, and reactive to light.  Cardiovascular:     Rate and Rhythm: Normal rate and regular rhythm.     Heart sounds:     No friction rub. No  gallop.  Pulmonary:     Effort: Pulmonary effort is normal. No respiratory distress.     Breath sounds: Normal breath sounds. No stridor. No wheezing.  Abdominal:     Palpations: Abdomen is soft.     Tenderness: There is no abdominal tenderness. There is no guarding or rebound.  Musculoskeletal:        General: No deformity or signs of injury.     Cervical back: Normal range of motion and neck supple. No rigidity or tenderness.     Right lower leg: No edema.     Left lower leg: No edema.  Lymphadenopathy:     Head:     Right side of head: No submental, submandibular, tonsillar, preauricular, posterior auricular or occipital adenopathy.     Left side of head: No submental, submandibular, tonsillar, preauricular, posterior auricular or occipital adenopathy.     Cervical: No cervical adenopathy.     Right cervical: No superficial, deep or posterior cervical adenopathy.    Left cervical: No superficial, deep or posterior cervical adenopathy.     Upper Body:     Right upper body: No supraclavicular or axillary adenopathy.     Left upper body: No supraclavicular or axillary adenopathy.     Lower Body: No right inguinal adenopathy. No left inguinal adenopathy.  Skin:    General: Skin is warm.     Coloration: Skin is not jaundiced.     Findings: No bruising.  Neurological:     General: No focal deficit present.     Mental Status: He is alert and oriented to person, place, and time.     Cranial Nerves: No cranial nerve deficit.  Psychiatric:  Mood and Affect: Mood normal.        Behavior: Behavior normal.        Thought Content: Thought content normal.        Judgment: Judgment normal.      LABORATORY DATA: I have personally reviewed the data as listed:  No visits with results within 1 Month(s) from this visit.  Latest known visit with results is:  Appointment on 03/27/2023  Component Date Value Ref Range Status   Vitamin B-12 03/27/2023 428  180 - 914 pg/mL Final    Comment: (NOTE) This assay is not validated for testing neonatal or myeloproliferative syndrome specimens for Vitamin B12 levels. Performed at [facility], [address], [place], [place] [address]    Ferritin 03/27/2023 187  24 - 336 ng/mL Final   Performed at [facility], [address] [place], [place] [address]   WBC 03/27/2023 8.9  4.0 - 10.5 K/uL Final   RBC 03/27/2023 4.34  4.22 - 5.81 MIL/uL Final   Hemoglobin 03/27/2023 11.3 (L)  13.0 - 17.0 g/dL Final   HCT 09/81/1914 35.6 (L)  39.0 - 52.0 % Final   MCV 03/27/2023 82.0  80.0 - 100.0 fL Final   MCH 03/27/2023 26.0  26.0 - 34.0 pg Final   MCHC 03/27/2023 31.7  30.0 - 36.0 g/dL Final   RDW 78/29/5621 17.8 (H)  11.5 - 15.5 % Final   Platelets 03/27/2023 335  150 - 400 K/uL Final   nRBC 03/27/2023 0.0  0.0 - 0.2 % Final   Neutrophils Relative % 03/27/2023 84  % Final   Neutro Abs 03/27/2023 7.5  1.7 - 7.7 K/uL Final   Lymphocytes Relative 03/27/2023 8  % Final   Lymphs Abs 03/27/2023 0.7  0.7 - 4.0 K/uL Final   Monocytes Relative 03/27/2023 7  % Final   Monocytes Absolute 03/27/2023 0.6  0.1 - 1.0 K/uL Final   Eosinophils Relative 03/27/2023 0  % Final   Eosinophils Absolute 03/27/2023 0.0  0.0 - 0.5 K/uL Final   Basophils Relative 03/27/2023 1  % Final   Basophils Absolute 03/27/2023 0.1  0.0 - 0.1 K/uL Final   Immature Granulocytes 03/27/2023 0  % Final   Abs Immature Granulocytes 03/27/2023 0.01  0.00 - 0.07 K/uL Final   Performed at [facility], [address], [place], [place] [address]    RADIOGRAPHIC STUDIES: I have personally reviewed the radiological images as listed and agree with the findings in the report  No results found.  ASSESSMENT/PLAN   82 y.o. male with edical history notable for diabetes mellitus type 2, Charcot joint, hyperlipidemia, hypertension, diabetic foot ulcer allergic rhinitis.  Patient seen for evaluation and management of  anemia  Anemia:   December 13 2022:  Secondary to iron deficiency from GI blood loss despite oral iron.  Patient has history of colon polyps.  He is also on ASA daily.   December 16, 2022 Feraheme 510 mg   December 20, 2022: Feraheme 510 mg  February 05 2023- EGD demonstrated gastric erosions secondary to NSAIDS.  Colonoscopy revealed diminutive polyp and scattered sigmoid diverticula March 06 2023- Hgb 11.8 Mar 27 2023- No longer taking NSAIDS.  Continue oral iron sulfate daily   June 05 2023-hemoglobin 11.0.  Iron saturation 15%.  B12 742 June 27, 2023-continue iron sulfate daily as tolerated and B12 replacement per PCP September 11, 2023: WBC 5.4 hemoglobin 12.9 MCV 87 platelet count 275; 74 segs 15 lymphs  8 monos 2 eos 1 Baso B12 483 iron saturation 34% CMP notable for creatinine 0.81 alk phos 138  October 26 2023- Doing well.  Continue B12 injections and oral iron under direction of PCP   Elevated PSA:    Mar 27 2023- Saw urology since last visit.   June 05 2023 -PSA 1.8.  June 27 2023-can continue to follow PSA levels  October 26 2023- Recently underwent prostate bx by urology to evaluate elevated PSA  Follow up  October 26 2023- Since anemia under good control and Urology following PSA, can be seen in cancer center on a PRN basis.  Patient in agreement with this plan     Cancer Staging  No matching staging information was found for the patient.    No problem-specific Assessment & Plan notes found for this encounter.   No orders of the defined types were placed in this encounter.   20  minutes was spent in patient care.  This included time spent preparing to see the patient (e.g., review of tests), obtaining and/or reviewing separately obtained history, counseling and educating the patient/family/caregiver, ordering medications, tests, or procedures; documenting clinical information in the electronic or other health record, independently interpreting results and  communicating results to the patient/family/caregiver as well as coordination of care.      All questions were answered. The patient knows to call the clinic with any problems, questions or concerns.  This note was electronically signed.    Loni Muse, MD  10/26/2023 8:40 AM

## 2023-11-22 DIAGNOSIS — D519 Vitamin B12 deficiency anemia, unspecified: Secondary | ICD-10-CM | POA: Diagnosis not present

## 2023-12-27 DIAGNOSIS — I10 Essential (primary) hypertension: Secondary | ICD-10-CM | POA: Diagnosis not present

## 2023-12-27 DIAGNOSIS — E875 Hyperkalemia: Secondary | ICD-10-CM | POA: Diagnosis not present

## 2023-12-27 DIAGNOSIS — D509 Iron deficiency anemia, unspecified: Secondary | ICD-10-CM | POA: Diagnosis not present

## 2023-12-27 DIAGNOSIS — E1142 Type 2 diabetes mellitus with diabetic polyneuropathy: Secondary | ICD-10-CM | POA: Diagnosis not present

## 2023-12-27 DIAGNOSIS — R972 Elevated prostate specific antigen [PSA]: Secondary | ICD-10-CM | POA: Diagnosis not present

## 2023-12-27 DIAGNOSIS — E785 Hyperlipidemia, unspecified: Secondary | ICD-10-CM | POA: Diagnosis not present

## 2023-12-27 DIAGNOSIS — D519 Vitamin B12 deficiency anemia, unspecified: Secondary | ICD-10-CM | POA: Diagnosis not present

## 2024-01-31 DIAGNOSIS — D519 Vitamin B12 deficiency anemia, unspecified: Secondary | ICD-10-CM | POA: Diagnosis not present

## 2024-02-28 DIAGNOSIS — H401131 Primary open-angle glaucoma, bilateral, mild stage: Secondary | ICD-10-CM | POA: Diagnosis not present

## 2024-03-13 DIAGNOSIS — D519 Vitamin B12 deficiency anemia, unspecified: Secondary | ICD-10-CM | POA: Diagnosis not present

## 2024-04-12 DIAGNOSIS — E1142 Type 2 diabetes mellitus with diabetic polyneuropathy: Secondary | ICD-10-CM | POA: Diagnosis not present

## 2024-04-12 DIAGNOSIS — E785 Hyperlipidemia, unspecified: Secondary | ICD-10-CM | POA: Diagnosis not present

## 2024-04-12 DIAGNOSIS — D519 Vitamin B12 deficiency anemia, unspecified: Secondary | ICD-10-CM | POA: Diagnosis not present

## 2024-04-12 DIAGNOSIS — D509 Iron deficiency anemia, unspecified: Secondary | ICD-10-CM | POA: Diagnosis not present

## 2024-04-12 DIAGNOSIS — I10 Essential (primary) hypertension: Secondary | ICD-10-CM | POA: Diagnosis not present

## 2024-04-12 DIAGNOSIS — E875 Hyperkalemia: Secondary | ICD-10-CM | POA: Diagnosis not present

## 2024-04-17 DIAGNOSIS — D519 Vitamin B12 deficiency anemia, unspecified: Secondary | ICD-10-CM | POA: Diagnosis not present

## 2024-05-22 DIAGNOSIS — D519 Vitamin B12 deficiency anemia, unspecified: Secondary | ICD-10-CM | POA: Diagnosis not present

## 2024-06-25 DIAGNOSIS — R600 Localized edema: Secondary | ICD-10-CM | POA: Diagnosis not present

## 2024-06-25 DIAGNOSIS — L570 Actinic keratosis: Secondary | ICD-10-CM | POA: Diagnosis not present

## 2024-06-25 DIAGNOSIS — L578 Other skin changes due to chronic exposure to nonionizing radiation: Secondary | ICD-10-CM | POA: Diagnosis not present

## 2024-06-25 DIAGNOSIS — L821 Other seborrheic keratosis: Secondary | ICD-10-CM | POA: Diagnosis not present

## 2024-06-26 DIAGNOSIS — D519 Vitamin B12 deficiency anemia, unspecified: Secondary | ICD-10-CM | POA: Diagnosis not present

## 2024-07-22 DIAGNOSIS — D519 Vitamin B12 deficiency anemia, unspecified: Secondary | ICD-10-CM | POA: Diagnosis not present

## 2024-07-22 DIAGNOSIS — D509 Iron deficiency anemia, unspecified: Secondary | ICD-10-CM | POA: Diagnosis not present

## 2024-07-22 DIAGNOSIS — E785 Hyperlipidemia, unspecified: Secondary | ICD-10-CM | POA: Diagnosis not present

## 2024-07-22 DIAGNOSIS — E875 Hyperkalemia: Secondary | ICD-10-CM | POA: Diagnosis not present

## 2024-07-22 DIAGNOSIS — I10 Essential (primary) hypertension: Secondary | ICD-10-CM | POA: Diagnosis not present

## 2024-07-22 DIAGNOSIS — E1142 Type 2 diabetes mellitus with diabetic polyneuropathy: Secondary | ICD-10-CM | POA: Diagnosis not present

## 2024-07-31 DIAGNOSIS — D519 Vitamin B12 deficiency anemia, unspecified: Secondary | ICD-10-CM | POA: Diagnosis not present

## 2024-09-04 DIAGNOSIS — D519 Vitamin B12 deficiency anemia, unspecified: Secondary | ICD-10-CM | POA: Diagnosis not present

## 2024-09-10 DIAGNOSIS — Z23 Encounter for immunization: Secondary | ICD-10-CM | POA: Diagnosis not present

## 2024-10-09 DIAGNOSIS — Z9181 History of falling: Secondary | ICD-10-CM | POA: Diagnosis not present

## 2024-10-09 DIAGNOSIS — D519 Vitamin B12 deficiency anemia, unspecified: Secondary | ICD-10-CM | POA: Diagnosis not present

## 2024-10-09 DIAGNOSIS — Z0000 Encounter for general adult medical examination without abnormal findings: Secondary | ICD-10-CM | POA: Diagnosis not present

## 2024-10-21 ENCOUNTER — Ambulatory Visit (INDEPENDENT_AMBULATORY_CARE_PROVIDER_SITE_OTHER)

## 2024-10-21 ENCOUNTER — Encounter: Payer: Self-pay | Admitting: Podiatry

## 2024-10-21 ENCOUNTER — Ambulatory Visit: Admitting: Podiatry

## 2024-10-21 DIAGNOSIS — M1460 Charcot's joint, unspecified site: Secondary | ICD-10-CM | POA: Diagnosis not present

## 2024-10-21 DIAGNOSIS — M7752 Other enthesopathy of left foot: Secondary | ICD-10-CM

## 2024-10-21 DIAGNOSIS — M14672 Charcot's joint, left ankle and foot: Secondary | ICD-10-CM

## 2024-10-21 DIAGNOSIS — R972 Elevated prostate specific antigen [PSA]: Secondary | ICD-10-CM | POA: Diagnosis not present

## 2024-10-21 DIAGNOSIS — L97422 Non-pressure chronic ulcer of left heel and midfoot with fat layer exposed: Secondary | ICD-10-CM

## 2024-10-21 DIAGNOSIS — E1140 Type 2 diabetes mellitus with diabetic neuropathy, unspecified: Secondary | ICD-10-CM

## 2024-10-21 DIAGNOSIS — D519 Vitamin B12 deficiency anemia, unspecified: Secondary | ICD-10-CM | POA: Diagnosis not present

## 2024-10-21 DIAGNOSIS — E785 Hyperlipidemia, unspecified: Secondary | ICD-10-CM | POA: Diagnosis not present

## 2024-10-21 DIAGNOSIS — N401 Enlarged prostate with lower urinary tract symptoms: Secondary | ICD-10-CM | POA: Diagnosis not present

## 2024-10-21 DIAGNOSIS — E1142 Type 2 diabetes mellitus with diabetic polyneuropathy: Secondary | ICD-10-CM | POA: Diagnosis not present

## 2024-10-21 DIAGNOSIS — L080 Pyoderma: Secondary | ICD-10-CM | POA: Diagnosis not present

## 2024-10-21 DIAGNOSIS — D509 Iron deficiency anemia, unspecified: Secondary | ICD-10-CM | POA: Diagnosis not present

## 2024-10-21 LAB — MICROALBUMIN / CREATININE URINE RATIO
Albumin, Urine POC: 26.1
Creatinine, POC: 209 mg/dL
Microalb Creat Ratio: 12

## 2024-10-21 MED ORDER — TRAMADOL HCL 50 MG PO TABS: 50.0000 mg | ORAL_TABLET | Freq: Three times a day (TID) | ORAL | 0 refills | Status: AC | PRN

## 2024-10-21 MED ORDER — CEPHALEXIN 500 MG PO CAPS: 500.0000 mg | ORAL_CAPSULE | Freq: Three times a day (TID) | ORAL | 0 refills | Status: DC

## 2024-10-21 NOTE — Patient Instructions (Signed)
 Instructions for Wound Care  The most important step to healing a foot wound is to reduce the pressure on your foot - it is extremely important to stay off your foot as much as possible and wear the shoe/boot as instructed.  Cleanse your foot with saline wash or warm soapy water (dial antibacterial soap or similar), or wound cleanser.  Blot dry.  Apply hydrofera blue floam to your wound and cover with gauze and a bandage.  May hold bandage in place with Coban (self sticky wrap), Ace bandage or tape.  You may find dressing supplies at your local [employer], [employer], drug store or medical supply store. Change every 2-3 days  Monitor for any signs/symptoms of infection. If there is any increase in redness, red streaks, increase in drainage, warmth to your foot please give us  a call. Also, if you start to run a fever or have flu-like symptoms that can also be a sign of infection. Call the office immediately if any occur or go directly to the emergency room.   If you have any questions, please feel free to give us  a call at [phone] or if you are on MyChart you can always send me a message if needed.   Remain Non weight bearing to the left foot. Recommend finding a knee scooter on amazon to help out with this.

## 2024-10-21 NOTE — Progress Notes (Signed)
 Subjective:  Patient ID: James Morton Nola Mickey., male    DOB: March 10, 1941,  MRN: [id number]  Chief Complaint  Patient presents with   Foot Pain    Left foot, seen in 2024 by Dr. Malvin for charot's. Did not have much of an issue for awhile, then started having pain increasingly over the past month. Began having pain and swelling in the calf, denies redness or warmth to touch. FSBS this AM: 106 Last A1c: 7.5. Takes no anticoag. There is a large thickened callous on the lateral aspect of patient's foot.     Discussed the use of AI scribe software for clinical note transcription with the patient, who gave verbal consent to proceed.  History of Present Illness James Stefanski. is an 83 year old male with Charcot foot who presents with worsening foot deformity and pain.  He was diagnosed with Charcot foot 12 to 14 years ago. Over the past 1.5 years, without treatment, he has noticed progressive deformity with his foot rolling more to the outside and significant worsening pain in recent days. Tylenol  no longer helps. Barefoot walking is less painful than wearing shoes, but he avoids it due to instability.  He was last seen by Dr. Malvin in June 2024, was prescribed a Omelia boot at that time, states that he did not get this made as it was not covered by his insurance.  Has not seen anyone for his foot since that timeframe.  He has history of left first metatarsal head resection years ago which stemmed from wound infection and subsequent osteomyelitis.  He has diabetes, with last A1c 7.5 three months ago, and takes his usual diabetes medications and Tylenol . He denies heart, kidney, or lung disease and does not use blood thinners.  His brother had a right foot amputation due to poor blood flow, which heightens his concern about limb loss. 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 He drives and manages daily activities independently.       Objective:    Physical Exam EXTREMITIES: Callus on left foot fifth metatarsal base, warm, however no surrounding erythema, no malodor, no fluctuance, no purulence.  There is underlying ulceration with fat layer exposed.  Predebridement measurements limited by overlying callus.  Central area of exposed subcutaneous tissue measures 0.6 x 0.8 x 0.3 cm,  surrounding area including area of dermal tissue involvement measures 1.5 x 2 x 0.3 cm following debridement.  Some serous drainage and macerated callus present.  No undermining or tracking.  DP and PT pedal pulses intact.  Cap refill intact.  Decreased pedal hair growth.  Pretibial edema present +1 to +2 left lower extremity Light touch sensation diminished to toes.  Protective sensation absent.  Vibratory sensation decreased. Charcot ankle and hindfoot deformity present with varus hindfoot, known plantigrade deformity, midfoot not fully reducible.       Results LABS A1c: 7.5 (September 2025)  RADIOLOGY Left foot and ankle X-ray: Charcot midfoot, hindfoot and ankle changes with varus hindfoot alignment, not fully reducible. Prior left first metatarsal head resection.  Area of questionable cortical erosion fifth met base however there is not obvious bony destruction here.  There appears to be near complete talar body subsidence.  Calcaneus appears intact.  Chronic appearing destructive changes of the midfoot with Ider joint destruction.  On ankle views varus hindfoot and ankle apparent with weightbearing on the fifth met base.  Deformity does seem to have progressed since prior studies  from May 01, 2023.  (10/21/2024)  PROCEDURE Left foot excisional wound Debridement of nonviable skin subcutaneous tissue (10/21/2024) Description: Verbal consent obtained to proceed with excisional debridement of nonviable skin subcutaneous tissue overlying macerated callus.  Overlying callus limits accurate predebridement measurements.  Postdebridement  measurements 1.5 x 2.0 x 0.3 cm in total with area of subcutaneous tissue involvement 9:00 aspect measuring close to two 0.6 x 0.8 x 0.3 cm. Approximately 2 cc of blood loss noted.  Hemostasis achieved with compression.  Wound cleansed with wound cleanser.  Hydrofera Blue, 4 x 4 gauze, Kerlix, Ace wrap applied.     Assessment:   1. Charcot's joint of foot, left   2. Ulcer of left midfoot with fat layer exposed (HCC)   3. Diabetic neuropathy, painful (HCC)      Plan:  Patient was evaluated and treated and all questions answered.  Assessment and Plan Assessment & Plan Charcot arthropathy of the left foot and ankle with varus hindfoot deformity Chronic Charcot arthropathy with progressive varus hindfoot deformity causing instability and risk of ulceration and amputation. Surgery necessary to prevent complications and improve stability. Discussed risks and benefits of surgery, including potential bone infection and need for external fixation, need for major amputation.  Discussed that without surgery will have high risk of major amputation due to nonreducible deformity of the ankle and hindfoot. - Ordered MRI to assess for possible underlying bone infection. - Referred to [facility] for potential surgical intervention. - If no local surgical option, will refer to [facility] or [facility] for surgical evaluation. - Advised non-weight bearing status with crutches or knee scooter. - Provided CAM boot for stabilization. - Ordered wound care supplies - Prescribed antibiotics for one week. - Ordered culture of the wound.  Non-pressure chronic ulcer of the left midfoot Chronic ulcer secondary to Charcot arthropathy and varus deformity.  Ulceration with subcutaneous tissue involvement without obvious signs of infection with no significant infection signs, but prophylactic antibiotics considered. - Excisional debridement of the ulceration as described above - Applied Hydrofera Blue foam dressing  after cleaning with wound cleanser, 4 x 4 gauze, close, Ace wrap. - Change dressing in similar fashion every 2 to 3 days.  Reviewed offloading with patient.  Offloading felt padding applied today.  Strict nonweightbearing. - Ordered wound care supplies.  If there is difficulty with obtaining the wound care supplies, transition to daily Betadine wet-to-dry dressings. - Keflex  500 mg 3 times daily sent to patient's pharmacy for possible underlying infection as it may be degree of immunosuppression from diabetes -Course of tramadol  50 mg every 8 hours as needed for sever pain sent to patient's pharmacy - Ordered culture of the wound.  Type 2 diabetes mellitus with diabetic neuropathy Type 2 diabetes with neuropathy, decreasing sensation and increasing injury risk. A1c at 7.5 indicates suboptimal control. - Continue current diabetic medications. - Encouraged good glycemic control to aid in healing and reduce surgical risks.  Follow-up in 1 week for wound care.  Will continue to follow patient closely for wound care until he can be established for surgical consultation      Return in about 1 week (around 10/28/2024) for Wound Care.

## 2024-10-22 ENCOUNTER — Ambulatory Visit: Admitting: Podiatry

## 2024-10-22 LAB — LAB REPORT - SCANNED
A1c: 8.2
EGFR: 86

## 2024-10-24 ENCOUNTER — Ambulatory Visit (INDEPENDENT_AMBULATORY_CARE_PROVIDER_SITE_OTHER)
Admission: RE | Admit: 2024-10-24 | Discharge: 2024-10-24 | Disposition: A | Discharge status: Home / Self Care | Source: Ambulatory Visit | Attending: Podiatry | Admitting: Podiatry

## 2024-10-24 DIAGNOSIS — M14672 Charcot's joint, left ankle and foot: Secondary | ICD-10-CM

## 2024-10-24 DIAGNOSIS — M25472 Effusion, left ankle: Secondary | ICD-10-CM | POA: Diagnosis not present

## 2024-10-24 DIAGNOSIS — M609 Myositis, unspecified: Secondary | ICD-10-CM | POA: Diagnosis not present

## 2024-10-24 DIAGNOSIS — S91002A Unspecified open wound, left ankle, initial encounter: Secondary | ICD-10-CM | POA: Diagnosis not present

## 2024-10-28 ENCOUNTER — Ambulatory Visit: Admitting: Podiatry

## 2024-10-28 ENCOUNTER — Encounter: Payer: Self-pay | Admitting: Podiatry

## 2024-10-28 VITALS — BP 163/73 | HR 68 | Temp 97.3°F

## 2024-10-28 DIAGNOSIS — E11610 Type 2 diabetes mellitus with diabetic neuropathic arthropathy: Secondary | ICD-10-CM | POA: Diagnosis not present

## 2024-10-28 DIAGNOSIS — L97422 Non-pressure chronic ulcer of left heel and midfoot with fat layer exposed: Secondary | ICD-10-CM | POA: Diagnosis not present

## 2024-10-28 DIAGNOSIS — E1169 Type 2 diabetes mellitus with other specified complication: Secondary | ICD-10-CM | POA: Diagnosis not present

## 2024-10-28 MED ORDER — AMOXICILLIN-POT CLAVULANATE 875-125 MG PO TABS: 1.0000 | ORAL_TABLET | Freq: Two times a day (BID) | ORAL | 0 refills | Status: AC

## 2024-10-28 NOTE — Patient Instructions (Signed)

## 2024-10-28 NOTE — Progress Notes (Unsigned)
 Subjective:  Patient ID: James Norton., male    DOB: 07-31-1941,  MRN: [id number]  Chief Complaint  Patient presents with   Wound Check    Left lateral ulcer, is still open in the very center is open. It was covered with hydro fara, gauze, and ace. He is wearing knee high boot. The boot is helping with stability and gait.  No anti coag. A1c is about 8. Pt reported.     Discussed the use of AI scribe software for clinical note transcription with the patient, who gave verbal consent to proceed.  History of Present Illness James Norton. is an 83 year old male with Charcot foot who presents with worsening foot deformity and pain.  He is following up for left midfoot ulceration as well.  Has been applying Hydrofera Blue dressing at home.  He is accompanied by daughter today.  Here to review culture results, MRI results and further discuss plan going forward.  Has not been contacted regarding referral to [facility] for surgical consultation for limb salvage.  He has been using cam boot.      Objective:    Physical Exam EXTREMITIES: Left cuboid fifth metatarsal region plantar lateral aspect ulceration present.  Fat layer exposed.  No surrounding erythema.  No areas of fluctuance noted.  Measuring approximately 0.5 x 0.8 x 0.3 cm predebridement with fibrogranular base, postdebridement 0.7 x 1 x 0.3 cm postdebridement.    DP and PT pedal pulses intact.  Cap refill intact.  Decreased pedal hair growth.  Pretibial edema present +1 to +2 left lower extremity Light touch sensation diminished to toes.  Protective sensation absent.  Vibratory sensation decreased. Charcot ankle and hindfoot deformity present with varus hindfoot, known plantigrade deformity, midfoot not fully reducible.       Results LABS A1c: 7.5 (September 2025)  RADIOLOGY Left foot and ankle X-ray: Charcot midfoot, hindfoot and ankle changes with varus hindfoot alignment, not fully reducible. Prior left first  metatarsal head resection.  Area of questionable cortical erosion fifth met base however there is not obvious bony destruction here.  There appears to be near complete talar body subsidence.  Calcaneus appears intact.  Chronic appearing destructive changes of the midfoot with Keyes joint destruction.  On ankle views varus hindfoot and ankle apparent with weightbearing on the fifth met base.  Deformity does seem to have progressed since prior studies from May 01, 2023.  (10/21/2024)  IMPRESSION: 1. Diffuse abnormality of the foot and ankle consistent with severe neuropathic changes. 2. 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 There is only a small residual fragment which is very dark on T1 and T2 consistent with necrotic bone. 3. Associated large ankle/subtalar joint effusion with debris and probable synovitis. 4. Multiple complex irregular fluid collections highly suspicious for abscesses. 5. Diffuse abnormal marrow signal could suggest reactive changes from the Charcot foot but also I would be very suspicious of osteomyelitis and septic arthritis. 6. Diffuse subcutaneous soft tissue swelling/edema/fluid suggesting severe cellulitis. 7. Diffuse fatty atrophy of the foot and ankle musculature and areas of myositis.     Electronically Signed   By: MYRTIS Stammer M.D.   On: 10/24/2024 19:06    Assessment:   1. Charcot's joint arthropathy in type 2 diabetes mellitus (HCC)   2. Ulcer of left midfoot with fat layer exposed (HCC)      Plan:  Patient was evaluated and treated and all questions answered.  Assessment and Plan Assessment & Plan Charcot arthropathy of the  left foot and ankle with varus hindfoot deformity - MRI findings reviewed with patient, likely view the changes as reactive to the Charcot arthropathy process and do not strongly suspect acute osseous infection or presence of abscess -We will proceed with ordering CBC, CRP, sed rate - Following up on referral to Dr. Gideon  with [facility] for limb salvage surgical consultation  Non-pressure chronic ulcer of the left midfoot Chronic ulcer secondary to Charcot arthropathy and varus deformity.  Ulceration with subcutaneous tissue involvement without obvious signs of infection with no significant infection signs, but prophylactic antibiotics considered. - Excisional debridement of the ulceration of nonviable subcutaneous tissue performed with #15 blade today without incident.  Postdebridement measurements 0.7 x 1 x 0.3 cm.  Hemostasis achieved with silver  nitrate and compression.  1 to 2 cc of blood loss noted. - Applied Hydrofera Blue foam dressing after cleaning with wound cleanser, 4 x 4 gauze, close, Ace wrap. - Change dressing in similar fashion every 2 to 3 days.  Reviewed offloading with patient.  Offloading felt padding applied today.  Strict nonweightbearing. - Wound culture results reviewed, to grow Bacteroides fragilis and Finegoldia species will change course to 7 to 10 days of oral Augmentin , sent to patient's pharmacy.   Follow-up in 2 weeks for continued wound care until he can be established for limb salvage surgical consult      Return in about 2 weeks (around 11/11/2024) for Wound Care.

## 2024-10-29 LAB — CBC WITH DIFFERENTIAL/PLATELET
Basophils Absolute: 0.1 x10E3/uL (ref 0.0–0.2)
Basos: 1 %
EOS (ABSOLUTE): 0.1 x10E3/uL (ref 0.0–0.4)
Eos: 1 %
Hematocrit: 39.2 % (ref 37.5–51.0)
Hemoglobin: 12.5 g/dL — ABNORMAL LOW (ref 13.0–17.7)
Immature Grans (Abs): 0 x10E3/uL (ref 0.0–0.1)
Immature Granulocytes: 0 %
Lymphocytes Absolute: 0.9 x10E3/uL (ref 0.7–3.1)
Lymphs: 11 %
MCH: 26.8 pg (ref 26.6–33.0)
MCHC: 31.9 g/dL (ref 31.5–35.7)
MCV: 84 fL (ref 79–97)
Monocytes Absolute: 0.5 x10E3/uL (ref 0.1–0.9)
Monocytes: 6 %
Neutrophils Absolute: 6.5 x10E3/uL (ref 1.4–7.0)
Neutrophils: 81 %
Platelets: 431 x10E3/uL (ref 150–450)
RBC: 4.67 x10E6/uL (ref 4.14–5.80)
RDW: 15 % (ref 11.6–15.4)
WBC: 8.1 x10E3/uL (ref 3.4–10.8)

## 2024-10-29 LAB — SEDIMENTATION RATE: Sed Rate: 46 mm/h — ABNORMAL HIGH (ref 0–30)

## 2024-10-29 LAB — C-REACTIVE PROTEIN: CRP: 13 mg/L — ABNORMAL HIGH (ref 0–10)

## 2024-11-11 ENCOUNTER — Ambulatory Visit (INDEPENDENT_AMBULATORY_CARE_PROVIDER_SITE_OTHER): Admitting: Podiatry

## 2024-11-11 ENCOUNTER — Encounter: Payer: Self-pay | Admitting: Podiatry

## 2024-11-11 DIAGNOSIS — L97422 Non-pressure chronic ulcer of left heel and midfoot with fat layer exposed: Secondary | ICD-10-CM | POA: Diagnosis not present

## 2024-11-11 DIAGNOSIS — E11610 Type 2 diabetes mellitus with diabetic neuropathic arthropathy: Secondary | ICD-10-CM

## 2024-11-11 NOTE — Patient Instructions (Addendum)
 Instructions for Wound Care  The most important step to healing a foot wound is to reduce the pressure on your foot - it is extremely important to stay off your foot as much as possible and wear the shoe/boot as instructed.  Cleanse your foot with saline wash or warm soapy water (dial antibacterial soap or similar).  Blot dry.  Apply prescribed medication to your wound and cover with gauze and a bandage.  May hold bandage in place with Coban (self sticky wrap), Ace bandage or tape.  You may find dressing supplies at your local [employer], [employer], drug store or medical supply store.  Monitor for any signs/symptoms of infection. If there is any increase in redness, red streaks, increase in drainage, warmth to your foot please give us  a call. Also, if you start to run a fever or have flu-like symptoms that can also be a sign of infection. Call the office immediately if any occur or go directly to the emergency room.   If you have any questions, please feel free to give us  a call at [phone] or if you are on MyChart you can always send me a message if needed.   I have ordered a referral to [facility] to discuss charcot reconstruction surgery.   Dr. Velma Angst Address: [address], [place], [place] [address] Phone: [phone]  Please contact our office if there are any issues with the referral going forward.

## 2024-11-11 NOTE — Progress Notes (Signed)
 "  Subjective:  Patient ID: James Sippel., male    DOB: [birth year],  MRN: [id number]  83 y.o. male is seen today for evaluation of left plantar lateral midfoot ulceration with associated charcot deformity.  Prescribed wound care regimen: hydrofera blue dressing and secondary dressing 3-4 times weekly.  Patient is using CamWalker.  Patient relates wound has improved.  Patient The patient is having no constitutional symptoms, denying fever, chills, anorexia, or weight loss..     Medications Ordered Prior to Encounter[1]   Allergies[2]   Objective:  Physical Exam: There were no vitals filed for this visit.   James Beeks. is a pleasant 83 y.o. Caucasian male in NAD. AAO x 3.  Vascular Examination: Palpable DP and PT pulses bilaterally.  Decreased pedal hair growth.  +1+2 pitting edema present left lower extremity consistent with baseline.  No erythema or warmth noted.  Dermatological Examination: Ulceration present plantar lateral left midfoot as described below.  No other open wounds or lesions noted.    Wound Location: Left plantar lateral midfoot over cuboid/fifth met base There is a considerable amount of hemorrhagic callus and devitalized tissue present in the wound Predebridement Wound Measurement: Limited by overlying hemorrhagic callus which is approximately 1.2 x 1.2 cm in diameter Postdebridement Wound Measurement: Area of breakdown lateral to original ulceration approximately 0.6 x 0.6 x 0.2 cm, central area of ulceration 0.2 cm in length by 0.6 cm in width by 0.2 cm depth Wound Base: Granular/Healthy Peri-wound: Calloused Exudate: Scant/small amount Serous exudate Blood Loss during debridement: 2 cc('s). Sign(s) of clinical bacterial infection: no clinical signs of infection noted on examination today.  Musculoskeletal Examination: Charcot ankle and hindfoot deformity present with equinovarus hindfoot, nonplantigrade deformity, midfoot not fully  reducible. Grossly unstable  Neurological Examination: Light touch sensation diminished to toes. Protective sensation absent. Vibratory sensation decreased.   Labs:     No data to display             No results for input(s): GRAMSTAIN, LABORGA in the last 8760 hours.   Lab Results  Component Value Date   WBC 8.1 10/28/2024   HGB 12.5 (L) 10/28/2024   HCT 39.2 10/28/2024   MCV 84 10/28/2024   PLT 431 10/28/2024     No results found.  Radiographs  MR ANKLE LEFT WO CONTRAST Result Date: 10/24/2024 CLINICAL DATA:  Charcot foot. Multiple foot surgeries. Open wounds. EXAM: MRI OF THE LEFT ANKLE WITHOUT CONTRAST TECHNIQUE: Multiplanar, multisequence MR imaging of the ankle was performed. No intravenous contrast was administered. COMPARISON:  Multiple prior radiographs. FINDINGS: Diffuse abnormality of the foot and ankle consistent with severe neuropathic changes. 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 There is only a small residual fragment which is very dark on T1 and T2 consistent with necrotic bone. Associated large ankle/subtalar joint effusion with debris and probable synovitis. There are also multiple complex irregular fluid collections highly suspicious for abscesses. The largest is in the medial aspect of the hindfoot adjacent to the calcaneus and cuboid. This measures approximately 4.4 x 3.9 cm. Fluid collection also noted in the cuboid which could be a Brodie's abscess. Similar complex fluid collection in the fibula possibly Brodie's abscess. Diffuse abnormal marrow signal could suggest reactive changes from the Charcot foot but also I would be very suspicious of osteomyelitis and septic arthritis. In flung gated tubular fluid collection noted in the peroneus muscle. Uncertain etiology. It appears to be 2 simple could be a elongated area of pyomyositis. Difficult to  evaluate the ligaments and tendons but the peroneus longus tendon is either dislocated or has been transposed and the  posterior tibialis tendon appears chronically torn. Diffuse subcutaneous soft tissue swelling/edema/fluid suggesting severe cellulitis. Diffuse fatty atrophy of the foot and ankle musculature and areas of myositis. IMPRESSION: 1. Diffuse abnormality of the foot and ankle consistent with severe neuropathic changes. 2. 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 There is only a small residual fragment which is very dark on T1 and T2 consistent with necrotic bone. 3. Associated large ankle/subtalar joint effusion with debris and probable synovitis. 4. Multiple complex irregular fluid collections highly suspicious for abscesses. 5. Diffuse abnormal marrow signal could suggest reactive changes from the Charcot foot but also I would be very suspicious of osteomyelitis and septic arthritis. 6. Diffuse subcutaneous soft tissue swelling/edema/fluid suggesting severe cellulitis. 7. Diffuse fatty atrophy of the foot and ankle musculature and areas of myositis. Electronically Signed   By: MYRTIS Stammer M.D.   On: 10/24/2024 19:06     Assessment:  1. Ulcer of left midfoot with fat layer exposed (HCC) (Primary) -improving overall, discussed importance of limited weightbearing and use of the CAM boot  2. Charcot's joint arthropathy in type 2 diabetes mellitus [facility]) -Patient states has not heard anything regarding prior referral, gave patient information for [facility] to reach out for appointment -CBC, CRP and Sed rate reviewed, no white count.  Believe elevation of inflammatory markers are secondary to Charcot process -Previously reviewed MRI with patient at last visit, favor reactive process as well.   Plan:  -Orders on today's visit: No orders of the defined types were placed in this encounter.   -Patient was evaluated and treated and all questions answered.  -Patient/POA/Family member educated on diagnosis and treatment plan of routine ulcer debridement/wound care.  -Ulceration debridement achieved  utilizing sharp excisional debridement with sterile scalpel blade.. Type/amount of devitalized tissue removed: Significant hemorrhagic callus, underlying fibrotic tissue -Today's ulcer size post-debridement: Area of breakdown lateral to original ulceration approximately 0.6 x 0.6 x 0.2 cm with exposed subcutaneous tissue, central area of ulceration 0.2 cm in length by 0.6 cm in width by 0.2 cm depth exposed subcutaneous tissue -Ulceration cleansed with wound cleanser. Hydrofera Blue applied to base of ulceration and secured with light dressing. -Wound responded well to today's debridement. -Patient risk factors affecting healing of ulcer: diabetic neuropathy, history of Charcot joint left ankle and subtalar joint left foot -James Norton. given written instructions on daily wound care for left foot ulceration. -Continue dressing changes with Hydrofera Blue every 2 to 3 days.  Has completed course of oral Augmentin .  Will monitor off antibiotics. -Frequency of debridements needed to achieve healing: Every 2 to 3 weeks -Consultations ordered today: Previously ordered consultation for Charcot reconstruction with [facility].  Gave patient office information for specialist to contact as he has not heard anything at this point. - Will continue to see patient for wound care until he is established with surgeon for limb salvage.  Did discuss that ultimately he will need surgery to stabilize and correct the deformity or else he will continue to deal with reulceration and risk of major amputation. Return in about 2 weeks (around 11/25/2024) for Wound Care.  James Norton, DPM      [place] LOCATION: [address]  [place], [place] [address]                   Office [phone]   [place] LOCATION: [address], [address] [place], [place] [address] Office [phone]      [1]  Current Outpatient Medications on File Prior to  Visit  Medication Sig Dispense Refill   amLODipine (NORVASC) 5 MG tablet      amoxicillin -clavulanate (AUGMENTIN ) 875-125 MG tablet Take 1 tablet by mouth 2 (two) times daily. 20 tablet 0   atorvastatin (LIPITOR) 80 MG tablet      Cyanocobalamin  (B-12 COMPLIANCE INJECTION IJ) Inject as directed.     ferrous sulfate 325 (65 FE) MG EC tablet Take 325 mg by mouth in the morning and at bedtime.     finasteride (PROSCAR) 5 MG tablet Take 5 mg by mouth daily.     ibuprofen (ADVIL) 800 MG tablet Take 800 mg by mouth 3 (three) times daily.     insulin degludec (TRESIBA FLEXTOUCH) 100 UNIT/ML FlexTouch Pen Inject 45 Units into the skin at bedtime.     lisinopril (ZESTRIL) 20 MG tablet      metFORMIN (GLUCOPHAGE-XR) 500 MG 24 hr tablet Take 500 mg by mouth 2 (two) times daily with a meal.     omeprazole (PRILOSEC) 40 MG capsule Take 40 mg by mouth daily.     tamsulosin (FLOMAX) 0.4 MG CAPS capsule Take 0.4 mg by mouth daily.     traMADol  (ULTRAM ) 50 MG tablet Take 1 tablet (50 mg total) by mouth every 8 (eight) hours as needed for up to 20 doses. 20 tablet 0   No current facility-administered medications on file prior to visit.  [2] No Known Allergies  "

## 2024-11-26 ENCOUNTER — Ambulatory Visit: Admitting: Podiatry

## 2024-12-13 ENCOUNTER — Telehealth: Payer: Self-pay | Admitting: Urology

## 2024-12-13 NOTE — Telephone Encounter (Signed)
 Pt wants to know if he can get a refill on the amoxicillin -clavulanate (AUGMENTIN ) , he doesn't fill like he has an active infection, he just wants it just in case.  Pharmacy - [facility] - [place], [place] - [address]  Pt call back # [phone]
